# Patient Record
Sex: Male | Born: 1970
Health system: Southern US, Community
[De-identification: ages and names within clinical notes are randomized; demographics above are authoritative.]

## PROBLEM LIST (undated history)

## (undated) DIAGNOSIS — I639 Cerebral infarction, unspecified: Secondary | ICD-10-CM

## (undated) DIAGNOSIS — K219 Gastro-esophageal reflux disease without esophagitis: Secondary | ICD-10-CM

## (undated) DIAGNOSIS — G510 Bell's palsy: Secondary | ICD-10-CM

## (undated) DIAGNOSIS — I10 Essential (primary) hypertension: Secondary | ICD-10-CM

## (undated) DIAGNOSIS — F32A Depression, unspecified: Secondary | ICD-10-CM

## (undated) DIAGNOSIS — R29898 Other symptoms and signs involving the musculoskeletal system: Secondary | ICD-10-CM

## (undated) DIAGNOSIS — D649 Anemia, unspecified: Secondary | ICD-10-CM

## (undated) DIAGNOSIS — E785 Hyperlipidemia, unspecified: Secondary | ICD-10-CM

## (undated) DIAGNOSIS — R000 Tachycardia, unspecified: Secondary | ICD-10-CM

## (undated) DIAGNOSIS — G4700 Insomnia, unspecified: Secondary | ICD-10-CM

## (undated) DIAGNOSIS — F419 Anxiety disorder, unspecified: Secondary | ICD-10-CM

## (undated) DIAGNOSIS — M1990 Unspecified osteoarthritis, unspecified site: Secondary | ICD-10-CM

## (undated) DIAGNOSIS — I69354 Hemiplegia and hemiparesis following cerebral infarction affecting left non-dominant side: Secondary | ICD-10-CM

## (undated) DIAGNOSIS — N3281 Overactive bladder: Secondary | ICD-10-CM

## (undated) DIAGNOSIS — G250 Essential tremor: Secondary | ICD-10-CM

## (undated) DIAGNOSIS — R1310 Dysphagia, unspecified: Secondary | ICD-10-CM

## (undated) DIAGNOSIS — G8929 Other chronic pain: Secondary | ICD-10-CM

## (undated) DIAGNOSIS — G40909 Epilepsy, unspecified, not intractable, without status epilepticus: Secondary | ICD-10-CM

## (undated) DIAGNOSIS — G629 Polyneuropathy, unspecified: Secondary | ICD-10-CM

## (undated) DIAGNOSIS — Z87898 Personal history of other specified conditions: Secondary | ICD-10-CM

## (undated) DIAGNOSIS — N400 Enlarged prostate without lower urinary tract symptoms: Secondary | ICD-10-CM

## (undated) HISTORY — PX: OTHER SURGICAL HISTORY: SHX169

---

## 2021-01-14 ENCOUNTER — Ambulatory Visit (INDEPENDENT_AMBULATORY_CARE_PROVIDER_SITE_OTHER): Payer: Medicare Other | Admitting: Nurse Practitioner

## 2021-01-14 ENCOUNTER — Emergency Department (HOSPITAL_COMMUNITY)
Admission: EM | Admit: 2021-01-14 | Discharge: 2021-01-14 | Disposition: A | Discharge status: Home / Self Care | Payer: Medicare Other | Attending: Emergency Medicine | Admitting: Emergency Medicine

## 2021-01-14 ENCOUNTER — Emergency Department (HOSPITAL_COMMUNITY): Payer: Medicare Other

## 2021-01-14 ENCOUNTER — Encounter: Payer: Self-pay | Admitting: Nurse Practitioner

## 2021-01-14 ENCOUNTER — Encounter (HOSPITAL_COMMUNITY): Payer: Self-pay | Admitting: Emergency Medicine

## 2021-01-14 ENCOUNTER — Other Ambulatory Visit: Payer: Self-pay

## 2021-01-14 DIAGNOSIS — F17200 Nicotine dependence, unspecified, uncomplicated: Secondary | ICD-10-CM | POA: Insufficient documentation

## 2021-01-14 DIAGNOSIS — J3489 Other specified disorders of nose and nasal sinuses: Secondary | ICD-10-CM

## 2021-01-14 DIAGNOSIS — Z7689 Persons encountering health services in other specified circumstances: Secondary | ICD-10-CM | POA: Diagnosis not present

## 2021-01-14 DIAGNOSIS — S161XXA Strain of muscle, fascia and tendon at neck level, initial encounter: Secondary | ICD-10-CM | POA: Diagnosis not present

## 2021-01-14 DIAGNOSIS — I639 Cerebral infarction, unspecified: Secondary | ICD-10-CM

## 2021-01-14 DIAGNOSIS — Z139 Encounter for screening, unspecified: Secondary | ICD-10-CM | POA: Insufficient documentation

## 2021-01-14 DIAGNOSIS — W228XXA Striking against or struck by other objects, initial encounter: Secondary | ICD-10-CM | POA: Insufficient documentation

## 2021-01-14 DIAGNOSIS — G8929 Other chronic pain: Secondary | ICD-10-CM

## 2021-01-14 DIAGNOSIS — G40909 Epilepsy, unspecified, not intractable, without status epilepticus: Secondary | ICD-10-CM | POA: Diagnosis not present

## 2021-01-14 DIAGNOSIS — R269 Unspecified abnormalities of gait and mobility: Secondary | ICD-10-CM | POA: Insufficient documentation

## 2021-01-14 DIAGNOSIS — Z0001 Encounter for general adult medical examination with abnormal findings: Secondary | ICD-10-CM | POA: Insufficient documentation

## 2021-01-14 DIAGNOSIS — M25562 Pain in left knee: Secondary | ICD-10-CM

## 2021-01-14 DIAGNOSIS — S0093XA Contusion of unspecified part of head, initial encounter: Secondary | ICD-10-CM | POA: Insufficient documentation

## 2021-01-14 DIAGNOSIS — Y92009 Unspecified place in unspecified non-institutional (private) residence as the place of occurrence of the external cause: Secondary | ICD-10-CM | POA: Insufficient documentation

## 2021-01-14 DIAGNOSIS — S0990XA Unspecified injury of head, initial encounter: Secondary | ICD-10-CM | POA: Diagnosis not present

## 2021-01-14 DIAGNOSIS — S199XXA Unspecified injury of neck, initial encounter: Secondary | ICD-10-CM | POA: Diagnosis present

## 2021-01-14 DIAGNOSIS — R079 Chest pain, unspecified: Secondary | ICD-10-CM

## 2021-01-14 HISTORY — DX: Cerebral infarction, unspecified: I63.9

## 2021-01-14 MED ORDER — OXYCODONE-ACETAMINOPHEN 5-325 MG PO TABS
1.0000 | ORAL_TABLET | Freq: Once | ORAL | Status: AC
Start: 2021-01-14 — End: 2021-01-14
  Administered 2021-01-14: 1 via ORAL
  Filled 2021-01-14: qty 1

## 2021-01-14 NOTE — Assessment & Plan Note (Signed)
-  chronic rhinorrhea sniffing following brain surgery -he is requesting ENT referral to r/o mechanical etiology

## 2021-01-14 NOTE — Patient Instructions (Addendum)
Please have fasting labs drawn 2-3 days prior to your appointment so we can discuss the results during your office visit.  Please bring all medications to your next appointment for a reconciliation.

## 2021-01-14 NOTE — Assessment & Plan Note (Signed)
-  screen HCV and HIV with next set of labs

## 2021-01-14 NOTE — Assessment & Plan Note (Signed)
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
## 2021-01-14 NOTE — ED Notes (Signed)
Patient transported to CT 

## 2021-01-14 NOTE — Assessment & Plan Note (Signed)
-  no acute issues today -states he has angina every other day -referral to cardiology

## 2021-01-14 NOTE — Addendum Note (Signed)
Addended by: Demetrius Revel on: 01/14/2021 02:50 PM   Modules accepted: Orders

## 2021-01-14 NOTE — Assessment & Plan Note (Signed)
-  obtain records 

## 2021-01-14 NOTE — Assessment & Plan Note (Signed)
-  has contractures related to CVA -has pain to left knee and is requesting ortho referral
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
## 2021-01-14 NOTE — Assessment & Plan Note (Addendum)
-  no meds reported today; will need to bring meds for reconciliation -referral to neuro
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
## 2021-01-15 ENCOUNTER — Other Ambulatory Visit: Payer: Self-pay

## 2021-01-15 ENCOUNTER — Telehealth: Payer: Self-pay

## 2021-01-15 DIAGNOSIS — T402X5A Adverse effect of other opioids, initial encounter: Secondary | ICD-10-CM

## 2021-01-15 DIAGNOSIS — I152 Hypertension secondary to endocrine disorders: Secondary | ICD-10-CM

## 2021-01-15 DIAGNOSIS — F419 Anxiety disorder, unspecified: Secondary | ICD-10-CM

## 2021-01-15 DIAGNOSIS — G8929 Other chronic pain: Secondary | ICD-10-CM

## 2021-01-15 DIAGNOSIS — G40909 Epilepsy, unspecified, not intractable, without status epilepticus: Secondary | ICD-10-CM

## 2021-01-15 DIAGNOSIS — G4700 Insomnia, unspecified: Secondary | ICD-10-CM

## 2021-01-15 DIAGNOSIS — K5903 Drug induced constipation: Secondary | ICD-10-CM

## 2021-01-15 DIAGNOSIS — J3489 Other specified disorders of nose and nasal sinuses: Secondary | ICD-10-CM

## 2021-01-15 DIAGNOSIS — R110 Nausea: Secondary | ICD-10-CM

## 2021-01-15 DIAGNOSIS — K219 Gastro-esophageal reflux disease without esophagitis: Secondary | ICD-10-CM

## 2021-01-15 DIAGNOSIS — I639 Cerebral infarction, unspecified: Secondary | ICD-10-CM

## 2021-01-15 DIAGNOSIS — E1159 Type 2 diabetes mellitus with other circulatory complications: Secondary | ICD-10-CM

## 2021-01-15 MED ORDER — PROMETHAZINE HCL 12.5 MG PO TABS: 12.5000 mg | ORAL_TABLET | Freq: Three times a day (TID) | ORAL | 0 refills | Status: DC | PRN

## 2021-01-15 MED ORDER — LEVETIRACETAM 750 MG PO TABS: 750.0000 mg | ORAL_TABLET | Freq: Two times a day (BID) | ORAL | 3 refills | Status: DC

## 2021-01-15 MED ORDER — GABAPENTIN 100 MG PO CAPS: 100.0000 mg | ORAL_CAPSULE | Freq: Every day | ORAL | 3 refills | Status: DC

## 2021-01-15 MED ORDER — HYDROCHLOROTHIAZIDE 25 MG PO TABS: 25.0000 mg | ORAL_TABLET | Freq: Every day | ORAL | 3 refills | Status: DC

## 2021-01-15 MED ORDER — BUSPIRONE HCL 5 MG PO TABS: 5.0000 mg | ORAL_TABLET | Freq: Two times a day (BID) | ORAL | 3 refills | Status: DC

## 2021-01-15 MED ORDER — OMEPRAZOLE 20 MG PO CPDR: 20.0000 mg | DELAYED_RELEASE_CAPSULE | Freq: Every day | ORAL | 3 refills | Status: DC

## 2021-01-15 MED ORDER — HYDROXYZINE HCL 25 MG PO TABS: 25.0000 mg | ORAL_TABLET | Freq: Three times a day (TID) | ORAL | 3 refills | Status: DC

## 2021-01-15 MED ORDER — CELECOXIB 200 MG PO CAPS: 200.0000 mg | ORAL_CAPSULE | Freq: Every day | ORAL | 3 refills | Status: DC

## 2021-01-15 MED ORDER — BACLOFEN 10 MG PO TABS: 10.0000 mg | ORAL_TABLET | Freq: Three times a day (TID) | ORAL | 3 refills | Status: DC

## 2021-01-15 MED ORDER — DOCUSATE SODIUM 100 MG PO CAPS: 100.0000 mg | ORAL_CAPSULE | Freq: Every day | ORAL | 3 refills | Status: DC

## 2021-01-15 MED ORDER — TRAZODONE HCL 150 MG PO TABS: 150.0000 mg | ORAL_TABLET | Freq: Every day | ORAL | 3 refills | Status: DC

## 2021-01-15 MED ORDER — CETIRIZINE HCL 10 MG PO TABS: 10.0000 mg | ORAL_TABLET | Freq: Every day | ORAL | 3 refills | Status: DC

## 2021-01-15 NOTE — Telephone Encounter (Signed)
All rx's re-sent to [facility] including baclofen and phenergan. Informed pt brother of the pain management referral.

## 2021-01-15 NOTE — Telephone Encounter (Signed)
We use pain management for narcotic prescriptions, so I'll send in the referral for that.  What is the frequency of the baclofen and phenergan as well as the phenergan dosage?

## 2021-01-15 NOTE — Addendum Note (Signed)
Addended by: Demetrius Revel on: 01/15/2021 04:28 PM   Modules accepted: Orders

## 2021-01-15 NOTE — Telephone Encounter (Signed)
Patient brother called patient need med refills, none was given yesterday.  HYDROcodone-acetaminophen (NORCO/VICODIN) 5-325 MG tablet  baclofen (LIORESAL) 10 MG tablet  Phenergan  For nausea  Pharmacy: [facility]

## 2021-01-15 NOTE — Telephone Encounter (Signed)
All rx's re-sent to [facility]. Pt brother informed.

## 2021-01-15 NOTE — Telephone Encounter (Signed)
Patient brother called need more med refill  traZODone (DESYREL) 150 MG tablet   busPIRone (BUSPAR) 5 MG tablet   celecoxib (CELEBREX) 200 MG capsule   gabapentin (NEURONTIN) 100 MG capsule  levETIRAcetam (KEPPRA) 750 MG tablet   omeprazole (PRILOSEC) 20 MG capsule   hydrOXYzine (ATARAX/VISTARIL) 25 MG tablet   atorvastatin (LIPITOR) 40 MG tablet  docusate sodium (COLACE) 100 MG capsule  cetirizine (ZYRTEC) 10 MG tablet   Pharmarcy:  [place]

## 2021-01-18 ENCOUNTER — Ambulatory Visit
Admission: EM | Admit: 2021-01-18 | Discharge: 2021-01-18 | Discharge status: Against Medical Advice | Payer: Medicare Other

## 2021-01-18 ENCOUNTER — Other Ambulatory Visit: Payer: Self-pay

## 2021-01-28 ENCOUNTER — Encounter: Payer: Self-pay | Admitting: Physical Medicine & Rehabilitation

## 2021-01-29 DIAGNOSIS — Z139 Encounter for screening, unspecified: Secondary | ICD-10-CM | POA: Diagnosis not present

## 2021-01-29 DIAGNOSIS — Z7689 Persons encountering health services in other specified circumstances: Secondary | ICD-10-CM | POA: Diagnosis not present

## 2021-01-29 DIAGNOSIS — I639 Cerebral infarction, unspecified: Secondary | ICD-10-CM | POA: Diagnosis not present

## 2021-01-29 DIAGNOSIS — R079 Chest pain, unspecified: Secondary | ICD-10-CM | POA: Diagnosis not present

## 2021-01-30 LAB — CBC WITH DIFFERENTIAL/PLATELET
Basophils Absolute: 0 10*3/uL (ref 0.0–0.2)
Basos: 1 %
EOS (ABSOLUTE): 0.1 10*3/uL (ref 0.0–0.4)
Eos: 1 %
Hematocrit: 44.1 % (ref 37.5–51.0)
Hemoglobin: 14.7 g/dL (ref 13.0–17.7)
Immature Grans (Abs): 0 10*3/uL (ref 0.0–0.1)
Immature Granulocytes: 0 %
Lymphocytes Absolute: 2.2 10*3/uL (ref 0.7–3.1)
Lymphs: 29 %
MCH: 28.3 pg (ref 26.6–33.0)
MCHC: 33.3 g/dL (ref 31.5–35.7)
MCV: 85 fL (ref 79–97)
Monocytes Absolute: 0.6 10*3/uL (ref 0.1–0.9)
Monocytes: 8 %
Neutrophils Absolute: 4.7 10*3/uL (ref 1.4–7.0)
Neutrophils: 61 %
Platelets: 320 10*3/uL (ref 150–450)
RBC: 5.19 x10E6/uL (ref 4.14–5.80)
RDW: 13.9 % (ref 11.6–15.4)
WBC: 7.7 10*3/uL (ref 3.4–10.8)

## 2021-01-30 LAB — CMP14+EGFR
ALT: 14 IU/L (ref 0–44)
AST: 19 IU/L (ref 0–40)
Albumin/Globulin Ratio: 1.7 (ref 1.2–2.2)
Albumin: 4.5 g/dL (ref 3.8–4.9)
Alkaline Phosphatase: 138 IU/L — ABNORMAL HIGH (ref 44–121)
BUN/Creatinine Ratio: 16 (ref 9–20)
BUN: 12 mg/dL (ref 6–24)
Bilirubin Total: 0.3 mg/dL (ref 0.0–1.2)
CO2: 26 mmol/L (ref 20–29)
Calcium: 9.3 mg/dL (ref 8.7–10.2)
Chloride: 99 mmol/L (ref 96–106)
Creatinine, Ser: 0.75 mg/dL — ABNORMAL LOW (ref 0.76–1.27)
Globulin, Total: 2.7 g/dL (ref 1.5–4.5)
Glucose: 90 mg/dL (ref 65–99)
Potassium: 3.4 mmol/L — ABNORMAL LOW (ref 3.5–5.2)
Sodium: 141 mmol/L (ref 134–144)
Total Protein: 7.2 g/dL (ref 6.0–8.5)
eGFR: 109 mL/min/{1.73_m2} (ref >59)

## 2021-01-30 LAB — LIPID PANEL WITH LDL/HDL RATIO
Cholesterol, Total: 169 mg/dL (ref 100–199)
HDL: 40 mg/dL (ref >39)
LDL Chol Calc (NIH): 94 mg/dL (ref 0–99)
LDL/HDL Ratio: 2.4 ratio (ref 0.0–3.6)
Triglycerides: 202 mg/dL — ABNORMAL HIGH (ref 0–149)
VLDL Cholesterol Cal: 35 mg/dL (ref 5–40)

## 2021-01-30 LAB — HEPATITIS C ANTIBODY: Hep C Virus Ab: <0.1 s/co ratio (ref 0.0–0.9)

## 2021-01-30 NOTE — Progress Notes (Signed)
Potassium is a little low, and triglycerides are a little high, but we will discuss those at his appointment on 6/13.

## 2021-02-02 ENCOUNTER — Encounter: Payer: Self-pay | Admitting: Nurse Practitioner

## 2021-02-02 ENCOUNTER — Ambulatory Visit (INDEPENDENT_AMBULATORY_CARE_PROVIDER_SITE_OTHER): Payer: Medicare Other | Admitting: Nurse Practitioner

## 2021-02-02 ENCOUNTER — Other Ambulatory Visit: Payer: Self-pay

## 2021-02-02 VITALS — BP 156/93 | HR 102 | Temp 98.5°F | Resp 22 | Ht 74.0 in | Wt 229.0 lb

## 2021-02-02 DIAGNOSIS — K219 Gastro-esophageal reflux disease without esophagitis: Secondary | ICD-10-CM | POA: Diagnosis not present

## 2021-02-02 DIAGNOSIS — I639 Cerebral infarction, unspecified: Secondary | ICD-10-CM | POA: Diagnosis not present

## 2021-02-02 DIAGNOSIS — R21 Rash and other nonspecific skin eruption: Secondary | ICD-10-CM

## 2021-02-02 DIAGNOSIS — R269 Unspecified abnormalities of gait and mobility: Secondary | ICD-10-CM | POA: Diagnosis not present

## 2021-02-02 DIAGNOSIS — Z0001 Encounter for general adult medical examination with abnormal findings: Secondary | ICD-10-CM | POA: Diagnosis not present

## 2021-02-02 DIAGNOSIS — G8929 Other chronic pain: Secondary | ICD-10-CM | POA: Diagnosis not present

## 2021-02-02 DIAGNOSIS — G40909 Epilepsy, unspecified, not intractable, without status epilepticus: Secondary | ICD-10-CM

## 2021-02-02 DIAGNOSIS — G894 Chronic pain syndrome: Secondary | ICD-10-CM | POA: Diagnosis not present

## 2021-02-02 DIAGNOSIS — M25562 Pain in left knee: Secondary | ICD-10-CM

## 2021-02-02 DIAGNOSIS — Z139 Encounter for screening, unspecified: Secondary | ICD-10-CM | POA: Diagnosis not present

## 2021-02-02 MED ORDER — TRIAMCINOLONE ACETONIDE 0.1 % EX CREA: TOPICAL_CREAM | Freq: Two times a day (BID) | CUTANEOUS | 1 refills | Status: DC | PRN

## 2021-02-02 MED ORDER — BACLOFEN 10 MG PO TABS: 10.0000 mg | ORAL_TABLET | Freq: Three times a day (TID) | ORAL | 3 refills | Status: DC

## 2021-02-02 MED ORDER — OMEPRAZOLE 20 MG PO CPDR: 20.0000 mg | DELAYED_RELEASE_CAPSULE | Freq: Every day | ORAL | 3 refills | Status: DC

## 2021-02-02 NOTE — Assessment & Plan Note (Signed)
-  has itchy rash to left neck and back of neck -also had scaling rash to feet that have improved with his brother's OTC anti-fungal cream -Rx. Triamcinolone-ketoconazole cream
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
## 2021-02-02 NOTE — Assessment & Plan Note (Signed)
-  takes keppra -referred to neuro at last OV

## 2021-02-02 NOTE — Assessment & Plan Note (Signed)
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

## 2021-02-02 NOTE — Assessment & Plan Note (Signed)
-  he has paperwork for [facility] aid
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
## 2021-02-02 NOTE — Assessment & Plan Note (Signed)
-  left sided weakness after CVA -has left facial droop, and left arm and leg weakness post CVA

## 2021-02-02 NOTE — Patient Instructions (Signed)
We will meet up in a week to discuss the forms for the home health aid.  We will meet up for labs in 4 months.  Please have fasting labs drawn 2-3 days prior to your appointment so we can discuss the results during your office visit.

## 2021-02-04 ENCOUNTER — Ambulatory Visit: Payer: Medicare Other

## 2021-02-04 ENCOUNTER — Other Ambulatory Visit: Payer: Self-pay | Admitting: Orthopedic Surgery

## 2021-02-04 ENCOUNTER — Other Ambulatory Visit: Payer: Self-pay

## 2021-02-04 ENCOUNTER — Encounter: Payer: Self-pay | Admitting: Orthopedic Surgery

## 2021-02-04 ENCOUNTER — Telehealth: Payer: Self-pay

## 2021-02-04 ENCOUNTER — Ambulatory Visit: Payer: Medicare Other | Admitting: Orthopedic Surgery

## 2021-02-04 ENCOUNTER — Encounter: Payer: Self-pay | Admitting: Internal Medicine

## 2021-02-04 VITALS — BP 136/98 | HR 93 | Ht 74.0 in | Wt 229.0 lb

## 2021-02-04 DIAGNOSIS — G8929 Other chronic pain: Secondary | ICD-10-CM | POA: Diagnosis not present

## 2021-02-04 DIAGNOSIS — K219 Gastro-esophageal reflux disease without esophagitis: Secondary | ICD-10-CM

## 2021-02-04 DIAGNOSIS — M25562 Pain in left knee: Secondary | ICD-10-CM

## 2021-02-04 DIAGNOSIS — R269 Unspecified abnormalities of gait and mobility: Secondary | ICD-10-CM

## 2021-02-04 DIAGNOSIS — R21 Rash and other nonspecific skin eruption: Secondary | ICD-10-CM

## 2021-02-04 DIAGNOSIS — G894 Chronic pain syndrome: Secondary | ICD-10-CM

## 2021-02-04 MED ORDER — BACLOFEN 10 MG PO TABS: 10.0000 mg | ORAL_TABLET | Freq: Three times a day (TID) | ORAL | 3 refills | Status: DC

## 2021-02-04 MED ORDER — OMEPRAZOLE 20 MG PO CPDR: 20.0000 mg | DELAYED_RELEASE_CAPSULE | Freq: Every day | ORAL | 3 refills | Status: DC

## 2021-02-04 MED ORDER — TRIAMCINOLONE ACETONIDE 0.1 % EX CREA: TOPICAL_CREAM | Freq: Two times a day (BID) | CUTANEOUS | 1 refills | Status: DC | PRN

## 2021-02-04 NOTE — Progress Notes (Signed)
New Patient Visit  Assessment: Roger Morton is a 50 y.o. male with the following: 1. Chronic pain of left knee; left knee flexion contracture associated with history of stroke (x3)  Plan: Ms. Jaskiewicz has a complex medical history.  He is got limited mobility in his left side due to the strokes.  He states he has flexion contractures of both his left hip and his left knee.  He does walk with the assistance of a walker.  He states his biggest complaint at this time is pain in the left knee.  As result, I offered him a steroid injection and he is elected to proceed.  We have also placed a referral for physical therapy in an attempt to improve his mobility, and maintain range of motion of his hip and his knee.  Depending on his response to the left knee steroid injection, we could consider a left hip injection, but this would most likely be coordinated through the hospital since an image guided injection.  He stated his understanding.  All questions were answered and he is amenable to plan.  Follow-up as needed.   Procedure note injection Left knee joint   Verbal consent was obtained to inject the left knee joint  Timeout was completed to confirm the site of injection.  The skin was prepped with alcohol and ethyl chloride was sprayed at the injection site.  A 21-gauge needle was used to inject 40 mg of Depo-Medrol and 1% lidocaine (3 cc) into the left knee using an anterolateral approach.  There were no complications. A sterile bandage was applied.     Follow-up: Return if symptoms worsen or fail to improve.  Subjective:  Chief Complaint  Patient presents with   Knee Pain    Lt knee "contracture"    Hip Pain    Left side hip     History of Present Illness: Roger Morton is a 50 y.o. male who has been referred by Demetrius Revel, NP for evaluation of left knee pain.  Briefly, he has multiple medical comorbidities, and has had 3 prior strokes.  This is left him with deficits in the  left side.  Specifically, he has contractures of both the left hip, as well as the left knee.  As result, he has pain in both these areas specifically, he is complaining of pain in his left knee.  He has been on hydrocodone for many years, but due to a recent move to the area, he has not been established with a pain medicine provider.  He is now living with his brother, whereas he was previously staying in a nursing facility in [place].  No specific injury to the left knee.  He has not noticed any recent swelling.   Review of Systems: No fevers or chills No numbness or tingling No chest pain No shortness of breath No bowel or bladder dysfunction No GI distress No headaches   Medical History:  Past Medical History:  Diagnosis Date   Stroke [facility])    had brain tumor, and had CVAs during operation for his brain tumor    Past Surgical History:  Procedure Laterality Date   BRAIN SURGERY  2013   brain tumor excision   shunt intracranial x2      History reviewed. No pertinent family history. Social History   Tobacco Use   Smoking status: Every Day    Packs/day: 1.50    Years: 25.00    Pack years: 37.50    Types: Cigarettes  Smokeless tobacco: Never  Vaping Use   Vaping Use: Some days   Substances: Nicotine  Substance Use Topics   Alcohol use: Not Currently   Drug use: Not Currently    Allergies  Allergen Reactions   Penicillins     Current Meds  Medication Sig   atorvastatin (LIPITOR) 40 MG tablet Take 40 mg by mouth daily.   baclofen (LIORESAL) 10 MG tablet Take 1 tablet (10 mg total) by mouth 3 (three) times daily.   busPIRone (BUSPAR) 5 MG tablet Take 1 tablet (5 mg total) by mouth 2 (two) times daily.   celecoxib (CELEBREX) 200 MG capsule Take 1 capsule (200 mg total) by mouth daily.   cetirizine (ZYRTEC) 10 MG tablet Take 1 tablet (10 mg total) by mouth daily.   docusate sodium (COLACE) 100 MG capsule Take 1 capsule (100 mg total) by mouth daily.   gabapentin  (NEURONTIN) 100 MG capsule Take 1 capsule (100 mg total) by mouth at bedtime.   hydrochlorothiazide (HYDRODIURIL) 25 MG tablet Take 1 tablet (25 mg total) by mouth daily.   HYDROcodone-acetaminophen (NORCO/VICODIN) 5-325 MG tablet Take 1 tablet by mouth every 4 (four) hours as needed for severe pain.   hydrOXYzine (ATARAX/VISTARIL) 25 MG tablet Take 1 tablet (25 mg total) by mouth 3 (three) times daily.   ketoconazole 2%-triamcinolone 0.1% 1:2 cream mixture Apply topically 2 (two) times daily as needed.   levETIRAcetam (KEPPRA) 750 MG tablet Take 1 tablet (750 mg total) by mouth 2 (two) times daily.   omeprazole (PRILOSEC) 20 MG capsule Take 1 capsule (20 mg total) by mouth daily.   promethazine (PHENERGAN) 12.5 MG tablet Take 1 tablet (12.5 mg total) by mouth every 8 (eight) hours as needed for nausea or vomiting.   traZODone (DESYREL) 150 MG tablet Take 1 tablet (150 mg total) by mouth at bedtime.    Objective: BP (!) 136/98   Pulse 93   Ht 6\' 2"  (1.88 m)   Wt 229 lb (103.9 kg)   BMI 29.40 kg/m   Physical Exam:  General: Alert and oriented.  No acute distress.  Seated in wheelchair.  Evaluation of left leg demonstrates no swelling.  He is able to extend his leg, with an approximately 15 degree flexion contracture of the knee.  Limited passive motion of the left hip.  Mild effusion is appreciated in his knee.  Tenderness palpation along both the medial lateral joint line.  No overlying skin changes.  No bruising is appreciated around the knee.  No increased laxity to varus or valgus stress.  Negative Lachman.  Sensation is intact distally.  Active motion intact in the EHL/TA.  Atrophy of the quadriceps.    IMAGING: I personally ordered and reviewed the following images  X-rays left knee were obtained in clinic today and demonstrates no acute injuries.  Well-maintained joint spaces.  Flexion contractures evident on the x-rays.  Calcified matrix in the distal femur.  Impression: Normal  left knee x-ray.  Minimal degenerative changes.   New Medications:  No orders of the defined types were placed in this encounter.     Mordecai Rasmussen, MD  02/04/2021 12:14 PM

## 2021-02-04 NOTE — Telephone Encounter (Signed)
Patient brother called said the prescription was sent in to the wrong pharmacy. Please send to [facility] on [address] [place]  baclofen (LIORESAL) 10 MG tablet  omeprazole (PRILOSEC) 20 MG capsule   ketoconazole 2%-triamcinolone 0.1% 1:2 cream mixture

## 2021-02-04 NOTE — Patient Instructions (Signed)

## 2021-02-04 NOTE — Telephone Encounter (Signed)
Please refill these  

## 2021-02-04 NOTE — Telephone Encounter (Signed)
Rx's refilled. 

## 2021-02-09 ENCOUNTER — Telehealth: Payer: Self-pay

## 2021-02-09 ENCOUNTER — Ambulatory Visit (INDEPENDENT_AMBULATORY_CARE_PROVIDER_SITE_OTHER): Payer: Medicare Other | Admitting: Nurse Practitioner

## 2021-02-09 ENCOUNTER — Encounter: Payer: Self-pay | Admitting: Nurse Practitioner

## 2021-02-09 ENCOUNTER — Ambulatory Visit: Payer: Self-pay | Admitting: Nurse Practitioner

## 2021-02-09 ENCOUNTER — Other Ambulatory Visit: Payer: Self-pay

## 2021-02-09 ENCOUNTER — Other Ambulatory Visit: Payer: Self-pay | Admitting: Nurse Practitioner

## 2021-02-09 VITALS — BP 124/85 | HR 88 | Temp 97.9°F | Resp 20 | Ht 74.0 in | Wt 229.0 lb

## 2021-02-09 DIAGNOSIS — R531 Weakness: Secondary | ICD-10-CM | POA: Diagnosis not present

## 2021-02-09 DIAGNOSIS — Z139 Encounter for screening, unspecified: Secondary | ICD-10-CM | POA: Diagnosis not present

## 2021-02-09 DIAGNOSIS — R21 Rash and other nonspecific skin eruption: Secondary | ICD-10-CM

## 2021-02-09 MED ORDER — KETOCONAZOLE 2 % EX CREA: 1.0000 "application " | TOPICAL_CREAM | Freq: Every day | CUTANEOUS | 0 refills | Status: DC

## 2021-02-09 MED ORDER — TRIAMCINOLONE ACETONIDE 0.1 % EX CREA: 1.0000 "application " | TOPICAL_CREAM | Freq: Two times a day (BID) | CUTANEOUS | 0 refills | Status: DC

## 2021-02-09 NOTE — Progress Notes (Signed)
Acute Office Visit  Subjective:    Patient ID: Roger Morton, male    DOB: [birth year], 50 y.o.   MRN: [id number]  Chief Complaint  Patient presents with   Follow-up    Fill out request forms for personal care services.     HPI Patient is in today for eval for personal care services.  He has hx of multiple CVAs and is new to the area. He lives with his brother and would like assistance from a CNA to help with ADLs.  Past Medical History:  Diagnosis Date   Stroke [facility])    had brain tumor, and had CVAs during operation for his brain tumor    Past Surgical History:  Procedure Laterality Date   BRAIN SURGERY  2013   brain tumor excision   shunt intracranial x2      History reviewed. No pertinent family history.  Social History   Socioeconomic History   Marital status: Single    Spouse name: Not on file   Number of children: 3   Years of education: Not on file   Highest education level: Not on file  Occupational History   Occupation: Disabled  Tobacco Use   Smoking status: Every Day    Packs/day: 1.50    Years: 25.00    Pack years: 37.50    Types: Cigarettes   Smokeless tobacco: Never  Vaping Use   Vaping Use: Some days   Substances: Nicotine  Substance and Sexual Activity   Alcohol use: Not Currently   Drug use: Not Currently   Sexual activity: Not Currently  Other Topics Concern   Not on file  Social History Narrative   2 in [place], the other daughter may be in [place], [place]   Social Determinants of Health   Financial Resource Strain: Low Risk    Difficulty of Paying Living Expenses: Not hard at all  Food Insecurity: No Food Insecurity   Worried About Charity fundraiser in the Last Year: Never true   Arboriculturist in the Last Year: Never true  Transportation Needs: No Transportation Needs   Lack of Transportation (Medical): No   Lack of Transportation (Non-Medical): No  Physical Activity: Sufficiently Active   Days of Exercise per  Week: 7 days   Minutes of Exercise per Session: 30 min  Stress: No Stress Concern Present   Feeling of Stress : Not at all  Social Connections: Socially Isolated   Frequency of Communication with Friends and Family: Three times a week   Frequency of Social Gatherings with Friends and Family: Three times a week   Attends Religious Services: Never   Active Member of Clubs or Organizations: No   Attends Archivist Meetings: Never   Marital Status: Never married  Human resources officer Violence: Not At Risk   Fear of Current or Ex-Partner: No   Emotionally Abused: No   Physically Abused: No   Sexually Abused: No    Outpatient Medications Prior to Visit  Medication Sig Dispense Refill   atorvastatin (LIPITOR) 40 MG tablet Take 40 mg by mouth daily.     baclofen (LIORESAL) 10 MG tablet Take 1 tablet (10 mg total) by mouth 3 (three) times daily. 30 each 3   busPIRone (BUSPAR) 5 MG tablet Take 1 tablet (5 mg total) by mouth 2 (two) times daily. 60 tablet 3   celecoxib (CELEBREX) 200 MG capsule Take 1 capsule (200 mg total) by mouth daily. 30 capsule 3  cetirizine (ZYRTEC) 10 MG tablet Take 1 tablet (10 mg total) by mouth daily. 30 tablet 3   docusate sodium (COLACE) 100 MG capsule Take 1 capsule (100 mg total) by mouth daily. 10 capsule 3   gabapentin (NEURONTIN) 100 MG capsule Take 1 capsule (100 mg total) by mouth at bedtime. 30 capsule 3   hydrochlorothiazide (HYDRODIURIL) 25 MG tablet Take 1 tablet (25 mg total) by mouth daily. 30 tablet 3   HYDROcodone-acetaminophen (NORCO/VICODIN) 5-325 MG tablet Take 1 tablet by mouth every 4 (four) hours as needed for severe pain.     hydrOXYzine (ATARAX/VISTARIL) 25 MG tablet Take 1 tablet (25 mg total) by mouth 3 (three) times daily. 90 tablet 3   levETIRAcetam (KEPPRA) 750 MG tablet Take 1 tablet (750 mg total) by mouth 2 (two) times daily. 60 tablet 3   omeprazole (PRILOSEC) 20 MG capsule Take 1 capsule (20 mg total) by mouth daily. 30 capsule  3   promethazine (PHENERGAN) 12.5 MG tablet Take 1 tablet (12.5 mg total) by mouth every 8 (eight) hours as needed for nausea or vomiting. 20 tablet 0   traZODone (DESYREL) 150 MG tablet Take 1 tablet (150 mg total) by mouth at bedtime. 90 tablet 3   ketoconazole 2%-triamcinolone 0.1% 1:2 cream mixture Apply topically 2 (two) times daily as needed. (Patient not taking: Reported on 02/09/2021) 45 g 1   No facility-administered medications prior to visit.    Allergies  Allergen Reactions   Penicillins     Review of Systems     Objective:    Physical Exam  BP 124/85 (BP Location: Right Arm, Patient Position: Sitting, Cuff Size: Large)   Pulse 88   Temp 97.9 F (36.6 C)   Resp 20   Ht 6' 2" (1.88 m)   Wt 229 lb (103.9 kg)   SpO2 96%   BMI 29.40 kg/m  Wt Readings from Last 3 Encounters:  02/09/21 229 lb (103.9 kg)  02/04/21 229 lb (103.9 kg)  02/02/21 229 lb (103.9 kg)    Health Maintenance Due  Topic Date Due   COVID-19 Vaccine (1) Never done   Pneumococcal Vaccine 24-59 Years old (1 - PCV) Never done   URINE MICROALBUMIN  Never done   HIV Screening  Never done   TETANUS/TDAP  Never done   COLONOSCOPY (Pts 45-88yr Insurance coverage will need to be confirmed)  Never done   Zoster Vaccines- Shingrix (1 of 2) Never done    There are no preventive care reminders to display for this patient.   No results found for: TSH Lab Results  Component Value Date   WBC 7.7 01/29/2021   HGB 14.7 01/29/2021   HCT 44.1 01/29/2021   MCV 85 01/29/2021   PLT 320 01/29/2021   Lab Results  Component Value Date   NA 141 01/29/2021   K 3.4 (L) 01/29/2021   CO2 26 01/29/2021   GLUCOSE 90 01/29/2021   BUN 12 01/29/2021   CREATININE 0.75 (L) 01/29/2021   BILITOT 0.3 01/29/2021   ALKPHOS 138 (H) 01/29/2021   AST 19 01/29/2021   ALT 14 01/29/2021   PROT 7.2 01/29/2021   ALBUMIN 4.5 01/29/2021   CALCIUM 9.3 01/29/2021   EGFR 109 01/29/2021   Lab Results  Component Value Date    CHOL 169 01/29/2021   Lab Results  Component Value Date   HDL 40 01/29/2021   Lab Results  Component Value Date   LDLCALC 94 01/29/2021   Lab Results  Component  Value Date   TRIG 202 (H) 01/29/2021   No results found for: CHOLHDL No results found for: HGBA1C     Assessment & Plan:   Problem List Items Addressed This Visit       Nervous and Auditory   Left-sided weakness    -residual from CVA         Other   Screening due - Primary   Relevant Orders   PSA     No orders of the defined types were placed in this encounter.    Noreene Larsson, NP

## 2021-02-09 NOTE — Telephone Encounter (Signed)
This needs a PA. I will do PA unless there is something else you would like to call in to see if insurance will cover in the meantime.

## 2021-02-09 NOTE — Assessment & Plan Note (Signed)
-  residual from CVA

## 2021-02-09 NOTE — Telephone Encounter (Signed)
Brother is calling in to see if the antifungal cream was called in

## 2021-02-10 LAB — PSA: Prostate Specific Ag, Serum: 0.5 ng/mL (ref 0.0–4.0)

## 2021-02-10 NOTE — Telephone Encounter (Signed)
Left detailed message informing pt brother Gershon Mussel.

## 2021-02-10 NOTE — Progress Notes (Signed)
PSA is excellent, no risk of prostate cancer.

## 2021-02-14 ENCOUNTER — Emergency Department (HOSPITAL_COMMUNITY): Payer: Medicare Other

## 2021-02-14 ENCOUNTER — Inpatient Hospital Stay (HOSPITAL_COMMUNITY)
Admission: EM | Admit: 2021-02-14 | Discharge: 2021-02-20 | DRG: 482 | Disposition: A | Discharge status: Skilled Nursing Facility | Payer: Medicare Other | Attending: Internal Medicine | Admitting: Internal Medicine

## 2021-02-14 ENCOUNTER — Other Ambulatory Visit: Payer: Self-pay

## 2021-02-14 ENCOUNTER — Encounter (HOSPITAL_COMMUNITY): Payer: Self-pay | Admitting: *Deleted

## 2021-02-14 DIAGNOSIS — T402X5A Adverse effect of other opioids, initial encounter: Secondary | ICD-10-CM

## 2021-02-14 DIAGNOSIS — S42202A Unspecified fracture of upper end of left humerus, initial encounter for closed fracture: Secondary | ICD-10-CM | POA: Diagnosis not present

## 2021-02-14 DIAGNOSIS — Z791 Long term (current) use of non-steroidal anti-inflammatories (NSAID): Secondary | ICD-10-CM

## 2021-02-14 DIAGNOSIS — Z20822 Contact with and (suspected) exposure to covid-19: Secondary | ICD-10-CM | POA: Diagnosis not present

## 2021-02-14 DIAGNOSIS — S72022A Displaced fracture of epiphysis (separation) (upper) of left femur, initial encounter for closed fracture: Secondary | ICD-10-CM

## 2021-02-14 DIAGNOSIS — M25552 Pain in left hip: Secondary | ICD-10-CM | POA: Diagnosis present

## 2021-02-14 DIAGNOSIS — S72302D Unspecified fracture of shaft of left femur, subsequent encounter for closed fracture with routine healing: Secondary | ICD-10-CM | POA: Diagnosis not present

## 2021-02-14 DIAGNOSIS — N400 Enlarged prostate without lower urinary tract symptoms: Secondary | ICD-10-CM | POA: Diagnosis present

## 2021-02-14 DIAGNOSIS — I898 Other specified noninfective disorders of lymphatic vessels and lymph nodes: Secondary | ICD-10-CM | POA: Diagnosis present

## 2021-02-14 DIAGNOSIS — R0789 Other chest pain: Secondary | ICD-10-CM | POA: Diagnosis not present

## 2021-02-14 DIAGNOSIS — Z6829 Body mass index (BMI) 29.0-29.9, adult: Secondary | ICD-10-CM | POA: Diagnosis not present

## 2021-02-14 DIAGNOSIS — Z23 Encounter for immunization: Secondary | ICD-10-CM | POA: Diagnosis not present

## 2021-02-14 DIAGNOSIS — R531 Weakness: Secondary | ICD-10-CM | POA: Diagnosis not present

## 2021-02-14 DIAGNOSIS — Z8673 Personal history of transient ischemic attack (TIA), and cerebral infarction without residual deficits: Secondary | ICD-10-CM | POA: Diagnosis not present

## 2021-02-14 DIAGNOSIS — D72829 Elevated white blood cell count, unspecified: Secondary | ICD-10-CM | POA: Diagnosis not present

## 2021-02-14 DIAGNOSIS — J432 Centrilobular emphysema: Secondary | ICD-10-CM | POA: Diagnosis not present

## 2021-02-14 DIAGNOSIS — R269 Unspecified abnormalities of gait and mobility: Secondary | ICD-10-CM | POA: Diagnosis present

## 2021-02-14 DIAGNOSIS — S7290XA Unspecified fracture of unspecified femur, initial encounter for closed fracture: Secondary | ICD-10-CM | POA: Diagnosis present

## 2021-02-14 DIAGNOSIS — G479 Sleep disorder, unspecified: Secondary | ICD-10-CM | POA: Diagnosis not present

## 2021-02-14 DIAGNOSIS — R079 Chest pain, unspecified: Secondary | ICD-10-CM | POA: Diagnosis not present

## 2021-02-14 DIAGNOSIS — F17210 Nicotine dependence, cigarettes, uncomplicated: Secondary | ICD-10-CM | POA: Diagnosis not present

## 2021-02-14 DIAGNOSIS — S72142A Displaced intertrochanteric fracture of left femur, initial encounter for closed fracture: Secondary | ICD-10-CM | POA: Diagnosis not present

## 2021-02-14 DIAGNOSIS — Z4789 Encounter for other orthopedic aftercare: Secondary | ICD-10-CM | POA: Diagnosis not present

## 2021-02-14 DIAGNOSIS — M6281 Muscle weakness (generalized): Secondary | ICD-10-CM | POA: Diagnosis not present

## 2021-02-14 DIAGNOSIS — D649 Anemia, unspecified: Secondary | ICD-10-CM | POA: Diagnosis not present

## 2021-02-14 DIAGNOSIS — E876 Hypokalemia: Secondary | ICD-10-CM | POA: Diagnosis not present

## 2021-02-14 DIAGNOSIS — Y9301 Activity, walking, marching and hiking: Secondary | ICD-10-CM | POA: Diagnosis present

## 2021-02-14 DIAGNOSIS — M62838 Other muscle spasm: Secondary | ICD-10-CM | POA: Diagnosis not present

## 2021-02-14 DIAGNOSIS — E663 Overweight: Secondary | ICD-10-CM | POA: Diagnosis present

## 2021-02-14 DIAGNOSIS — Z720 Tobacco use: Secondary | ICD-10-CM | POA: Diagnosis not present

## 2021-02-14 DIAGNOSIS — J438 Other emphysema: Secondary | ICD-10-CM | POA: Diagnosis present

## 2021-02-14 DIAGNOSIS — M62462 Contracture of muscle, left lower leg: Secondary | ICD-10-CM | POA: Diagnosis not present

## 2021-02-14 DIAGNOSIS — M25562 Pain in left knee: Secondary | ICD-10-CM | POA: Diagnosis not present

## 2021-02-14 DIAGNOSIS — W19XXXA Unspecified fall, initial encounter: Secondary | ICD-10-CM | POA: Diagnosis not present

## 2021-02-14 DIAGNOSIS — K5900 Constipation, unspecified: Secondary | ICD-10-CM | POA: Diagnosis not present

## 2021-02-14 DIAGNOSIS — G629 Polyneuropathy, unspecified: Secondary | ICD-10-CM | POA: Diagnosis present

## 2021-02-14 DIAGNOSIS — R262 Difficulty in walking, not elsewhere classified: Secondary | ICD-10-CM | POA: Diagnosis not present

## 2021-02-14 DIAGNOSIS — Z743 Need for continuous supervision: Secondary | ICD-10-CM | POA: Diagnosis not present

## 2021-02-14 DIAGNOSIS — K219 Gastro-esophageal reflux disease without esophagitis: Secondary | ICD-10-CM | POA: Diagnosis present

## 2021-02-14 DIAGNOSIS — R911 Solitary pulmonary nodule: Secondary | ICD-10-CM | POA: Diagnosis not present

## 2021-02-14 DIAGNOSIS — I69392 Facial weakness following cerebral infarction: Secondary | ICD-10-CM | POA: Diagnosis not present

## 2021-02-14 DIAGNOSIS — G40909 Epilepsy, unspecified, not intractable, without status epilepticus: Secondary | ICD-10-CM | POA: Diagnosis present

## 2021-02-14 DIAGNOSIS — Y92008 Other place in unspecified non-institutional (private) residence as the place of occurrence of the external cause: Secondary | ICD-10-CM

## 2021-02-14 DIAGNOSIS — E785 Hyperlipidemia, unspecified: Secondary | ICD-10-CM | POA: Diagnosis not present

## 2021-02-14 DIAGNOSIS — Z79899 Other long term (current) drug therapy: Secondary | ICD-10-CM

## 2021-02-14 DIAGNOSIS — R0609 Other forms of dyspnea: Secondary | ICD-10-CM | POA: Diagnosis not present

## 2021-02-14 DIAGNOSIS — S72142D Displaced intertrochanteric fracture of left femur, subsequent encounter for closed fracture with routine healing: Secondary | ICD-10-CM | POA: Diagnosis not present

## 2021-02-14 DIAGNOSIS — E46 Unspecified protein-calorie malnutrition: Secondary | ICD-10-CM | POA: Diagnosis not present

## 2021-02-14 DIAGNOSIS — Z880 Allergy status to penicillin: Secondary | ICD-10-CM

## 2021-02-14 DIAGNOSIS — M62461 Contracture of muscle, right lower leg: Secondary | ICD-10-CM | POA: Diagnosis not present

## 2021-02-14 DIAGNOSIS — I10 Essential (primary) hypertension: Secondary | ICD-10-CM | POA: Diagnosis not present

## 2021-02-14 DIAGNOSIS — Z7401 Bed confinement status: Secondary | ICD-10-CM | POA: Diagnosis not present

## 2021-02-14 DIAGNOSIS — I700 Atherosclerosis of aorta: Secondary | ICD-10-CM | POA: Diagnosis present

## 2021-02-14 DIAGNOSIS — I69354 Hemiplegia and hemiparesis following cerebral infarction affecting left non-dominant side: Secondary | ICD-10-CM | POA: Diagnosis not present

## 2021-02-14 DIAGNOSIS — S79929A Unspecified injury of unspecified thigh, initial encounter: Secondary | ICD-10-CM | POA: Diagnosis not present

## 2021-02-14 DIAGNOSIS — R000 Tachycardia, unspecified: Secondary | ICD-10-CM | POA: Diagnosis not present

## 2021-02-14 DIAGNOSIS — R0602 Shortness of breath: Secondary | ICD-10-CM | POA: Diagnosis not present

## 2021-02-14 DIAGNOSIS — M6240 Contracture of muscle, unspecified site: Secondary | ICD-10-CM | POA: Diagnosis not present

## 2021-02-14 DIAGNOSIS — M24562 Contracture, left knee: Secondary | ICD-10-CM | POA: Diagnosis not present

## 2021-02-14 DIAGNOSIS — J3489 Other specified disorders of nose and nasal sinuses: Secondary | ICD-10-CM | POA: Diagnosis not present

## 2021-02-14 DIAGNOSIS — G8929 Other chronic pain: Secondary | ICD-10-CM | POA: Diagnosis not present

## 2021-02-14 DIAGNOSIS — S7292XA Unspecified fracture of left femur, initial encounter for closed fracture: Secondary | ICD-10-CM | POA: Diagnosis not present

## 2021-02-14 DIAGNOSIS — W010XXA Fall on same level from slipping, tripping and stumbling without subsequent striking against object, initial encounter: Secondary | ICD-10-CM | POA: Diagnosis present

## 2021-02-14 DIAGNOSIS — R52 Pain, unspecified: Secondary | ICD-10-CM

## 2021-02-14 DIAGNOSIS — M1612 Unilateral primary osteoarthritis, left hip: Secondary | ICD-10-CM | POA: Diagnosis not present

## 2021-02-14 HISTORY — DX: Gastro-esophageal reflux disease without esophagitis: K21.9

## 2021-02-14 HISTORY — DX: Benign prostatic hyperplasia without lower urinary tract symptoms: N40.0

## 2021-02-14 HISTORY — DX: Essential (primary) hypertension: I10

## 2021-02-14 HISTORY — DX: Epilepsy, unspecified, not intractable, without status epilepticus: G40.909

## 2021-02-14 HISTORY — DX: Polyneuropathy, unspecified: G62.9

## 2021-02-14 LAB — CBC WITH DIFFERENTIAL/PLATELET
Abs Immature Granulocytes: 0.08 10*3/uL — ABNORMAL HIGH (ref 0.00–0.07)
Basophils Absolute: 0 10*3/uL (ref 0.0–0.1)
Basophils Relative: 0 %
Eosinophils Absolute: 0 10*3/uL (ref 0.0–0.5)
Eosinophils Relative: 0 %
HCT: 41.5 % (ref 39.0–52.0)
Hemoglobin: 14 g/dL (ref 13.0–17.0)
Immature Granulocytes: 1 %
Lymphocytes Relative: 11 %
Lymphs Abs: 1.5 10*3/uL (ref 0.7–4.0)
MCH: 28.9 pg (ref 26.0–34.0)
MCHC: 33.7 g/dL (ref 30.0–36.0)
MCV: 85.7 fL (ref 80.0–100.0)
Monocytes Absolute: 0.9 10*3/uL (ref 0.1–1.0)
Monocytes Relative: 7 %
Neutro Abs: 11.2 10*3/uL — ABNORMAL HIGH (ref 1.7–7.7)
Neutrophils Relative %: 81 %
Platelets: 253 10*3/uL (ref 150–400)
RBC: 4.84 MIL/uL (ref 4.22–5.81)
RDW: 14.3 % (ref 11.5–15.5)
WBC: 13.7 10*3/uL — ABNORMAL HIGH (ref 4.0–10.5)
nRBC: 0 % (ref 0.0–0.2)

## 2021-02-14 LAB — COMPREHENSIVE METABOLIC PANEL
ALT: 14 U/L (ref 0–44)
AST: 18 U/L (ref 15–41)
Albumin: 3.6 g/dL (ref 3.5–5.0)
Alkaline Phosphatase: 70 U/L (ref 38–126)
Anion gap: 9 (ref 5–15)
BUN: 14 mg/dL (ref 6–20)
CO2: 28 mmol/L (ref 22–32)
Calcium: 9 mg/dL (ref 8.9–10.3)
Chloride: 104 mmol/L (ref 98–111)
Creatinine, Ser: 0.75 mg/dL (ref 0.61–1.24)
GFR, Estimated: >60 mL/min (ref >60)
Glucose, Bld: 106 mg/dL — ABNORMAL HIGH (ref 70–99)
Potassium: 3.3 mmol/L — ABNORMAL LOW (ref 3.5–5.1)
Sodium: 141 mmol/L (ref 135–145)
Total Bilirubin: 0.5 mg/dL (ref 0.3–1.2)
Total Protein: 6.4 g/dL — ABNORMAL LOW (ref 6.5–8.1)

## 2021-02-14 MED ORDER — HYDROMORPHONE HCL 1 MG/ML IJ SOLN
1.0000 mg | Freq: Once | INTRAMUSCULAR | Status: AC
  Administered 2021-02-14: 1 mg via INTRAMUSCULAR
  Filled 2021-02-14: qty 1

## 2021-02-14 MED ORDER — DEXAMETHASONE SODIUM PHOSPHATE 10 MG/ML IJ SOLN
8.0000 mg | Freq: Once | INTRAMUSCULAR | Status: AC
  Administered 2021-02-14: 8 mg via INTRAMUSCULAR
  Filled 2021-02-14: qty 1

## 2021-02-14 MED ORDER — MORPHINE SULFATE (PF) 4 MG/ML IV SOLN
4.0000 mg | Freq: Once | INTRAVENOUS | Status: AC
  Administered 2021-02-14: 4 mg via INTRAVENOUS
  Filled 2021-02-14: qty 1

## 2021-02-14 MED ORDER — ONDANSETRON HCL 4 MG/2ML IJ SOLN
4.0000 mg | Freq: Once | INTRAMUSCULAR | Status: AC
  Administered 2021-02-14: 4 mg via INTRAVENOUS
  Filled 2021-02-14: qty 2
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
## 2021-02-14 NOTE — ED Notes (Signed)
Patient transported to X-ray 

## 2021-02-14 NOTE — ED Provider Notes (Signed)
[facility] EMERGENCY DEPARTMENT Provider Note   CSN: [id number] Arrival date & time: 02/14/21  2053     History Chief Complaint  Patient presents with   Zollie Scale EMMETTE KATT is a 50 y.o. male past medical history of stroke, left-sided weakness who presents for evaluation of left hip, thigh pain.  Patient reports he was walking with his walker and states that he fell, landing on his left leg.  He did not hit his head or lose consciousness.  He states that since this happened, he has not been able to straighten out his leg.  He feels like something popped and since then has not been able to move it.  He does not have any numbness.  He denies any blood thinner use.  The history is provided by the patient.      Past Medical History:  Diagnosis Date   Stroke [facility])    had brain tumor, and had CVAs during operation for his brain tumor    Patient Active Problem List   Diagnosis Date Noted   Femur fracture ([place]) 02/15/2021   Leukocytosis 02/15/2021   Hyperlipidemia 02/15/2021   GERD (gastroesophageal reflux disease) 02/15/2021   Left-sided weakness 02/09/2021   Chronic pain 02/02/2021   Rash 02/02/2021   Encounter for general adult medical examination with abnormal findings 01/14/2021   Screening due 01/14/2021   Stroke ([place]) 01/14/2021   Seizure disorder ([place]) 01/14/2021   Chest pain 01/14/2021   Gait disorder 01/14/2021   Left knee pain 01/14/2021   Rhinorrhea 01/14/2021    Past Surgical History:  Procedure Laterality Date   BRAIN SURGERY  2013   brain tumor excision   shunt intracranial x2         History reviewed. No pertinent family history.  Social History   Tobacco Use   Smoking status: Every Day    Packs/day: 1.50    Years: 25.00    Pack years: 37.50    Types: Cigarettes   Smokeless tobacco: Never  Vaping Use   Vaping Use: Some days   Substances: Nicotine  Substance Use Topics   Alcohol use: Not Currently   Drug use: Not  Currently    Home Medications Prior to Admission medications   Medication Sig Start Date End Date Taking? Authorizing Provider  acetaminophen (TYLENOL) 325 MG tablet Take 650 mg by mouth daily.   Yes [provider]  atorvastatin (LIPITOR) 40 MG tablet Take 40 mg by mouth at bedtime.   Yes [provider]  baclofen (LIORESAL) 10 MG tablet Take 1 tablet (10 mg total) by mouth 3 (three) times daily. 02/04/21  Yes Noreene Larsson, NP  busPIRone (BUSPAR) 5 MG tablet Take 1 tablet (5 mg total) by mouth 2 (two) times daily. 01/15/21  Yes Noreene Larsson, NP  Carboxymethylcellul-Glycerin (CLEAR EYES FOR DRY EYES OP) Place 1 drop into the left eye in the morning and at bedtime.   Yes [provider]  celecoxib (CELEBREX) 200 MG capsule Take 1 capsule (200 mg total) by mouth daily. 01/15/21  Yes Noreene Larsson, NP  cetirizine (ZYRTEC) 10 MG tablet Take 1 tablet (10 mg total) by mouth daily. 01/15/21  Yes Noreene Larsson, NP  docusate sodium (COLACE) 100 MG capsule Take 1 capsule (100 mg total) by mouth daily. Patient taking differently: Take 100 mg by mouth 2 (two) times daily. 01/15/21  Yes Noreene Larsson, NP  gabapentin (NEURONTIN) 100 MG capsule Take 1 capsule (  100 mg total) by mouth at bedtime. 01/15/21  Yes Noreene Larsson, NP  hydrochlorothiazide (HYDRODIURIL) 25 MG tablet Take 1 tablet (25 mg total) by mouth daily. 01/15/21  Yes Noreene Larsson, NP  hydrOXYzine (ATARAX/VISTARIL) 25 MG tablet Take 1 tablet (25 mg total) by mouth 3 (three) times daily. 01/15/21  Yes Noreene Larsson, NP  levETIRAcetam (KEPPRA) 750 MG tablet Take 1 tablet (750 mg total) by mouth 2 (two) times daily. 01/15/21  Yes Noreene Larsson, NP  omeprazole (PRILOSEC) 20 MG capsule Take 1 capsule (20 mg total) by mouth daily. 02/04/21  Yes Noreene Larsson, NP  promethazine (PHENERGAN) 12.5 MG tablet Take 1 tablet (12.5 mg total) by mouth every 8 (eight) hours as needed for nausea or vomiting. 01/15/21  Yes Noreene Larsson,  NP  tamsulosin (FLOMAX) 0.4 MG CAPS capsule Take 0.4 mg by mouth in the morning and at bedtime. 08/20/20  Yes [provider]  traZODone (DESYREL) 150 MG tablet Take 1 tablet (150 mg total) by mouth at bedtime. 01/15/21  Yes Noreene Larsson, NP  triamcinolone cream (KENALOG) 0.1 % Apply 1 application topically 2 (two) times daily. 02/09/21  Yes Noreene Larsson, NP  ketoconazole (NIZORAL) 2 % cream Apply 1 application topically daily. 02/09/21   Noreene Larsson, NP    Allergies    Penicillins  Review of Systems   Review of Systems  Constitutional:  Negative for fever.  Gastrointestinal:  Negative for nausea and vomiting.  Musculoskeletal:  Negative for neck pain.       LLE pain  Neurological:  Negative for weakness, numbness and headaches.  All other systems reviewed and are negative.  Physical Exam Updated Vital Signs BP 131/83 (BP Location: Left Arm)   Pulse 97   Temp 99.2 F (37.3 C) (Oral)   Resp 17   Ht 6\' 2"  (1.88 m)   Wt 103.9 kg   SpO2 97%   BMI 29.40 kg/m   Physical Exam Vitals and nursing note reviewed.  Constitutional:      Appearance: He is well-developed.  HENT:     Head: Normocephalic and atraumatic.     Comments: No tenderness to palpation of skull. No deformities or crepitus noted. No open wounds, abrasions or lacerations.  Eyes:     General: No scleral icterus.       Right eye: No discharge.        Left eye: No discharge.     Conjunctiva/sclera: Conjunctivae normal.  Neck:     Comments: Full flexion/extension and lateral movement of neck fully intact. No bony midline tenderness. No deformities or crepitus.  Cardiovascular:     Pulses:          Dorsalis pedis pulses are 2+ on the right side and 2+ on the left side.  Pulmonary:     Effort: Pulmonary effort is normal.  Musculoskeletal:     Comments: Tenderness palpation noted diffusely to the left thigh.  No deformity or crepitus noted.  He is holding it in a flexed position.  When I try to extend  his left lower extremity, he grabs his thigh and states it is too painful.  There is no bony tenderness noted to the right knee.  No deformity or crepitus noted.  No bony tenderness noted to left tib-fib, left ankle, left foot.  He can wiggle all 5 toes of his left foot.  Skin:    General: Skin is warm and dry.  Capillary Refill: Capillary refill takes less than 2 seconds.     Comments: Good distal cap refill. LLE is not dusky in appearance or cool to touch.  Neurological:     Mental Status: He is alert.     Comments: Sensation intact along major nerve distributions of BLE.   Psychiatric:        Speech: Speech normal.        Behavior: Behavior normal.    ED Results / Procedures / Treatments   Labs (all labs ordered are listed, but only abnormal results are displayed) Labs Reviewed  SURGICAL PCR SCREEN - Abnormal; Notable for the following components:      Result Value   MRSA, PCR POSITIVE (*)    Staphylococcus aureus POSITIVE (*)    All other components within normal limits  COMPREHENSIVE METABOLIC PANEL - Abnormal; Notable for the following components:   Potassium 3.3 (*)    Glucose, Bld 106 (*)    Total Protein 6.4 (*)    All other components within normal limits  CBC WITH DIFFERENTIAL/PLATELET - Abnormal; Notable for the following components:   WBC 13.7 (*)    Neutro Abs 11.2 (*)    Abs Immature Granulocytes 0.08 (*)    All other components within normal limits  CBC - Abnormal; Notable for the following components:   WBC 11.3 (*)    All other components within normal limits  BASIC METABOLIC PANEL - Abnormal; Notable for the following components:   Glucose, Bld 140 (*)    All other components within normal limits  RESP PANEL BY RT-PCR (FLU A&B, COVID) ARPGX2  URINE CULTURE  MAGNESIUM  PHOSPHORUS  URINALYSIS, ROUTINE W REFLEX MICROSCOPIC  CBC  BASIC METABOLIC PANEL    EKG None  Radiology DG C-Arm 1-60 Min  Result Date: 02/15/2021 CLINICAL DATA:  Left hip  fracture repair. EXAM: DG C-ARM 1-60 MIN FLUOROSCOPY TIME:  Fluoroscopy Time:  1 minutes 41 seconds Radiation Exposure Index (if provided by the fluoroscopic device): 31.89 mGy Number of Acquired Spot Images: 2 COMPARISON:  February 14, 2021 FINDINGS: A gamma nail and intramedullary rod have been placed across the intertrochanteric fracture with improved alignment. A distal interlocking screw is noted. IMPRESSION: Left hip fracture repair as above. Electronically Signed   By: Dorise Bullion III M.D   On: 02/15/2021 13:06   DG HIP OPERATIVE UNILAT WITH PELVIS LEFT  Result Date: 02/15/2021 CLINICAL DATA:  Left hip fracture repair. EXAM: DG C-ARM 1-60 MIN FLUOROSCOPY TIME:  Fluoroscopy Time:  1 minutes 41 seconds Radiation Exposure Index (if provided by the fluoroscopic device): 31.89 mGy Number of Acquired Spot Images: 2 COMPARISON:  February 14, 2021 FINDINGS: A gamma nail and intramedullary rod have been placed across the intertrochanteric fracture with improved alignment. A distal interlocking screw is noted. IMPRESSION: Left hip fracture repair as above. Electronically Signed   By: Dorise Bullion III M.D   On: 02/15/2021 13:06   DG Hip Unilat W or Wo Pelvis 2-3 Views Left  Result Date: 02/15/2021 CLINICAL DATA:  Status post fall. EXAM: DG HIP (WITH OR WITHOUT PELVIS) 2-3V LEFT COMPARISON:  None. FINDINGS: An acute fracture deformity is seen extending through the inter trochanteric region of the proximal left femur. There is no evidence of dislocation. Moderate severity degenerative changes are seen involving both hips in the form of joint space narrowing and acetabular sclerosis. IMPRESSION: Acute fracture of the proximal left femur. Electronically Signed   By: Virgina Norfolk M.D.   On:  02/15/2021 00:32    Procedures Procedures   Medications Ordered in ED Medications  atorvastatin (LIPITOR) tablet 40 mg (40 mg Oral Given 02/15/21 2147)  busPIRone (BUSPAR) tablet 5 mg (5 mg Oral Given 02/15/21 2146)   gabapentin (NEURONTIN) capsule 100 mg (100 mg Oral Given 02/15/21 2146)  levETIRAcetam (KEPPRA) tablet 750 mg (750 mg Oral Given 02/15/21 2147)  pantoprazole (PROTONIX) EC tablet 40 mg (40 mg Oral Given 02/15/21 1229)  tamsulosin (FLOMAX) capsule 0.4 mg (0.4 mg Oral Given 02/15/21 1229)  LORazepam (ATIVAN) injection 2 mg ( Intravenous MAR Unhold 02/15/21 1155)  senna-docusate (Senokot-S) tablet 2 tablet (2 tablets Oral Given 02/15/21 2147)  baclofen (LIORESAL) tablet 10 mg (10 mg Oral Given 02/15/21 2147)  nicotine (NICODERM CQ - dosed in mg/24 hours) patch 14 mg (14 mg Transdermal Patch Applied 02/15/21 1229)  naloxone (NARCAN) injection 0.4 mg ( Intravenous MAR Unhold 02/15/21 1155)  mupirocin ointment (BACTROBAN) 2 % 1 application (1 application Nasal Given 02/15/21 2147)  aspirin EC tablet 325 mg (has no administration in time range)  ceFAZolin (ANCEF) IVPB 2g/100 mL premix (2 g Intravenous New Bag/Given 02/15/21 1742)  methocarbamol (ROBAXIN) tablet 500 mg (500 mg Oral Given 02/15/21 1842)    Or  methocarbamol (ROBAXIN) 500 mg in dextrose 5 % 50 mL IVPB ( Intravenous See Alternative 02/15/21 1842)  docusate sodium (COLACE) capsule 100 mg (100 mg Oral Given 02/15/21 2147)  polyethylene glycol (MIRALAX / GLYCOLAX) packet 17 g (has no administration in time range)  bisacodyl (DULCOLAX) suppository 10 mg (has no administration in time range)  ondansetron (ZOFRAN) tablet 4 mg (has no administration in time range)    Or  ondansetron (ZOFRAN) injection 4 mg (has no administration in time range)  metoCLOPramide (REGLAN) tablet 5-10 mg (has no administration in time range)    Or  metoCLOPramide (REGLAN) injection 5-10 mg (has no administration in time range)  menthol-cetylpyridinium (CEPACOL) lozenge 3 mg (has no administration in time range)    Or  phenol (CHLORASEPTIC) mouth spray 1 spray (has no administration in time range)  acetaminophen (TYLENOL) tablet 325-650 mg (has no administration in time  range)  oxyCODONE (Oxy IR/ROXICODONE) immediate release tablet 5-10 mg (10 mg Oral Given 02/15/21 2146)  HYDROmorphone (DILAUDID) injection 0.5-1 mg (1 mg Intravenous Given 02/15/21 1736)  fentaNYL (SUBLIMAZE) 100 MCG/2ML injection (has no administration in time range)  melatonin tablet 3 mg (3 mg Oral Given 02/15/21 2147)  HYDROmorphone (DILAUDID) injection 1 mg (1 mg Intramuscular Given 02/14/21 2215)  dexamethasone (DECADRON) injection 8 mg (8 mg Intramuscular Given 02/14/21 2215)  morphine 4 MG/ML injection 4 mg (4 mg Intravenous Given 02/14/21 2330)  ondansetron (ZOFRAN) injection 4 mg (4 mg Intravenous Given 02/14/21 2330)  morphine 4 MG/ML injection 4 mg (4 mg Intravenous Given 02/15/21 0133)  potassium chloride SA (KLOR-CON) CR tablet 40 mEq (40 mEq Oral Given 02/15/21 0200)  chlorhexidine (HIBICLENS) 4 % liquid 4 application (4 application Topical Given 02/15/21 0431)  povidone-iodine 10 % swab 2 application (2 application Topical Given 02/15/21 0854)  ceFAZolin (ANCEF) IVPB 2g/100 mL premix (2 g Intravenous Given 02/15/21 0945)  tranexamic acid (CYKLOKAPRON) IVPB 1,000 mg (1,000 mg Intravenous Given 02/15/21 1000)  chlorhexidine (PERIDEX) 0.12 % solution 15 mL (15 mLs Mouth/Throat Given 02/15/21 0854)    Or  MEDLINE mouth rinse ( Mouth Rinse See Alternative 02/15/21 0854)  tranexamic acid (CYKLOKAPRON) IVPB 1,000 mg (1,000 mg Intravenous New Bag/Given 02/15/21 1241)    ED Course  I have reviewed the  triage vital signs and the nursing notes.  Pertinent labs & imaging results that were available during my care of the patient were reviewed by me and considered in my medical decision making (see chart for details).    MDM Rules/Calculators/A&P                          50 year old male who presents for evaluation of left lower extremity pain.  Reports he was walking and states that he fell and landed on his left hip.  Reports that he heard a pop and now cannot straighten out his leg.  Did not hit  his head.  No LOC.  No numbness/weakness.  He is not on blood thinners.  On initial arrival, he is afebrile nontoxic-appearing.  Vital signs are stable.  On exam, he is holding in the left lower extremity in a flexed position.  He has diffuse tenderness mostly to the left thigh.  No deformity or crepitus noted.  When I try to extend the leg passively, he grabs his thigh and states that it hurts and will not let me fully extended out.  Question of this is MSK etiology versus tendon pathology versus fracture versus dislocation.  We will plan for x-rays.  Review of his records show that he follows with orthopedics (Dr. Amedeo Kinsman).  In his note, it does look like he gets contractures of both his left hip and his left knee.  I did discuss with the patient and he states he feels like this is different.  We will give him analgesics and reassess.  Patient signed out to Quincy Carnes, PA-C.   Portions of this note were generated with [employer]. Dictation errors may occur despite best attempts at proofreading.   Final Clinical Impression(s) / ED Diagnoses Final diagnoses:  Closed displaced fracture of proximal epiphysis of left femur, initial encounter [facility])    Rx / DC Orders ED Discharge Orders     None        Volanda Napoleon, PA-C 02/15/21 2308    Lucrezia Starch, MD 02/16/21 1100

## 2021-02-15 ENCOUNTER — Inpatient Hospital Stay (HOSPITAL_COMMUNITY): Payer: Medicare Other

## 2021-02-15 ENCOUNTER — Encounter (HOSPITAL_COMMUNITY)
Admission: EM | Disposition: A | Discharge status: Skilled Nursing Facility | Payer: Self-pay | Source: Home / Self Care | Attending: Family Medicine

## 2021-02-15 ENCOUNTER — Inpatient Hospital Stay (HOSPITAL_COMMUNITY): Payer: Medicare Other | Admitting: Certified Registered"

## 2021-02-15 ENCOUNTER — Encounter (HOSPITAL_COMMUNITY): Payer: Self-pay | Admitting: Internal Medicine

## 2021-02-15 DIAGNOSIS — R0789 Other chest pain: Secondary | ICD-10-CM | POA: Diagnosis not present

## 2021-02-15 DIAGNOSIS — R0602 Shortness of breath: Secondary | ICD-10-CM | POA: Diagnosis not present

## 2021-02-15 DIAGNOSIS — E876 Hypokalemia: Secondary | ICD-10-CM | POA: Diagnosis present

## 2021-02-15 DIAGNOSIS — W010XXA Fall on same level from slipping, tripping and stumbling without subsequent striking against object, initial encounter: Secondary | ICD-10-CM | POA: Diagnosis present

## 2021-02-15 DIAGNOSIS — Z8673 Personal history of transient ischemic attack (TIA), and cerebral infarction without residual deficits: Secondary | ICD-10-CM | POA: Diagnosis not present

## 2021-02-15 DIAGNOSIS — R079 Chest pain, unspecified: Secondary | ICD-10-CM | POA: Diagnosis not present

## 2021-02-15 DIAGNOSIS — G629 Polyneuropathy, unspecified: Secondary | ICD-10-CM | POA: Diagnosis present

## 2021-02-15 DIAGNOSIS — S7292XA Unspecified fracture of left femur, initial encounter for closed fracture: Secondary | ICD-10-CM

## 2021-02-15 DIAGNOSIS — I700 Atherosclerosis of aorta: Secondary | ICD-10-CM | POA: Diagnosis not present

## 2021-02-15 DIAGNOSIS — I10 Essential (primary) hypertension: Secondary | ICD-10-CM | POA: Diagnosis not present

## 2021-02-15 DIAGNOSIS — R0609 Other forms of dyspnea: Secondary | ICD-10-CM | POA: Diagnosis not present

## 2021-02-15 DIAGNOSIS — D649 Anemia, unspecified: Secondary | ICD-10-CM | POA: Diagnosis not present

## 2021-02-15 DIAGNOSIS — E785 Hyperlipidemia, unspecified: Secondary | ICD-10-CM

## 2021-02-15 DIAGNOSIS — D72829 Elevated white blood cell count, unspecified: Secondary | ICD-10-CM | POA: Diagnosis not present

## 2021-02-15 DIAGNOSIS — S72142A Displaced intertrochanteric fracture of left femur, initial encounter for closed fracture: Secondary | ICD-10-CM | POA: Diagnosis not present

## 2021-02-15 DIAGNOSIS — M25552 Pain in left hip: Secondary | ICD-10-CM | POA: Diagnosis present

## 2021-02-15 DIAGNOSIS — Y9301 Activity, walking, marching and hiking: Secondary | ICD-10-CM | POA: Diagnosis present

## 2021-02-15 DIAGNOSIS — J432 Centrilobular emphysema: Secondary | ICD-10-CM | POA: Diagnosis not present

## 2021-02-15 DIAGNOSIS — Y92008 Other place in unspecified non-institutional (private) residence as the place of occurrence of the external cause: Secondary | ICD-10-CM | POA: Diagnosis not present

## 2021-02-15 DIAGNOSIS — E663 Overweight: Secondary | ICD-10-CM | POA: Diagnosis present

## 2021-02-15 DIAGNOSIS — S72142D Displaced intertrochanteric fracture of left femur, subsequent encounter for closed fracture with routine healing: Secondary | ICD-10-CM | POA: Diagnosis not present

## 2021-02-15 DIAGNOSIS — S7290XA Unspecified fracture of unspecified femur, initial encounter for closed fracture: Secondary | ICD-10-CM | POA: Diagnosis present

## 2021-02-15 DIAGNOSIS — R911 Solitary pulmonary nodule: Secondary | ICD-10-CM | POA: Diagnosis not present

## 2021-02-15 DIAGNOSIS — Z20822 Contact with and (suspected) exposure to covid-19: Secondary | ICD-10-CM | POA: Diagnosis not present

## 2021-02-15 DIAGNOSIS — Z6829 Body mass index (BMI) 29.0-29.9, adult: Secondary | ICD-10-CM | POA: Diagnosis not present

## 2021-02-15 DIAGNOSIS — K219 Gastro-esophageal reflux disease without esophagitis: Secondary | ICD-10-CM | POA: Diagnosis present

## 2021-02-15 DIAGNOSIS — G40909 Epilepsy, unspecified, not intractable, without status epilepticus: Secondary | ICD-10-CM | POA: Diagnosis not present

## 2021-02-15 DIAGNOSIS — M24562 Contracture, left knee: Secondary | ICD-10-CM | POA: Diagnosis present

## 2021-02-15 DIAGNOSIS — K5900 Constipation, unspecified: Secondary | ICD-10-CM | POA: Diagnosis not present

## 2021-02-15 DIAGNOSIS — I898 Other specified noninfective disorders of lymphatic vessels and lymph nodes: Secondary | ICD-10-CM | POA: Diagnosis not present

## 2021-02-15 DIAGNOSIS — Z23 Encounter for immunization: Secondary | ICD-10-CM | POA: Diagnosis not present

## 2021-02-15 DIAGNOSIS — J438 Other emphysema: Secondary | ICD-10-CM | POA: Diagnosis not present

## 2021-02-15 DIAGNOSIS — R000 Tachycardia, unspecified: Secondary | ICD-10-CM | POA: Diagnosis not present

## 2021-02-15 DIAGNOSIS — F17210 Nicotine dependence, cigarettes, uncomplicated: Secondary | ICD-10-CM | POA: Diagnosis present

## 2021-02-15 DIAGNOSIS — N400 Enlarged prostate without lower urinary tract symptoms: Secondary | ICD-10-CM | POA: Diagnosis not present

## 2021-02-15 HISTORY — PX: INTRAMEDULLARY (IM) NAIL INTERTROCHANTERIC: SHX5875

## 2021-02-15 LAB — CBC
HCT: 41.7 % (ref 39.0–52.0)
Hemoglobin: 13.8 g/dL (ref 13.0–17.0)
MCH: 28.4 pg (ref 26.0–34.0)
MCHC: 33.1 g/dL (ref 30.0–36.0)
MCV: 85.8 fL (ref 80.0–100.0)
Platelets: 263 10*3/uL (ref 150–400)
RBC: 4.86 MIL/uL (ref 4.22–5.81)
RDW: 14.2 % (ref 11.5–15.5)
WBC: 11.3 10*3/uL — ABNORMAL HIGH (ref 4.0–10.5)
nRBC: 0 % (ref 0.0–0.2)

## 2021-02-15 LAB — RESP PANEL BY RT-PCR (FLU A&B, COVID) ARPGX2
Influenza A by PCR: NEGATIVE
Influenza B by PCR: NEGATIVE
SARS Coronavirus 2 by RT PCR: NEGATIVE

## 2021-02-15 LAB — BASIC METABOLIC PANEL
Anion gap: 10 (ref 5–15)
BUN: 15 mg/dL (ref 6–20)
CO2: 24 mmol/L (ref 22–32)
Calcium: 9 mg/dL (ref 8.9–10.3)
Chloride: 104 mmol/L (ref 98–111)
Creatinine, Ser: 0.64 mg/dL (ref 0.61–1.24)
GFR, Estimated: >60 mL/min (ref >60)
Glucose, Bld: 140 mg/dL — ABNORMAL HIGH (ref 70–99)
Potassium: 3.5 mmol/L (ref 3.5–5.1)
Sodium: 138 mmol/L (ref 135–145)

## 2021-02-15 LAB — PHOSPHORUS: Phosphorus: 2.9 mg/dL (ref 2.5–4.6)

## 2021-02-15 LAB — SURGICAL PCR SCREEN
MRSA, PCR: POSITIVE — AB
Staphylococcus aureus: POSITIVE — AB

## 2021-02-15 LAB — MAGNESIUM: Magnesium: 1.7 mg/dL (ref 1.7–2.4)

## 2021-02-15 SURGERY — FIXATION, FRACTURE, INTERTROCHANTERIC, WITH INTRAMEDULLARY ROD
Anesthesia: General | Site: Hip | Laterality: Left

## 2021-02-15 MED ORDER — BACLOFEN 10 MG PO TABS
10.0000 mg | ORAL_TABLET | Freq: Three times a day (TID) | ORAL | Status: DC
  Administered 2021-02-15 – 2021-02-20 (×15): 10 mg via ORAL
  Filled 2021-02-15 (×15): qty 1

## 2021-02-15 MED ORDER — POLYETHYLENE GLYCOL 3350 17 G PO PACK: 17.0000 g | PACK | Freq: Every day | ORAL | Status: DC | PRN

## 2021-02-15 MED ORDER — POTASSIUM CHLORIDE CRYS ER 20 MEQ PO TBCR
40.0000 meq | EXTENDED_RELEASE_TABLET | Freq: Once | ORAL | Status: AC
  Administered 2021-02-15: 40 meq via ORAL
  Filled 2021-02-15: qty 2

## 2021-02-15 MED ORDER — HYDROMORPHONE HCL 1 MG/ML IJ SOLN
0.5000 mg | INTRAMUSCULAR | Status: DC | PRN
  Administered 2021-02-15 – 2021-02-20 (×18): 1 mg via INTRAVENOUS
  Filled 2021-02-15 (×18): qty 1

## 2021-02-15 MED ORDER — ROCURONIUM BROMIDE 10 MG/ML (PF) SYRINGE
PREFILLED_SYRINGE | INTRAVENOUS | Status: DC | PRN
  Administered 2021-02-15: 60 mg via INTRAVENOUS

## 2021-02-15 MED ORDER — LEVETIRACETAM 750 MG PO TABS
750.0000 mg | ORAL_TABLET | Freq: Two times a day (BID) | ORAL | Status: DC
  Administered 2021-02-15 – 2021-02-20 (×11): 750 mg via ORAL
  Filled 2021-02-15 (×13): qty 1

## 2021-02-15 MED ORDER — ATORVASTATIN CALCIUM 40 MG PO TABS
40.0000 mg | ORAL_TABLET | Freq: Every day | ORAL | Status: DC
  Administered 2021-02-15 – 2021-02-19 (×5): 40 mg via ORAL
  Filled 2021-02-15 (×5): qty 1

## 2021-02-15 MED ORDER — TAMSULOSIN HCL 0.4 MG PO CAPS
0.4000 mg | ORAL_CAPSULE | Freq: Every day | ORAL | Status: DC
  Administered 2021-02-15 – 2021-02-20 (×6): 0.4 mg via ORAL
  Filled 2021-02-15 (×6): qty 1

## 2021-02-15 MED ORDER — MUPIROCIN 2 % EX OINT
1.0000 "application " | TOPICAL_OINTMENT | Freq: Two times a day (BID) | CUTANEOUS | Status: AC
  Administered 2021-02-15 – 2021-02-20 (×10): 1 via NASAL
  Filled 2021-02-15 (×5): qty 22

## 2021-02-15 MED ORDER — MORPHINE SULFATE (PF) 4 MG/ML IV SOLN
4.0000 mg | Freq: Once | INTRAVENOUS | Status: AC
  Administered 2021-02-15: 4 mg via INTRAVENOUS
  Filled 2021-02-15: qty 1

## 2021-02-15 MED ORDER — ONDANSETRON HCL 4 MG/2ML IJ SOLN
INTRAMUSCULAR | Status: DC | PRN
  Administered 2021-02-15: 4 mg via INTRAVENOUS

## 2021-02-15 MED ORDER — METOCLOPRAMIDE HCL 5 MG/ML IJ SOLN
5.0000 mg | Freq: Three times a day (TID) | INTRAMUSCULAR | Status: DC | PRN
  Administered 2021-02-19: 5 mg via INTRAVENOUS
  Filled 2021-02-15: qty 2

## 2021-02-15 MED ORDER — ORAL CARE MOUTH RINSE: 15.0000 mL | Freq: Once | OROMUCOSAL | Status: AC

## 2021-02-15 MED ORDER — PHENOL 1.4 % MT LIQD: 1.0000 | OROMUCOSAL | Status: DC | PRN

## 2021-02-15 MED ORDER — ONDANSETRON HCL 4 MG PO TABS
4.0000 mg | ORAL_TABLET | Freq: Four times a day (QID) | ORAL | Status: DC | PRN
  Administered 2021-02-20: 4 mg via ORAL
  Filled 2021-02-15: qty 1

## 2021-02-15 MED ORDER — LIDOCAINE 2% (20 MG/ML) 5 ML SYRINGE
INTRAMUSCULAR | Status: AC
  Filled 2021-02-15: qty 5

## 2021-02-15 MED ORDER — PHENYLEPHRINE 40 MCG/ML (10ML) SYRINGE FOR IV PUSH (FOR BLOOD PRESSURE SUPPORT)
PREFILLED_SYRINGE | INTRAVENOUS | Status: DC | PRN
  Administered 2021-02-15: 120 ug via INTRAVENOUS

## 2021-02-15 MED ORDER — BISACODYL 10 MG RE SUPP
10.0000 mg | Freq: Every day | RECTAL | Status: DC | PRN
  Administered 2021-02-18: 10 mg via RECTAL
  Filled 2021-02-15: qty 1

## 2021-02-15 MED ORDER — ACETAMINOPHEN 325 MG PO TABS: 325.0000 mg | ORAL_TABLET | Freq: Four times a day (QID) | ORAL | Status: DC | PRN

## 2021-02-15 MED ORDER — DEXAMETHASONE SODIUM PHOSPHATE 10 MG/ML IJ SOLN
INTRAMUSCULAR | Status: DC | PRN
  Administered 2021-02-15: 10 mg via INTRAVENOUS

## 2021-02-15 MED ORDER — SUGAMMADEX SODIUM 200 MG/2ML IV SOLN
INTRAVENOUS | Status: DC | PRN
  Administered 2021-02-15: 200 mg via INTRAVENOUS

## 2021-02-15 MED ORDER — PROPOFOL 10 MG/ML IV BOLUS
INTRAVENOUS | Status: AC
  Filled 2021-02-15: qty 20

## 2021-02-15 MED ORDER — PANTOPRAZOLE SODIUM 40 MG PO TBEC
40.0000 mg | DELAYED_RELEASE_TABLET | Freq: Every day | ORAL | Status: DC
  Administered 2021-02-15 – 2021-02-20 (×6): 40 mg via ORAL
  Filled 2021-02-15 (×6): qty 1

## 2021-02-15 MED ORDER — 0.9 % SODIUM CHLORIDE (POUR BTL) OPTIME
TOPICAL | Status: DC | PRN
  Administered 2021-02-15: 1000 mL

## 2021-02-15 MED ORDER — GABAPENTIN 100 MG PO CAPS
100.0000 mg | ORAL_CAPSULE | Freq: Every day | ORAL | Status: DC
  Administered 2021-02-15 – 2021-02-19 (×5): 100 mg via ORAL
  Filled 2021-02-15 (×5): qty 1

## 2021-02-15 MED ORDER — SENNOSIDES-DOCUSATE SODIUM 8.6-50 MG PO TABS
2.0000 | ORAL_TABLET | Freq: Every day | ORAL | Status: DC
  Administered 2021-02-15 – 2021-02-19 (×5): 2 via ORAL
  Filled 2021-02-15 (×5): qty 2

## 2021-02-15 MED ORDER — OXYCODONE HCL 5 MG PO TABS
5.0000 mg | ORAL_TABLET | Freq: Four times a day (QID) | ORAL | Status: DC | PRN
  Administered 2021-02-15: 5 mg via ORAL
  Filled 2021-02-15: qty 1

## 2021-02-15 MED ORDER — CHLORHEXIDINE GLUCONATE 0.12 % MT SOLN: 15.0000 mL | Freq: Once | OROMUCOSAL | Status: AC

## 2021-02-15 MED ORDER — NICOTINE 14 MG/24HR TD PT24
14.0000 mg | MEDICATED_PATCH | Freq: Every day | TRANSDERMAL | Status: DC
  Administered 2021-02-15 – 2021-02-20 (×6): 14 mg via TRANSDERMAL
  Filled 2021-02-15 (×6): qty 1

## 2021-02-15 MED ORDER — METHOCARBAMOL 500 MG PO TABS
500.0000 mg | ORAL_TABLET | Freq: Four times a day (QID) | ORAL | Status: DC | PRN
  Administered 2021-02-15 – 2021-02-20 (×11): 500 mg via ORAL
  Filled 2021-02-15 (×11): qty 1

## 2021-02-15 MED ORDER — TRANEXAMIC ACID-NACL 1000-0.7 MG/100ML-% IV SOLN
1000.0000 mg | Freq: Once | INTRAVENOUS | Status: AC
  Administered 2021-02-15: 1000 mg via INTRAVENOUS
  Filled 2021-02-15: qty 100

## 2021-02-15 MED ORDER — DOCUSATE SODIUM 100 MG PO CAPS
100.0000 mg | ORAL_CAPSULE | Freq: Two times a day (BID) | ORAL | Status: DC
  Administered 2021-02-15 – 2021-02-20 (×9): 100 mg via ORAL
  Filled 2021-02-15 (×10): qty 1

## 2021-02-15 MED ORDER — METOCLOPRAMIDE HCL 5 MG PO TABS: 5.0000 mg | ORAL_TABLET | Freq: Three times a day (TID) | ORAL | Status: DC | PRN

## 2021-02-15 MED ORDER — ROCURONIUM BROMIDE 10 MG/ML (PF) SYRINGE
PREFILLED_SYRINGE | INTRAVENOUS | Status: AC
  Filled 2021-02-15: qty 10

## 2021-02-15 MED ORDER — POVIDONE-IODINE 10 % EX SWAB
2.0000 "application " | Freq: Once | CUTANEOUS | Status: AC
  Administered 2021-02-15: 2 via TOPICAL

## 2021-02-15 MED ORDER — FENTANYL CITRATE (PF) 100 MCG/2ML IJ SOLN
INTRAMUSCULAR | Status: AC
  Filled 2021-02-15: qty 2

## 2021-02-15 MED ORDER — ACETAMINOPHEN 325 MG PO TABS: 650.0000 mg | ORAL_TABLET | Freq: Four times a day (QID) | ORAL | Status: DC | PRN

## 2021-02-15 MED ORDER — MELATONIN 3 MG PO TABS
3.0000 mg | ORAL_TABLET | Freq: Every day | ORAL | Status: DC
  Administered 2021-02-15 – 2021-02-19 (×5): 3 mg via ORAL
  Filled 2021-02-15 (×5): qty 1

## 2021-02-15 MED ORDER — LIDOCAINE 2% (20 MG/ML) 5 ML SYRINGE
INTRAMUSCULAR | Status: DC | PRN
  Administered 2021-02-15: 80 mg via INTRAVENOUS

## 2021-02-15 MED ORDER — ASPIRIN EC 325 MG PO TBEC: 325.0000 mg | DELAYED_RELEASE_TABLET | Freq: Every day | ORAL | Status: DC

## 2021-02-15 MED ORDER — HYDROMORPHONE HCL 1 MG/ML IJ SOLN
1.0000 mg | INTRAMUSCULAR | Status: DC | PRN
  Administered 2021-02-15: 1 mg via INTRAMUSCULAR
  Filled 2021-02-15: qty 1

## 2021-02-15 MED ORDER — CEFAZOLIN SODIUM-DEXTROSE 2-4 GM/100ML-% IV SOLN
2.0000 g | Freq: Four times a day (QID) | INTRAVENOUS | Status: AC
  Administered 2021-02-15 – 2021-02-16 (×2): 2 g via INTRAVENOUS
  Filled 2021-02-15 (×2): qty 100

## 2021-02-15 MED ORDER — LORAZEPAM 2 MG/ML IJ SOLN: 2.0000 mg | Freq: Four times a day (QID) | INTRAMUSCULAR | Status: DC | PRN

## 2021-02-15 MED ORDER — NALOXONE HCL 0.4 MG/ML IJ SOLN: 0.4000 mg | INTRAMUSCULAR | Status: DC | PRN

## 2021-02-15 MED ORDER — OXYCODONE HCL 5 MG PO TABS
5.0000 mg | ORAL_TABLET | ORAL | Status: DC | PRN
  Administered 2021-02-15 – 2021-02-20 (×15): 10 mg via ORAL
  Filled 2021-02-15 (×15): qty 2

## 2021-02-15 MED ORDER — ONDANSETRON HCL 4 MG/2ML IJ SOLN
4.0000 mg | Freq: Four times a day (QID) | INTRAMUSCULAR | Status: DC | PRN
  Administered 2021-02-19: 4 mg via INTRAVENOUS
  Filled 2021-02-15: qty 2

## 2021-02-15 MED ORDER — METHOCARBAMOL 1000 MG/10ML IJ SOLN
500.0000 mg | Freq: Four times a day (QID) | INTRAVENOUS | Status: DC | PRN
  Filled 2021-02-15: qty 5

## 2021-02-15 MED ORDER — LACTATED RINGERS IV SOLN: INTRAVENOUS | Status: DC

## 2021-02-15 MED ORDER — FENTANYL CITRATE (PF) 250 MCG/5ML IJ SOLN
INTRAMUSCULAR | Status: DC | PRN
  Administered 2021-02-15 (×5): 50 ug via INTRAVENOUS
  Administered 2021-02-15: 100 ug via INTRAVENOUS

## 2021-02-15 MED ORDER — FENTANYL CITRATE (PF) 250 MCG/5ML IJ SOLN
INTRAMUSCULAR | Status: AC
  Filled 2021-02-15: qty 5

## 2021-02-15 MED ORDER — PROMETHAZINE HCL 25 MG/ML IJ SOLN: 6.2500 mg | INTRAMUSCULAR | Status: DC | PRN

## 2021-02-15 MED ORDER — CEFAZOLIN SODIUM-DEXTROSE 2-4 GM/100ML-% IV SOLN
2.0000 g | INTRAVENOUS | Status: AC
  Administered 2021-02-15: 2 g via INTRAVENOUS
  Filled 2021-02-15 (×2): qty 100

## 2021-02-15 MED ORDER — PROPOFOL 10 MG/ML IV BOLUS
INTRAVENOUS | Status: DC | PRN
  Administered 2021-02-15: 130 mg via INTRAVENOUS

## 2021-02-15 MED ORDER — FENTANYL CITRATE (PF) 100 MCG/2ML IJ SOLN
25.0000 ug | INTRAMUSCULAR | Status: DC | PRN
  Administered 2021-02-15 (×2): 50 ug via INTRAVENOUS

## 2021-02-15 MED ORDER — MIDAZOLAM HCL 2 MG/2ML IJ SOLN
INTRAMUSCULAR | Status: DC | PRN
  Administered 2021-02-15: 2 mg via INTRAVENOUS

## 2021-02-15 MED ORDER — CHLORHEXIDINE GLUCONATE 0.12 % MT SOLN
OROMUCOSAL | Status: AC
  Administered 2021-02-15: 15 mL via OROMUCOSAL
  Filled 2021-02-15: qty 15

## 2021-02-15 MED ORDER — CHLORHEXIDINE GLUCONATE 4 % EX LIQD
60.0000 mL | Freq: Once | CUTANEOUS | Status: AC
  Administered 2021-02-15: 4 via TOPICAL

## 2021-02-15 MED ORDER — DEXAMETHASONE SODIUM PHOSPHATE 10 MG/ML IJ SOLN
INTRAMUSCULAR | Status: AC
  Filled 2021-02-15: qty 1

## 2021-02-15 MED ORDER — MENTHOL 3 MG MT LOZG: 1.0000 | LOZENGE | OROMUCOSAL | Status: DC | PRN

## 2021-02-15 MED ORDER — TRANEXAMIC ACID-NACL 1000-0.7 MG/100ML-% IV SOLN
1000.0000 mg | INTRAVENOUS | Status: AC
  Administered 2021-02-15: 1000 mg via INTRAVENOUS
  Filled 2021-02-15 (×2): qty 100

## 2021-02-15 MED ORDER — MIDAZOLAM HCL 2 MG/2ML IJ SOLN
INTRAMUSCULAR | Status: AC
  Filled 2021-02-15: qty 2

## 2021-02-15 MED ORDER — BUSPIRONE HCL 5 MG PO TABS
5.0000 mg | ORAL_TABLET | Freq: Two times a day (BID) | ORAL | Status: DC
  Administered 2021-02-15 – 2021-02-20 (×10): 5 mg via ORAL
  Filled 2021-02-15 (×10): qty 1

## 2021-02-15 MED ORDER — ONDANSETRON HCL 4 MG/2ML IJ SOLN
INTRAMUSCULAR | Status: AC
  Filled 2021-02-15: qty 2

## 2021-02-15 SURGICAL SUPPLY — 42 items
BIT DRILL CANN LG 4.3MM (BIT) IMPLANT
COVER PERINEAL POST (MISCELLANEOUS) ×3 IMPLANT
COVER SURGICAL LIGHT HANDLE (MISCELLANEOUS) ×3 IMPLANT
COVER WAND RF STERILE (DRAPES) ×3 IMPLANT
DERMABOND ADVANCED (GAUZE/BANDAGES/DRESSINGS) ×2
DERMABOND ADVANCED .7 DNX12 (GAUZE/BANDAGES/DRESSINGS) ×1 IMPLANT
DRAPE STERI IOBAN 125X83 (DRAPES) ×3 IMPLANT
DRESSING AQUACEL AG SP 3.5X6 (GAUZE/BANDAGES/DRESSINGS) IMPLANT
DRILL BIT CANN LG 4.3MM (BIT) ×3 IMPLANT
DRSG AQUACEL AG SP 3.5X6 (GAUZE/BANDAGES/DRESSINGS) ×6 IMPLANT
DRSG MEPILEX BORDER 4X12 (GAUZE/BANDAGES/DRESSINGS) ×2 IMPLANT
DURAPREP 26ML APPLICATOR (WOUND CARE) ×3 IMPLANT
ELECT REM PT RETURN 9FT ADLT (ELECTROSURGICAL) ×3 IMPLANT
ELECTRODE REM PT RTRN 9FT ADLT (ELECTROSURGICAL) ×1 IMPLANT
FACESHIELD WRAPAROUND (MASK) ×6 IMPLANT
FACESHIELD WRAPAROUND OR TEAM (MASK) ×2 IMPLANT
GLOVE SURG ENC MOIS LTX SZ6.5 (GLOVE) ×4 IMPLANT
GLOVE SURG ENC MOIS LTX SZ7.5 (GLOVE) ×3 IMPLANT
GLOVE SURG GAMMEX LF SZ6.5 (GLOVE) ×2 IMPLANT
GLOVE SURG GAMMEX LF SZ7 (GLOVE) ×2 IMPLANT
GLOVE SURG UNDER LTX SZ7.5 (GLOVE) ×3 IMPLANT
GOWN STRL REUS W/ TWL LRG LVL3 (GOWN DISPOSABLE) ×2 IMPLANT
GOWN STRL REUS W/TWL 2XL LVL3 (GOWN DISPOSABLE) ×3 IMPLANT
GOWN STRL REUS W/TWL LRG LVL3 (GOWN DISPOSABLE) ×4
GUIDEPIN VERSANAIL DSP 3.2X444 (ORTHOPEDIC DISPOSABLE SUPPLIES) ×2 IMPLANT
HIP FRAC NAIL LAG SCR 10.5X100 (Orthopedic Implant) ×2 IMPLANT
KIT TURNOVER KIT B (KITS) ×3 IMPLANT
MANIFOLD NEPTUNE II (INSTRUMENTS) ×1 IMPLANT
NAIL HIP FRACT 130D 11X180 (Screw) ×2 IMPLANT
NS IRRIG 1000ML POUR BTL (IV SOLUTION) ×3 IMPLANT
PACK GENERAL/GYN (CUSTOM PROCEDURE TRAY) ×3 IMPLANT
PAD ARMBOARD 7.5X6 YLW CONV (MISCELLANEOUS) ×12 IMPLANT
SCREW BONE CORTICAL 5.0X38 (Screw) ×2 IMPLANT
SCREW CANN THRD AFF 10.5X100 (Orthopedic Implant) IMPLANT
SUT MNCRL AB 4-0 PS2 18 (SUTURE) ×3 IMPLANT
SUT VIC AB 1 CT1 27 (SUTURE) ×2
SUT VIC AB 1 CT1 27XBRD ANBCTR (SUTURE) ×1 IMPLANT
SUT VIC AB 2-0 CT1 27 (SUTURE) ×2
SUT VIC AB 2-0 CT1 TAPERPNT 27 (SUTURE) ×1 IMPLANT
TOWEL GREEN STERILE (TOWEL DISPOSABLE) ×3 IMPLANT
TOWEL GREEN STERILE FF (TOWEL DISPOSABLE) ×3 IMPLANT
WATER STERILE IRR 1000ML POUR (IV SOLUTION) ×3 IMPLANT

## 2021-02-15 NOTE — Op Note (Signed)
NAME: Roger Morton, Roger Morton MEDICAL RECORD NO: [id number] ACCOUNT NO: [id number] DATE OF BIRTH: 21-Jul-1971 FACILITY: [facility] LOCATION: [id number] PHYSICIAN: Pietro Cassis. Alvan Dame, MD  Operative Report   DATE OF PROCEDURE: 02/15/2021  PREOPERATIVE DIAGNOSIS:  Left intertrochanteric hip fracture.  POSTOPERATIVE DIAGNOSIS:  Left intertrochanteric hip fracture.  PROCEDURE:  Open reduction and internal fixation of the left intertrochanteric femur fracture utilizing a trochanteric nail from [employer], [employer] nail, 11 x 180 mm with 130-degree lag screw and a distal interlock.  SURGEON:  Pietro Cassis. Alvan Dame, MD  ASSISTANT:  Costella Hatcher.  Note that Ms. Lu Duffel was present for the entirety of the case from preoperative positioning, perioperative management of the operative extremity, general facilitation of the case, and primary wound closure.  ANESTHESIA:  General.  ESTIMATED BLOOD LOSS:  50 mL or less.  COMPLICATIONS:  None.  INDICATIONS FOR PROCEDURE:  The patient is a 50 year old male with medically related comorbidities associated with a history of a brain tumor that required excision.  He has been left with some deficits with his ambulation due to contractures and  weakness.  He had unfortunately a ground level fall at home landing on his left hip.  He had immediate onset of pain and was brought to the emergency room where radiographs revealed a comminuted intertrochanteric femur fracture.  He was admitted to the  medical services and orthopedics was consulted for management.  We reviewed the indications of the procedure.  Risks of malunion, nonunion, infection, DVT discussed and reviewed.  Consent was obtained for benefit of pain relief and fracture management.  DESCRIPTION OF PROCEDURE:  The patient was brought to the operative theater.  Once adequate anesthesia, preoperative antibiotics, Ancef administered, he was positioned supine on the Hana table.  His right unaffected extremity was flexed and abducted  out  of the way with bony prominences padded, particularly over the peroneal nerve laterally.  His left foot was placed in the traction boot.  Fluoroscopy was brought into the field, and with traction and internal rotation, I was able to get the fracture into  a nearly anatomic reduction.  This was confirmed in the AP and lateral planes.  Once this was done, we performed a timeout identifying the patient, planned procedure, and extremity.  The left hip was then prepped and draped in sterile fashion with a  shower curtain technique.  Fluoroscopy was brought back to the field.  The tip of the trochanter was identified.  Incision was then made laterally.  Incision was taken down through the gluteal fascia.  The tip of the trochanter was then identified with a  guidewire and guidewire passed across the fracture site, confirmed radiographically.  I then opened up the proximal femur using the starting drill.  We then passed the nail by hand.  Once I had it at its appropriate depth, we used the insertion jig to  place a guidewire into the center of the head in the AP and lateral planes.  I measured and selected a 100 mm lag screw.  We drilled for this.  We then placed lag screw to the subchondral bone with good purchase.  I then took traction off the lower  extremity and used the compression wheel and medialized the shaft to the fracture site.  Once this was done, I locked proximally and backed it off quarter of a turn to allow for further compression for healing.  I then placed a distal interlock through  the insertion jig.  Once this was done, the insertion jig  was removed from the nail.  Final radiographs were obtained in AP and lateral planes.  The wounds were irrigated with normal saline solution.  The wounds were closed in layers proximally with #1  Vicryl in the gluteal fascia.  The remainder of the wounds were closed with 2-0 Vicryl and running Monocryl stitches.  The wounds were cleaned, dried, and dressed  sterilely using surgical glue and Aquacel dressing.  The patient was then brought to the  recovery room in stable condition tolerating the procedure well.  POSTOPERATIVE PLAN:  The patient will be partial weightbearing on the left lower extremity until followup and radiographic healing.  We will put him on DVT prophylaxis, and he will be discharged based on therapy session.   [facility] D: 02/15/2021 11:04:27 am T: 02/15/2021 1:24:00 pm  JOB: [id number]/ [id number]

## 2021-02-15 NOTE — Progress Notes (Signed)
OT Cancellation Note  Patient Details Name: Roger Morton MRN: [id number] DOB: Sep 23, 1970   Cancelled Treatment:    Reason Eval/Treat Not Completed: Patient at procedure or test/ unavailable (Pt in OR for [place] (Left) with Paralee Cancel, MD. Defer OT eval to later time/date as appropaite.)  Aldona Bar A Hazen Brumett 02/15/2021, 10:08 AM

## 2021-02-15 NOTE — H&P (View-Only) (Signed)
Patient ID: Roger Morton, male   DOB: July 28, 1971, 50 y.o.   MRN: [id number]  Left intertrochanteric hip fracture  NPO To OR today for ORIF Consent obtained  Post op orders and direction to follow

## 2021-02-15 NOTE — Progress Notes (Signed)
Notified  X  Blount that pt requesting hydroxyzine 25mg  three times a day as he takes it at home. RN will continue to monitor.

## 2021-02-15 NOTE — Transfer of Care (Signed)
Immediate Anesthesia Transfer of Care Note  Patient: Roger Morton  Procedure(s) Performed: INTRAMEDULLARY (IM) NAIL INTERTROCHANTRIC (Left: Hip)  Patient Location: PACU  Anesthesia Type:General  Level of Consciousness: drowsy and patient cooperative  Airway & Oxygen Therapy: Patient Spontanous Breathing  Post-op Assessment: Report given to RN and Post -op Vital signs reviewed and stable  Post vital signs: Reviewed and stable  Last Vitals:  Vitals Value Taken Time  BP 145/94 02/15/21 1105  Temp    Pulse 54 02/15/21 1106  Resp 19 02/15/21 1107  SpO2 49 % 02/15/21 1106  Vitals shown include unvalidated device data.  Last Pain:  Vitals:   02/15/21 0851  TempSrc: Oral  PainSc: 7          Complications: No notable events documented.

## 2021-02-15 NOTE — H&P (Addendum)
History and Physical  Roger Morton Roger Morton:[id number] DOB: 29-Oct-1970 DOA: 02/14/2021  Referring physician: Quincy Carnes, PA-EDP. PCP: Noreene Larsson, NP  Outpatient Specialists: Neurology Patient coming from: Home.  Chief Complaint: Fall  HPI: Roger Morton is a 50 y.o. male with medical history significant for previous CVA, ambulatory dysfunction with use of a walker due to bilateral lower extremity contractures, essential hypertension, seizure disorder, BPH, polyneuropathy, tobacco use disorder, GERD who presented to [facility] ED after a mechanical fall at home.  He was in his usual state of health prior to this.  He tripped with his walker on an elevated walking surface causing him to fall on his left side.  Was brought to the ED for further evaluation.  In the ED imaging revealed left proximal femoral fracture.  Orthopedic surgery Dr. Lyla Glassing was consulted.  Plan for orthopedic surgical intervention.  TRH, hospitalist team, was asked to admit.  ED Course:  Temperature 98.9.  BP 101/67, pulse 101, O2 saturation 89% on room air.  Lab studies significant for serum sodium 141, potassium 3.3, BUN 14, creatinine 0.75, WBC 13.7.  Review of Systems: Review of systems as noted in the HPI. All other systems reviewed and are negative.   Past Medical History:  Diagnosis Date   Stroke [facility])    had brain tumor, and had CVAs during operation for his brain tumor   Past Surgical History:  Procedure Laterality Date   BRAIN SURGERY  2013   brain tumor excision   shunt intracranial x2      Social History:  reports that he has been smoking cigarettes. He has a 37.50 pack-year smoking history. He has never used smokeless tobacco. He reports previous alcohol use. He reports previous drug use.   Allergies  Allergen Reactions   Penicillins    Family history:  Brother with history of back injury from [place] war.  Prior to Admission medications   Medication Sig Start Date End Date Taking? Authorizing  Provider  acetaminophen (TYLENOL) 325 MG tablet Take 650 mg by mouth daily.   Yes [provider]  atorvastatin (LIPITOR) 40 MG tablet Take 40 mg by mouth at bedtime.   Yes [provider]  baclofen (LIORESAL) 10 MG tablet Take 1 tablet (10 mg total) by mouth 3 (three) times daily. 02/04/21  Yes Noreene Larsson, NP  busPIRone (BUSPAR) 5 MG tablet Take 1 tablet (5 mg total) by mouth 2 (two) times daily. 01/15/21  Yes Noreene Larsson, NP  Carboxymethylcellul-Glycerin (CLEAR EYES FOR DRY EYES OP) Place 1 drop into the left eye in the morning and at bedtime.   Yes [provider]  celecoxib (CELEBREX) 200 MG capsule Take 1 capsule (200 mg total) by mouth daily. 01/15/21  Yes Noreene Larsson, NP  cetirizine (ZYRTEC) 10 MG tablet Take 1 tablet (10 mg total) by mouth daily. 01/15/21  Yes Noreene Larsson, NP  docusate sodium (COLACE) 100 MG capsule Take 1 capsule (100 mg total) by mouth daily. Patient taking differently: Take 100 mg by mouth 2 (two) times daily. 01/15/21  Yes Noreene Larsson, NP  gabapentin (NEURONTIN) 100 MG capsule Take 1 capsule (100 mg total) by mouth at bedtime. 01/15/21  Yes Noreene Larsson, NP  hydrochlorothiazide (HYDRODIURIL) 25 MG tablet Take 1 tablet (25 mg total) by mouth daily. 01/15/21  Yes Noreene Larsson, NP  hydrOXYzine (ATARAX/VISTARIL) 25 MG tablet Take 1 tablet (25 mg total) by mouth 3 (three) times daily. 01/15/21  Yes Pearline Cables,  Trinna Balloon, NP  levETIRAcetam (KEPPRA) 750 MG tablet Take 1 tablet (750 mg total) by mouth 2 (two) times daily. 01/15/21  Yes Noreene Larsson, NP  omeprazole (PRILOSEC) 20 MG capsule Take 1 capsule (20 mg total) by mouth daily. 02/04/21  Yes Noreene Larsson, NP  promethazine (PHENERGAN) 12.5 MG tablet Take 1 tablet (12.5 mg total) by mouth every 8 (eight) hours as needed for nausea or vomiting. 01/15/21  Yes Noreene Larsson, NP  tamsulosin (FLOMAX) 0.4 MG CAPS capsule Take 0.4 mg by mouth in the morning and at bedtime. 08/20/20  Yes [provider]  traZODone (DESYREL) 150 MG tablet Take 1 tablet (150 mg total) by mouth at bedtime. 01/15/21  Yes Noreene Larsson, NP  triamcinolone cream (KENALOG) 0.1 % Apply 1 application topically 2 (two) times daily. 02/09/21  Yes Noreene Larsson, NP  ketoconazole (NIZORAL) 2 % cream Apply 1 application topically daily. 02/09/21   Noreene Larsson, NP    Physical Exam: BP (!) 100/59   Pulse 100   Temp 98.9 F (37.2 C) (Oral)   Resp 18   Ht 6\' 2"  (1.88 m)   Wt 103.9 kg   SpO2 90%   BMI 29.40 kg/m   General: 50 y.o. year-old male well developed well nourished in no acute distress.  Alert and oriented x3. Cardiovascular: Regular rate and rhythm with no rubs or gallops.  No thyromegaly or JVD noted.  No lower extremity edema. 2/4 pulses in all 4 extremities. Respiratory: Clear to auscultation with no wheezes or rales. Good inspiratory effort. Abdomen: Soft nontender nondistended with normal bowel sounds x4 quadrants. Muskuloskeletal: No cyanosis, clubbing or edema noted bilaterally Neuro: CN II-XII intact, strength, sensation, reflexes Skin: No ulcerative lesions noted or rashes Psychiatry: Judgement and insight appear normal. Mood is appropriate for condition and setting          Labs on Admission:  Basic Metabolic Panel: Recent Labs  Lab 02/14/21 2314  NA 141  K 3.3*  CL 104  CO2 28  GLUCOSE 106*  BUN 14  CREATININE 0.75  CALCIUM 9.0   Liver Function Tests: Recent Labs  Lab 02/14/21 2314  AST 18  ALT 14  ALKPHOS 70  BILITOT 0.5  PROT 6.4*  ALBUMIN 3.6   No results for input(s): LIPASE, AMYLASE in the last 168 hours. No results for input(s): AMMONIA in the last 168 hours. CBC: Recent Labs  Lab 02/14/21 2314  WBC 13.7*  NEUTROABS 11.2*  HGB 14.0  HCT 41.5  MCV 85.7  PLT 253   Cardiac Enzymes: No results for input(s): CKTOTAL, CKMB, CKMBINDEX, TROPONINI in the last 168 hours.  BNP (last 3 results) No results for input(s): BNP in the last 8760  hours.  ProBNP (last 3 results) No results for input(s): PROBNP in the last 8760 hours.  CBG: No results for input(s): GLUCAP in the last 168 hours.  Radiological Exams on Admission: DG Hip Unilat W or Wo Pelvis 2-3 Views Left  Result Date: 02/15/2021 CLINICAL DATA:  Status post fall. EXAM: DG HIP (WITH OR WITHOUT PELVIS) 2-3V LEFT COMPARISON:  None. FINDINGS: An acute fracture deformity is seen extending through the inter trochanteric region of the proximal left femur. There is no evidence of dislocation. Moderate severity degenerative changes are seen involving both hips in the form of joint space narrowing and acetabular sclerosis. IMPRESSION: Acute fracture of the proximal left femur. Electronically Signed   By: Virgina Norfolk M.D.   On:  02/15/2021 00:32    EKG: I independently viewed the EKG done and my findings are as followed: None available at the time of this visit.  Assessment/Plan Present on Admission:  Femur fracture ([place])  Active Problems:   Femur fracture ([facility])  Acute left proximal femur fracture, POA Orthopedic surgery consulted by EDP, plan for surgical intervention overnight. Monitor H&H. Pain control N.p.o. except for sips with meds Bowel regimen PT OT consult to assess post surgery with orthopedic surgery's guidance TOC consulted to assist with DC planning  Leukocytosis, likely reactive in the setting of acute fracture Will still need to rule out active infective process Obtain UA and urine culture, follow results Afebrile. Monitor fever curve and WBC  Seizure disorder Resume home AED Seizure precautions IV Ativan PRN for breakthrough seizures  Hypokalemia Serum potassium 3.3 Repleted orally with KCl 40 meq Obtain magnesium level Repeat level in the AM  Neuropathy, contractures Resume home regimen  Hyperlipidemia Resume home regimen  BPH Resume home regimen  GERD Resume home PPI daily.  Tobacco use disorder Start nicotine  patch.   DVT prophylaxis: SCDs.  Defer pharmacological DVT ppx to ortho, post surgery.  Code Status: Full code  Family Communication: None at bedside  Disposition Plan: Admit to telemetry surgical  Consults called: Orthopedic surgery  Admission status: Inpatient status.  Patient will require at least 2 midnights for further evaluation and treatment of present condition.   Status is: Inpatient    Dispo:  Patient From: Home  Planned Disposition: [place] possibly on 02/17/21 or when ortho signs off.  Medically stable for discharge: No         Kayleen Memos MD [facility] Pager [phone]  If 7PM-7AM, please contact night-coverage [email or web address] Password [email or web address]  02/15/2021, 3:06 AM

## 2021-02-15 NOTE — Anesthesia Procedure Notes (Signed)
Procedure Name: Intubation Date/Time: 02/15/2021 9:40 AM Performed by: Lance Coon, CRNA Pre-anesthesia Checklist: Patient identified, Emergency Drugs available, Suction available, Patient being monitored and Timeout performed Patient Re-evaluated:Patient Re-evaluated prior to induction Oxygen Delivery Method: Circle system utilized Preoxygenation: Pre-oxygenation with 100% oxygen Induction Type: IV induction Ventilation: Mask ventilation without difficulty Laryngoscope Size: Miller and 3 Grade View: Grade I Tube type: Oral Tube size: 7.5 mm Number of attempts: 1 Airway Equipment and Method: Stylet Placement Confirmation: positive ETCO2, ETT inserted through vocal cords under direct vision and breath sounds checked- equal and bilateral Secured at: 22 cm Tube secured with: Tape Dental Injury: Teeth and Oropharynx as per pre-operative assessment

## 2021-02-15 NOTE — Anesthesia Preprocedure Evaluation (Addendum)
Anesthesia Evaluation  Patient identified by MRN, date of birth, ID band Patient awake    Reviewed: Allergy & Precautions, NPO status , Patient's Chart, lab work & pertinent test results  Airway Mallampati: II  TM Distance: >3 FB Neck ROM: Full    Dental  (+) Dental Advisory Given, Missing, Poor Dentition   Pulmonary Current Smoker,    Pulmonary exam normal breath sounds clear to auscultation       Cardiovascular hypertension, Pt. on medications Normal cardiovascular exam Rhythm:Regular Rate:Normal     Neuro/Psych Seizures -,  brain surgery for tumor excision, stroke, gait disturbance requiring use of walker CVA, Residual Symptoms    GI/Hepatic Neg liver ROS, GERD  Medicated,  Endo/Other  negative endocrine ROS  Renal/GU negative Renal ROS     Musculoskeletal Left femur fx   Abdominal   Peds  Hematology negative hematology ROS (+) Plt 263k   Anesthesia Other Findings Day of surgery medications reviewed with the patient.  Reproductive/Obstetrics                            Anesthesia Physical Anesthesia Plan  ASA: 3  Anesthesia Plan: General   Post-op Pain Management:    Induction: Intravenous  PONV Risk Score and Plan: 2 and Dexamethasone, Ondansetron and Midazolam  Airway Management Planned: Oral ETT  Additional Equipment:   Intra-op Plan:   Post-operative Plan: Extubation in OR  Informed Consent: I have reviewed the patients History and Physical, chart, labs and discussed the procedure including the risks, benefits and alternatives for the proposed anesthesia with the patient or authorized representative who has indicated his/her understanding and acceptance.     Dental advisory given  Plan Discussed with: CRNA  Anesthesia Plan Comments:        Anesthesia Quick Evaluation

## 2021-02-15 NOTE — ED Provider Notes (Signed)
Assumed care from [place].  See prior notes for full H&P.  Briefly, 50 y.o. M here after fall, now with left thigh pain.  Plan:   x-ray pending.   Results for orders placed or performed during the hospital encounter of 02/14/21  Resp Panel by RT-PCR (Flu A&B, Covid) Nasopharyngeal Swab   Specimen: Nasopharyngeal Swab; Nasopharyngeal(NP) swabs in vial transport medium  Result Value Ref Range   SARS Coronavirus 2 by RT PCR NEGATIVE NEGATIVE   Influenza A by PCR NEGATIVE NEGATIVE   Influenza B by PCR NEGATIVE NEGATIVE  Comprehensive metabolic panel  Result Value Ref Range   Sodium 141 135 - 145 mmol/L   Potassium 3.3 (L) 3.5 - 5.1 mmol/L   Chloride 104 98 - 111 mmol/L   CO2 28 22 - 32 mmol/L   Glucose, Bld 106 (H) 70 - 99 mg/dL   BUN 14 6 - 20 mg/dL   Creatinine, Ser 0.75 0.61 - 1.24 mg/dL   Calcium 9.0 8.9 - 10.3 mg/dL   Total Protein 6.4 (L) 6.5 - 8.1 g/dL   Albumin 3.6 3.5 - 5.0 g/dL   AST 18 15 - 41 U/L   ALT 14 0 - 44 U/L   Alkaline Phosphatase 70 38 - 126 U/L   Total Bilirubin 0.5 0.3 - 1.2 mg/dL   GFR, Estimated >60 >60 mL/min   Anion gap 9 5 - 15  CBC with Differential  Result Value Ref Range   WBC 13.7 (H) 4.0 - 10.5 K/uL   RBC 4.84 4.22 - 5.81 MIL/uL   Hemoglobin 14.0 13.0 - 17.0 g/dL   HCT 41.5 39.0 - 52.0 %   MCV 85.7 80.0 - 100.0 fL   MCH 28.9 26.0 - 34.0 pg   MCHC 33.7 30.0 - 36.0 g/dL   RDW 14.3 11.5 - 15.5 %   Platelets 253 150 - 400 K/uL   nRBC 0.0 0.0 - 0.2 %   Neutrophils Relative % 81 %   Neutro Abs 11.2 (H) 1.7 - 7.7 K/uL   Lymphocytes Relative 11 %   Lymphs Abs 1.5 0.7 - 4.0 K/uL   Monocytes Relative 7 %   Monocytes Absolute 0.9 0.1 - 1.0 K/uL   Eosinophils Relative 0 %   Eosinophils Absolute 0.0 0.0 - 0.5 K/uL   Basophils Relative 0 %   Basophils Absolute 0.0 0.0 - 0.1 K/uL   Immature Granulocytes 1 %   Abs Immature Granulocytes 0.08 (H) 0.00 - 0.07 K/uL   DG Hip Unilat W or Wo Pelvis 2-3 Views Left  Result Date: 02/15/2021 CLINICAL DATA:   Status post fall. EXAM: DG HIP (WITH OR WITHOUT PELVIS) 2-3V LEFT COMPARISON:  None. FINDINGS: An acute fracture deformity is seen extending through the inter trochanteric region of the proximal left femur. There is no evidence of dislocation. Moderate severity degenerative changes are seen involving both hips in the form of joint space narrowing and acetabular sclerosis. IMPRESSION: Acute fracture of the proximal left femur. Electronically Signed   By: Virgina Norfolk M.D.   On: 02/15/2021 00:32   DG Knee AP/LAT W/[place] Left  Result Date: 02/10/2021 Formatting of this result is different from the original. X-rays left knee were obtained in clinic today and demonstrates no acute injuries.  Well-maintained joint spaces.  Flexion contractures evident on the x-rays.  Calcified matrix in the distal femur.   Impression: Normal left knee x-ray.  Minimal degenerative changes.   X-ray today with proximal left femur fracture.  Patient does have  some contractures of the leg from prior stroke but still ambulatory with walker.  Patient has been seen by Dr. Jamse Mead in [place] for single visit earlier this month for knee injection.  Unfortunately, he does not practice at this facility.  Will discuss with orthopedics on call.  Last PO intake 7pm, not on anticoagulation.  Discussed with OR nurse in with Dr. Lyla Glassing-- likely repair later today.  Hospitalist admit.  Will keep NPO.  Discussed with hospitalist, Dr. Nevada Crane-- will admit for ongoing care.   Larene Pickett, PA-C 02/15/21 0203    Merrily Pew, MD 02/15/21 [phone]

## 2021-02-15 NOTE — Brief Op Note (Signed)
02/14/2021 - 02/15/2021  10:41 AM  PATIENT:  Roger Morton  50 y.o. male  PRE-OPERATIVE DIAGNOSIS:  Left intertrochanteric hip fracture  POST-OPERATIVE DIAGNOSIS:  Left intertrochanteric hip fracture  PROCEDURE:  Procedure(s): INTRAMEDULLARY (IM) NAIL INTERTROCHANTRIC (Left)  SURGEON:  Surgeon(s) and Role:    Paralee Cancel, MD - Primary  PHYSICIAN ASSISTANT: Costella Hatcher, PA-C  ANESTHESIA:   general  EBL:  50 mL   BLOOD ADMINISTERED:none  DRAINS: none   LOCAL MEDICATIONS USED:  NONE  SPECIMEN:  No Specimen  DISPOSITION OF SPECIMEN:  N/A  COUNTS:  YES  TOURNIQUET:  * No tourniquets in log *  DICTATION: .Other Dictation: Dictation Number [id number]  PLAN OF CARE: Admit to inpatient   PATIENT DISPOSITION:  PACU - hemodynamically stable.   Delay start of Pharmacological VTE agent (>24hrs) due to surgical blood loss or risk of bleeding: no

## 2021-02-15 NOTE — Discharge Instructions (Addendum)
INSTRUCTIONS AFTER SURGERY  Remove items at home which could result in a fall. This includes throw rugs or furniture in walking pathways ICE to the affected joint every three hours while awake for 30 minutes at a time, for at least the first 3-5 days, and then as needed for pain and swelling.  Continue to use ice for pain and swelling. You may notice swelling that will progress down to the foot and ankle.  This is normal after surgery.  Elevate your leg when you are not up walking on it.   Continue to use the breathing machine you got in the hospital (incentive spirometer) which will help keep your temperature down.  It is common for your temperature to cycle up and down following surgery, especially at night when you are not up moving around and exerting yourself.  The breathing machine keeps your lungs expanded and your temperature down.   DIET:  As you were doing prior to hospitalization, we recommend a well-balanced diet.  DRESSING / WOUND CARE / SHOWERING  Keep the surgical dressing until follow up.  The dressing is water proof, so you can shower without any extra covering.  IF THE DRESSING FALLS OFF or the wound gets wet inside, change the dressing with sterile gauze.  Please use good hand washing techniques before changing the dressing.  Do not use any lotions or creams on the incision until instructed by your surgeon.    ACTIVITY  Increase activity slowly as tolerated, but follow the weight bearing instructions below.   No driving for 6 weeks or until further direction given by your physician.  You cannot drive while taking narcotics.  No lifting or carrying greater than 10 lbs. until further directed by your surgeon. Avoid periods of inactivity such as sitting longer than an hour when not asleep. This helps prevent blood clots.  You may return to work once you are authorized by your doctor.     WEIGHT BEARING   Partial weight bearing with assist device as directed.   50%  CONSTIPATION  Constipation is defined medically as fewer than three stools per week and severe constipation as less than one stool per week.  Even if you have a regular bowel pattern at home, your normal regimen is likely to be disrupted due to multiple reasons following surgery.  Combination of anesthesia, postoperative narcotics, change in appetite and fluid intake all can affect your bowels.   YOU MUST use at least one of the following options; they are listed in order of increasing strength to get the job done.  They are all available over the counter, and you may need to use some, POSSIBLY even all of these options:    Drink plenty of fluids (prune juice may be helpful) and high fiber foods Colace 100 mg by mouth twice a day  Senokot for constipation as directed and as needed Dulcolax (bisacodyl), take with full glass of water  Miralax (polyethylene glycol) once or twice a day as needed.  If you have tried all these things and are unable to have a bowel movement in the first 3-4 days after surgery call either your surgeon or your primary doctor.    If you experience loose stools or diarrhea, hold the medications until you stool forms back up.  If your symptoms do not get better within 1 week or if they get worse, check with your doctor.  If you experience "the worst abdominal pain ever" or develop nausea or vomiting, please contact  the office immediately for further recommendations for treatment.   ITCHING:  If you experience itching with your medications, try taking only a single pain pill, or even half a pain pill at a time.  You can also use Benadryl over the counter for itching or also to help with sleep.   TED HOSE STOCKINGS:  Use stockings on both legs until for at least 2 weeks or as directed by physician office. They may be removed at night for sleeping.  MEDICATIONS:  See your medication summary on the "After Visit Summary" that nursing will review with you.  You may have  some home medications which will be placed on hold until you complete the course of blood thinner medication.  It is important for you to complete the blood thinner medication as prescribed.  PRECAUTIONS:  If you experience chest pain or shortness of breath - call 911 immediately for transfer to the hospital emergency department.   If you develop a fever greater that 101 F, purulent drainage from wound, increased redness or drainage from wound, foul odor from the wound/dressing, or calf pain - CONTACT YOUR SURGEON.                                                   FOLLOW-UP APPOINTMENTS:  If you do not already have a post-op appointment, please call the office for an appointment to be seen by your surgeon.  Guidelines for how soon to be seen are listed in your "After Visit Summary", but are typically between 1-4 weeks after surgery.  POST-OPERATIVE OPIOID TAPER INSTRUCTIONS: It is important to wean off of your opioid medication as soon as possible. If you do not need pain medication after your surgery it is ok to stop day one. Opioids include: Codeine, Hydrocodone(Norco, Vicodin), Oxycodone(Percocet, oxycontin) and hydromorphone amongst others.  Long term and even short term use of opiods can cause: Increased pain response Dependence Constipation Depression Respiratory depression And more.  Withdrawal symptoms can include Flu like symptoms Nausea, vomiting And more Techniques to manage these symptoms Hydrate well Eat regular healthy meals Stay active Use relaxation techniques(deep breathing, meditating, yoga) Do Not substitute Alcohol to help with tapering If you have been on opioids for less than two weeks and do not have pain than it is ok to stop all together.  Plan to wean off of opioids This plan should start within one week post op of your joint replacement. Maintain the same interval or time between taking each dose and first decrease the dose.  Cut the total daily intake of  opioids by one tablet each day Next start to increase the time between doses. The last dose that should be eliminated is the evening dose.   MAKE SURE YOU:  Understand these instructions.  Get help right away if you are not doing well or get worse.    Thank you for letting us be a part of your medical care team.  It is a privilege we respect greatly.  We hope these instructions will help you stay on track for a fast and full recovery!

## 2021-02-15 NOTE — Progress Notes (Signed)
PROGRESS NOTE    Roger Morton  TML:[id number] DOB: 09/30/1970 DOA: 02/14/2021 PCP: Noreene Larsson, NP   Brief Narrative: Roger Morton is a 50 y.o. male with a history CVA, seizure disorder, neuropathy, hypertension, BPH, GERD. Patient presented after a fall and resultant left hip fracture. Orthopedic surgery consulted.   Assessment & Plan:   Active Problems:   Seizure disorder (HCC)   Femur fracture (HCC)   Leukocytosis   Hyperlipidemia   GERD (gastroesophageal reflux disease)   Left proximal femur fracture Secondary to fall. Orthopedic surgery consulted. IM nail placement performed on 6/26. Partial weight bearing recommended. PT/OT consulted and recommendations are pending.  Leukocytosis Likely reactive. WBC of 13.7k. Rending down.  Seizure disorder -Continue home Keppra  Hypokalemia Mild. Resolved.  Neuropathy Contractures -Continue gabapentin  Hyperlipidemia -Continue Lipitor  BPH -Continue tamsulosin  GERD -Continue Protonix  Tobacco use -Continue nicotine patch   DVT prophylaxis: SCDs, otherwise per surgery Code Status:   Code Status: Full Code Family Communication: None at bedside Disposition Plan: Discharge likely to SNF pending PT/OT recommendations and stable hemoglobin   Consultants:  Orthopedic surgery  Procedures:  LEFT INTRAMEDULLARY NAIL (02/15/2021)  Antimicrobials: None    Subjective: Patient with some leg pain. Really wants coffee  Objective: Vitals:   02/15/21 1105 02/15/21 1120 02/15/21 1135 02/15/21 1207  BP: (!) 145/94 (!) 129/100 (!) 130/92 (!) 126/105  Pulse: 98 (!) 102 95 (!) 101  Resp: 18 15 (!) 22 20  Temp: 97.8 F (36.6 C)  (!) 97.5 F (36.4 C) 98.4 F (36.9 C)  TempSrc:    Oral  SpO2: 100% 97% 100% 93%  Weight:      Height:        Intake/Output Summary (Last 24 hours) at 02/15/2021 1412 Last data filed at 02/15/2021 1102 Gross per 24 hour  Intake 600 ml  Output 50 ml  Net 550 ml   Filed  Weights   02/14/21 2112  Weight: 103.9 kg    Examination:  General exam: Appears calm and comfortable Respiratory system: Clear to auscultation. Respiratory effort normal. Cardiovascular system: S1 & S2 heard, RRR. No murmurs, rubs, gallops or clicks. Gastrointestinal system: Abdomen is nondistended, soft and nontender. No organomegaly or masses felt. Normal bowel sounds heard. Central nervous system: Alert and oriented. No focal neurological deficits. Musculoskeletal: No edema. No calf tenderness. Left thigh wound dressing intact with no ecchymosis noted Skin: No cyanosis. No rashes Psychiatry: Judgement and insight appear normal. Mood & affect appropriate.     Data Reviewed: I have personally reviewed following labs and imaging studies  CBC Lab Results  Component Value Date   WBC 11.3 (H) 02/15/2021   RBC 4.86 02/15/2021   HGB 13.8 02/15/2021   HCT 41.7 02/15/2021   MCV 85.8 02/15/2021   MCH 28.4 02/15/2021   PLT 263 02/15/2021   MCHC 33.1 02/15/2021   RDW 14.2 02/15/2021   LYMPHSABS 1.5 02/14/2021   MONOABS 0.9 02/14/2021   EOSABS 0.0 02/14/2021   BASOSABS 0.0 68/07/7516     Last metabolic panel Lab Results  Component Value Date   NA 138 02/15/2021   K 3.5 02/15/2021   CL 104 02/15/2021   CO2 24 02/15/2021   BUN 15 02/15/2021   CREATININE 0.64 02/15/2021   GLUCOSE 140 (H) 02/15/2021   GFRNONAA >60 02/15/2021   CALCIUM 9.0 02/15/2021   PHOS 2.9 02/15/2021   PROT 6.4 (L) 02/14/2021   ALBUMIN 3.6 02/14/2021   LABGLOB 2.7 01/29/2021  AGRATIO 1.7 01/29/2021   BILITOT 0.5 02/14/2021   ALKPHOS 70 02/14/2021   AST 18 02/14/2021   ALT 14 02/14/2021   ANIONGAP 10 02/15/2021    CBG (last 3)  No results for input(s): GLUCAP in the last 72 hours.   GFR: Estimated Creatinine Clearance: 140.5 mL/min (by C-G formula based on SCr of 0.64 mg/dL).  Coagulation Profile: No results for input(s): INR, PROTIME in the last 168 hours.  Recent Results (from the past  240 hour(s))  Resp Panel by RT-PCR (Flu A&B, Covid) Nasopharyngeal Swab     Status: None   Collection Time: 02/14/21 11:15 PM   Specimen: Nasopharyngeal Swab; Nasopharyngeal(NP) swabs in vial transport medium  Result Value Ref Range Status   SARS Coronavirus 2 by RT PCR NEGATIVE NEGATIVE Final    Comment: (NOTE) SARS-CoV-2 target nucleic acids are NOT DETECTED.  The SARS-CoV-2 RNA is generally detectable in upper respiratory specimens during the acute phase of infection. The lowest concentration of SARS-CoV-2 viral copies this assay can detect is 138 copies/mL. A negative result does not preclude SARS-Cov-2 infection and should not be used as the sole basis for treatment or other patient management decisions. A negative result may occur with  improper specimen collection/handling, submission of specimen other than nasopharyngeal swab, presence of viral mutation(s) within the areas targeted by this assay, and inadequate number of viral copies(<138 copies/mL). A negative result must be combined with clinical observations, patient history, and epidemiological information. The expected result is Negative.  Fact Sheet for Patients:  EntrepreneurPulse.com.au  Fact Sheet for Healthcare Providers:  IncredibleEmployment.be  This test is no t yet approved or cleared by the [place] FDA and  has been authorized for detection and/or diagnosis of SARS-CoV-2 by FDA under an Emergency Use Authorization (EUA). This EUA will remain  in effect (meaning this test can be used) for the duration of the COVID-19 declaration under Section 564(b)(1) of the Act, 21 U.S.C.section 360bbb-3(b)(1), unless the authorization is terminated  or revoked sooner.       Influenza A by PCR NEGATIVE NEGATIVE Final   Influenza B by PCR NEGATIVE NEGATIVE Final    Comment: (NOTE) The Xpert Xpress SARS-CoV-2/FLU/RSV plus assay is intended as an aid in the diagnosis of influenza  from Nasopharyngeal swab specimens and should not be used as a sole basis for treatment. Nasal washings and aspirates are unacceptable for Xpert Xpress SARS-CoV-2/FLU/RSV testing.  Fact Sheet for Patients: EntrepreneurPulse.com.au  Fact Sheet for Healthcare Providers: IncredibleEmployment.be  This test is not yet approved or cleared by the [place] FDA and has been authorized for detection and/or diagnosis of SARS-CoV-2 by FDA under an Emergency Use Authorization (EUA). This EUA will remain in effect (meaning this test can be used) for the duration of the COVID-19 declaration under Section 564(b)(1) of the Act, 21 U.S.C. section 360bbb-3(b)(1), unless the authorization is terminated or revoked.  Performed at [facility], [place] [address], [place], [place] [address]   Surgical PCR screen     Status: Abnormal   Collection Time: 02/15/21  4:34 AM   Specimen: Nasal Mucosa; Nasal Swab  Result Value Ref Range Status   MRSA, PCR POSITIVE (A) NEGATIVE Final    Comment: RESULT CALLED TO, READ BACK BY AND VERIFIED WITH: A,DAVY RN @1155  02/15/21 EB    Staphylococcus aureus POSITIVE (A) NEGATIVE Final    Comment: (NOTE) The Xpert SA Assay (FDA approved for NASAL specimens in patients 74 years of age and older), is one  component of a comprehensive surveillance program. It is not intended to diagnose infection nor to guide or monitor treatment. Performed at [facility], [place] [address] [place], [place] [address]         Radiology Studies: DG C-Arm 1-60 Min  Result Date: 02/15/2021 CLINICAL DATA:  Left hip fracture repair. EXAM: DG C-ARM 1-60 MIN FLUOROSCOPY TIME:  Fluoroscopy Time:  1 minutes 41 seconds Radiation Exposure Index (if provided by the fluoroscopic device): 31.89 mGy Number of Acquired Spot Images: 2 COMPARISON:  February 14, 2021 FINDINGS: A gamma nail and intramedullary rod have been placed across the intertrochanteric  fracture with improved alignment. A distal interlocking screw is noted. IMPRESSION: Left hip fracture repair as above. Electronically Signed   By: Dorise Bullion III M.D   On: 02/15/2021 13:06   DG HIP OPERATIVE UNILAT WITH PELVIS LEFT  Result Date: 02/15/2021 CLINICAL DATA:  Left hip fracture repair. EXAM: DG C-ARM 1-60 MIN FLUOROSCOPY TIME:  Fluoroscopy Time:  1 minutes 41 seconds Radiation Exposure Index (if provided by the fluoroscopic device): 31.89 mGy Number of Acquired Spot Images: 2 COMPARISON:  February 14, 2021 FINDINGS: A gamma nail and intramedullary rod have been placed across the intertrochanteric fracture with improved alignment. A distal interlocking screw is noted. IMPRESSION: Left hip fracture repair as above. Electronically Signed   By: Dorise Bullion III M.D   On: 02/15/2021 13:06   DG Hip Unilat W or Wo Pelvis 2-3 Views Left  Result Date: 02/15/2021 CLINICAL DATA:  Status post fall. EXAM: DG HIP (WITH OR WITHOUT PELVIS) 2-3V LEFT COMPARISON:  None. FINDINGS: An acute fracture deformity is seen extending through the inter trochanteric region of the proximal left femur. There is no evidence of dislocation. Moderate severity degenerative changes are seen involving both hips in the form of joint space narrowing and acetabular sclerosis. IMPRESSION: Acute fracture of the proximal left femur. Electronically Signed   By: Virgina Norfolk M.D.   On: 02/15/2021 00:32        Scheduled Meds:  [START ON 02/16/2021] aspirin EC  325 mg Oral Daily   atorvastatin  40 mg Oral QHS   baclofen  10 mg Oral TID   busPIRone  5 mg Oral BID   docusate sodium  100 mg Oral BID   fentaNYL       gabapentin  100 mg Oral QHS   levETIRAcetam  750 mg Oral BID   mupirocin ointment  1 application Nasal BID   nicotine  14 mg Transdermal Daily   pantoprazole  40 mg Oral Daily   senna-docusate  2 tablet Oral QHS   tamsulosin  0.4 mg Oral QPC breakfast   Continuous Infusions:   ceFAZolin (ANCEF) IV      methocarbamol (ROBAXIN) IV       LOS: 0 days     Cordelia Poche, MD [facility] 02/15/2021, 2:12 PM  If 7PM-7AM, please contact night-coverage [email or web address]

## 2021-02-15 NOTE — Consult Note (Signed)
ORTHOPAEDIC CONSULTATION  REQUESTING PHYSICIAN: Kayleen Memos, DO  PCP:  Noreene Larsson, NP  Chief Complaint: Lytle Michaels  HPI: Roger Morton is a 50 y.o. male who presented to [facility] ED after a ground level fall. He reports he was walking when his walker hit uneven ground causing him to fall. He was unable to bear weight. Imaging in the ED revealed left intertrochanteric femur fracture. Orthopaedics was consulted for evaluation and management. Patient is not on anticoagulants. He is followed by Dr. Jamse Mead in [place] for Orthopaedics.   Past Medical History:  Diagnosis Date   Stroke [facility])    had brain tumor, and had CVAs during operation for his brain tumor   Past Surgical History:  Procedure Laterality Date   BRAIN SURGERY  2013   brain tumor excision   shunt intracranial x2     Social History   Socioeconomic History   Marital status: Single    Spouse name: Not on file   Number of children: 3   Years of education: Not on file   Highest education level: Not on file  Occupational History   Occupation: Disabled  Tobacco Use   Smoking status: Every Day    Packs/day: 1.50    Years: 25.00    Pack years: 37.50    Types: Cigarettes   Smokeless tobacco: Never  Vaping Use   Vaping Use: Some days   Substances: Nicotine  Substance and Sexual Activity   Alcohol use: Not Currently   Drug use: Not Currently   Sexual activity: Not Currently  Other Topics Concern   Not on file  Social History Narrative   2 in [place], the other daughter may be in [place], [place]   Social Determinants of Health   Financial Resource Strain: Low Risk    Difficulty of Paying Living Expenses: Not hard at all  Food Insecurity: No Food Insecurity   Worried About Charity fundraiser in the Last Year: Never true   [place] in the Last Year: Never true  Transportation Needs: No Transportation Needs   Lack of Transportation (Medical): No   Lack of Transportation (Non-Medical): No   Physical Activity: Sufficiently Active   Days of Exercise per Week: 7 days   Minutes of Exercise per Session: 30 min  Stress: No Stress Concern Present   Feeling of Stress : Not at all  Social Connections: Socially Isolated   Frequency of Communication with Friends and Family: Three times a week   Frequency of Social Gatherings with Friends and Family: Three times a week   Attends Religious Services: Never   Active Member of Clubs or Organizations: No   Attends Archivist Meetings: Never   Marital Status: Never married   No family history on file. Allergies  Allergen Reactions   Penicillins    Prior to Admission medications   Medication Sig Start Date End Date Taking? Authorizing Provider  acetaminophen (TYLENOL) 325 MG tablet Take 650 mg by mouth daily.   Yes [provider]  atorvastatin (LIPITOR) 40 MG tablet Take 40 mg by mouth at bedtime.   Yes [provider]  baclofen (LIORESAL) 10 MG tablet Take 1 tablet (10 mg total) by mouth 3 (three) times daily. 02/04/21  Yes Noreene Larsson, NP  busPIRone (BUSPAR) 5 MG tablet Take 1 tablet (5 mg total) by mouth 2 (two) times daily. 01/15/21  Yes Noreene Larsson, NP  Carboxymethylcellul-Glycerin (CLEAR EYES FOR DRY EYES  OP) Place 1 drop into the left eye in the morning and at bedtime.   Yes [provider]  celecoxib (CELEBREX) 200 MG capsule Take 1 capsule (200 mg total) by mouth daily. 01/15/21  Yes Noreene Larsson, NP  cetirizine (ZYRTEC) 10 MG tablet Take 1 tablet (10 mg total) by mouth daily. 01/15/21  Yes Noreene Larsson, NP  docusate sodium (COLACE) 100 MG capsule Take 1 capsule (100 mg total) by mouth daily. Patient taking differently: Take 100 mg by mouth 2 (two) times daily. 01/15/21  Yes Noreene Larsson, NP  gabapentin (NEURONTIN) 100 MG capsule Take 1 capsule (100 mg total) by mouth at bedtime. 01/15/21  Yes Noreene Larsson, NP  hydrochlorothiazide (HYDRODIURIL) 25 MG tablet Take 1 tablet (25 mg total)  by mouth daily. 01/15/21  Yes Noreene Larsson, NP  hydrOXYzine (ATARAX/VISTARIL) 25 MG tablet Take 1 tablet (25 mg total) by mouth 3 (three) times daily. 01/15/21  Yes Noreene Larsson, NP  levETIRAcetam (KEPPRA) 750 MG tablet Take 1 tablet (750 mg total) by mouth 2 (two) times daily. 01/15/21  Yes Noreene Larsson, NP  omeprazole (PRILOSEC) 20 MG capsule Take 1 capsule (20 mg total) by mouth daily. 02/04/21  Yes Noreene Larsson, NP  promethazine (PHENERGAN) 12.5 MG tablet Take 1 tablet (12.5 mg total) by mouth every 8 (eight) hours as needed for nausea or vomiting. 01/15/21  Yes Noreene Larsson, NP  tamsulosin (FLOMAX) 0.4 MG CAPS capsule Take 0.4 mg by mouth in the morning and at bedtime. 08/20/20  Yes [provider]  traZODone (DESYREL) 150 MG tablet Take 1 tablet (150 mg total) by mouth at bedtime. 01/15/21  Yes Noreene Larsson, NP  triamcinolone cream (KENALOG) 0.1 % Apply 1 application topically 2 (two) times daily. 02/09/21  Yes Noreene Larsson, NP  ketoconazole (NIZORAL) 2 % cream Apply 1 application topically daily. 02/09/21   Noreene Larsson, NP   DG Hip Kaylyn Layer W or Wo Pelvis 2-3 Views Left  Result Date: 02/15/2021 CLINICAL DATA:  Status post fall. EXAM: DG HIP (WITH OR WITHOUT PELVIS) 2-3V LEFT COMPARISON:  None. FINDINGS: An acute fracture deformity is seen extending through the inter trochanteric region of the proximal left femur. There is no evidence of dislocation. Moderate severity degenerative changes are seen involving both hips in the form of joint space narrowing and acetabular sclerosis. IMPRESSION: Acute fracture of the proximal left femur. Electronically Signed   By: Virgina Norfolk M.D.   On: 02/15/2021 00:32    Positive ROS: All other systems have been reviewed and were otherwise negative with the exception of those mentioned in the HPI and as above.  Physical Exam: General: Alert, no acute distress Cardiovascular: No pedal edema Respiratory: No cyanosis, no use of accessory  musculature GI: No organomegaly, abdomen is soft and non-tender Skin: No lesions in the area of chief complaint Neurologic: Sensation intact distally Psychiatric: Patient is competent for consent with normal mood and affect Lymphatic: No axillary or cervical lymphadenopathy  MUSCULOSKELETAL: Left  Hip Exam:    Assessment: Left intertrochanteric femur fracture  Plan: Dr. Lyla Glassing saw the patient and reviewed the diagnosis of left hip fracture with him. We will plan for surgical intervention with IM nail tomorrow by Dr. Alvan Dame. NPO after midnight.    Irving Copas, PA-C Cell [phone]   02/15/2021 2:37 AM

## 2021-02-15 NOTE — Consult Note (Signed)
ORTHOPAEDIC CONSULTATION  REQUESTING PHYSICIAN: Kayleen Memos, DO  PCP:  Noreene Larsson, NP  Chief Complaint: Left hip injury  HPI: Roger Morton is a 50 y.o. male with a past medical history of brain surgery for tumor excision, stroke, gait disturbance requiring use of walker, hypertension, seizure disorder, BPH, neuropathy, tobacco abuse disorder, and GERD who tripped and fell at home onto his left hip.  He had left hip pain and inability to weight-bear.  He was brought to the emergency department at [facility], where x-rays revealed a left intertrochanteric femur fracture.  He was admitted by the hospitalist service.  Orthopedic consultation was placed for management of his left hip fracture.  He denies other injuries.  Past Medical History:  Diagnosis Date   Stroke [facility])    had brain tumor, and had CVAs during operation for his brain tumor   Past Surgical History:  Procedure Laterality Date   BRAIN SURGERY  2013   brain tumor excision   shunt intracranial x2     Social History   Socioeconomic History   Marital status: Single    Spouse name: Not on file   Number of children: 3   Years of education: Not on file   Highest education level: Not on file  Occupational History   Occupation: Disabled  Tobacco Use   Smoking status: Every Day    Packs/day: 1.50    Years: 25.00    Pack years: 37.50    Types: Cigarettes   Smokeless tobacco: Never  Vaping Use   Vaping Use: Some days   Substances: Nicotine  Substance and Sexual Activity   Alcohol use: Not Currently   Drug use: Not Currently   Sexual activity: Not Currently  Other Topics Concern   Not on file  Social History Narrative   2 in [place], the other daughter may be in [place], [place]   Social Determinants of Health   Financial Resource Strain: Low Risk    Difficulty of Paying Living Expenses: Not hard at all  Food Insecurity: No Food Insecurity   Worried About Charity fundraiser in the Last  Year: Never true   [place] in the Last Year: Never true  Transportation Needs: No Transportation Needs   Lack of Transportation (Medical): No   Lack of Transportation (Non-Medical): No  Physical Activity: Sufficiently Active   Days of Exercise per Week: 7 days   Minutes of Exercise per Session: 30 min  Stress: No Stress Concern Present   Feeling of Stress : Not at all  Social Connections: Socially Isolated   Frequency of Communication with Friends and Family: Three times a week   Frequency of Social Gatherings with Friends and Family: Three times a week   Attends Religious Services: Never   Active Member of Clubs or Organizations: No   Attends Archivist Meetings: Never   Marital Status: Never married   No family history on file. Allergies  Allergen Reactions   Penicillins    Prior to Admission medications   Medication Sig Start Date End Date Taking? Authorizing Provider  acetaminophen (TYLENOL) 325 MG tablet Take 650 mg by mouth daily.   Yes [provider]  atorvastatin (LIPITOR) 40 MG tablet Take 40 mg by mouth at bedtime.   Yes [provider]  baclofen (LIORESAL) 10 MG tablet Take 1 tablet (10 mg total) by mouth 3 (three) times daily. 02/04/21  Yes Noreene Larsson, NP  busPIRone (BUSPAR) 5 MG tablet Take 1 tablet (5 mg total) by mouth 2 (two) times daily. 01/15/21  Yes Noreene Larsson, NP  Carboxymethylcellul-Glycerin (CLEAR EYES FOR DRY EYES OP) Place 1 drop into the left eye in the morning and at bedtime.   Yes [provider]  celecoxib (CELEBREX) 200 MG capsule Take 1 capsule (200 mg total) by mouth daily. 01/15/21  Yes Noreene Larsson, NP  cetirizine (ZYRTEC) 10 MG tablet Take 1 tablet (10 mg total) by mouth daily. 01/15/21  Yes Noreene Larsson, NP  docusate sodium (COLACE) 100 MG capsule Take 1 capsule (100 mg total) by mouth daily. Patient taking differently: Take 100 mg by mouth 2 (two) times daily. 01/15/21  Yes Noreene Larsson, NP   gabapentin (NEURONTIN) 100 MG capsule Take 1 capsule (100 mg total) by mouth at bedtime. 01/15/21  Yes Noreene Larsson, NP  hydrochlorothiazide (HYDRODIURIL) 25 MG tablet Take 1 tablet (25 mg total) by mouth daily. 01/15/21  Yes Noreene Larsson, NP  hydrOXYzine (ATARAX/VISTARIL) 25 MG tablet Take 1 tablet (25 mg total) by mouth 3 (three) times daily. 01/15/21  Yes Noreene Larsson, NP  levETIRAcetam (KEPPRA) 750 MG tablet Take 1 tablet (750 mg total) by mouth 2 (two) times daily. 01/15/21  Yes Noreene Larsson, NP  omeprazole (PRILOSEC) 20 MG capsule Take 1 capsule (20 mg total) by mouth daily. 02/04/21  Yes Noreene Larsson, NP  promethazine (PHENERGAN) 12.5 MG tablet Take 1 tablet (12.5 mg total) by mouth every 8 (eight) hours as needed for nausea or vomiting. 01/15/21  Yes Noreene Larsson, NP  tamsulosin (FLOMAX) 0.4 MG CAPS capsule Take 0.4 mg by mouth in the morning and at bedtime. 08/20/20  Yes [provider]  traZODone (DESYREL) 150 MG tablet Take 1 tablet (150 mg total) by mouth at bedtime. 01/15/21  Yes Noreene Larsson, NP  triamcinolone cream (KENALOG) 0.1 % Apply 1 application topically 2 (two) times daily. 02/09/21  Yes Noreene Larsson, NP  ketoconazole (NIZORAL) 2 % cream Apply 1 application topically daily. 02/09/21   Noreene Larsson, NP   DG Hip Kaylyn Layer W or Wo Pelvis 2-3 Views Left  Result Date: 02/15/2021 CLINICAL DATA:  Status post fall. EXAM: DG HIP (WITH OR WITHOUT PELVIS) 2-3V LEFT COMPARISON:  None. FINDINGS: An acute fracture deformity is seen extending through the inter trochanteric region of the proximal left femur. There is no evidence of dislocation. Moderate severity degenerative changes are seen involving both hips in the form of joint space narrowing and acetabular sclerosis. IMPRESSION: Acute fracture of the proximal left femur. Electronically Signed   By: Virgina Norfolk M.D.   On: 02/15/2021 00:32    Positive ROS: All other systems have been reviewed and were otherwise  negative with the exception of those mentioned in the HPI and as above.  Physical Exam: General: Alert, no acute distress Cardiovascular: No pedal edema Respiratory: No cyanosis, no use of accessory musculature GI: No organomegaly, abdomen is soft and non-tender Skin: No lesions in the area of chief complaint Neurologic: Sensation intact distally Psychiatric: Patient is competent for consent with normal mood and affect Lymphatic: No axillary or cervical lymphadenopathy  MUSCULOSKELETAL: Examination of the left hip reveals no skin wounds or lesions.  He holds both hips and knees in a flexed position.  He has pain with attempted logrolling of the hip.  He reports sensory change to the foot due to neuropathy.  His foot is perfused.  He has positive motor function dorsiflexion, plantar flexion, and great toe extension.  Assessment: Left intertrochanteric femur fracture  Plan: I discussed the findings with the patient.  He has an unstable, displaced left intertrochanteric femur fracture that requires surgical stabilization.  We discussed the risk, benefits, and alternatives to intramedullary fixation of the left hip.  Plan for surgery later today with Dr. Alvan Dame.  Hold chemical DVT prophylaxis.  Continue NPO.  Preoperative orders placed.  All questions solicited and answered.  The risks, benefits, and alternatives were discussed with the patient. There are risks associated with the surgery including, but not limited to, problems with anesthesia (death), infection, differences in leg length/angulation/rotation, fracture of bones, loosening or failure of implants, malunion, nonunion, hematoma (blood accumulation) which may require surgical drainage, blood clots, pulmonary embolism, nerve injury (foot drop), and blood vessel injury. The patient understands these risks and elects to proceed.   Bertram Savin, MD [phone]    02/15/2021 4:12 AM

## 2021-02-15 NOTE — Anesthesia Postprocedure Evaluation (Signed)
Anesthesia Post Note  Patient: Roger Morton  Procedure(s) Performed: INTRAMEDULLARY (IM) NAIL INTERTROCHANTRIC (Left: Hip)     Patient location during evaluation: PACU Anesthesia Type: General Level of consciousness: awake and alert Pain management: pain level controlled Vital Signs Assessment: post-procedure vital signs reviewed and stable Respiratory status: spontaneous breathing, nonlabored ventilation, respiratory function stable and patient connected to nasal cannula oxygen Cardiovascular status: blood pressure returned to baseline and stable Postop Assessment: no apparent nausea or vomiting Anesthetic complications: no   No notable events documented.  Last Vitals:  Vitals:   02/15/21 1135 02/15/21 1207  BP: (!) 130/92 (!) 126/105  Pulse: 95 (!) 101  Resp: (!) 22 20  Temp: (!) 36.4 C 36.9 C  SpO2: 100% 93%    Last Pain:  Vitals:   02/15/21 1441  TempSrc:   PainSc: [place]

## 2021-02-15 NOTE — Interval H&P Note (Signed)
History and Physical Interval Note:  02/15/2021 9:18 AM  Roger Morton  has presented today for surgery, with the diagnosis of Left Femur fracture.  The various methods of treatment have been discussed with the patient and family. After consideration of risks, benefits and other options for treatment, the patient has consented to  Procedure(s): INTRAMEDULLARY (IM) NAIL INTERTROCHANTRIC (Left) as a surgical intervention.  The patient's history has been reviewed, patient examined, no change in status, stable for surgery.  I have reviewed the patient's chart and labs.  Questions were answered to the patient's satisfaction.     Mauri Pole

## 2021-02-15 NOTE — Progress Notes (Addendum)
Patient ID: Roger Morton, male   DOB: 11/01/1970, 50 y.o.   MRN: [id number]  Left intertrochanteric hip fracture  LLE flexed at hip and knee which he states is due to the pain Reports no significant contractures  NPO To OR today for ORIF Consent obtained  Post op orders and direction to follow

## 2021-02-16 DIAGNOSIS — E785 Hyperlipidemia, unspecified: Secondary | ICD-10-CM

## 2021-02-16 DIAGNOSIS — D72829 Elevated white blood cell count, unspecified: Secondary | ICD-10-CM

## 2021-02-16 LAB — BASIC METABOLIC PANEL
Anion gap: 10 (ref 5–15)
BUN: 11 mg/dL (ref 6–20)
CO2: 27 mmol/L (ref 22–32)
Calcium: 8.9 mg/dL (ref 8.9–10.3)
Chloride: 101 mmol/L (ref 98–111)
Creatinine, Ser: 0.69 mg/dL (ref 0.61–1.24)
GFR, Estimated: >60 mL/min (ref >60)
Glucose, Bld: 122 mg/dL — ABNORMAL HIGH (ref 70–99)
Potassium: 3.7 mmol/L (ref 3.5–5.1)
Sodium: 138 mmol/L (ref 135–145)

## 2021-02-16 LAB — CBC
HCT: 37.6 % — ABNORMAL LOW (ref 39.0–52.0)
Hemoglobin: 12.7 g/dL — ABNORMAL LOW (ref 13.0–17.0)
MCH: 28.9 pg (ref 26.0–34.0)
MCHC: 33.8 g/dL (ref 30.0–36.0)
MCV: 85.5 fL (ref 80.0–100.0)
Platelets: 252 10*3/uL (ref 150–400)
RBC: 4.4 MIL/uL (ref 4.22–5.81)
RDW: 13.9 % (ref 11.5–15.5)
WBC: 13.3 10*3/uL — ABNORMAL HIGH (ref 4.0–10.5)
nRBC: 0 % (ref 0.0–0.2)

## 2021-02-16 MED ORDER — ASPIRIN 81 MG PO CHEW
81.0000 mg | CHEWABLE_TABLET | Freq: Two times a day (BID) | ORAL | Status: DC
  Administered 2021-02-16 – 2021-02-20 (×9): 81 mg via ORAL
  Filled 2021-02-16 (×9): qty 1

## 2021-02-16 MED ORDER — POLYETHYLENE GLYCOL 3350 17 G PO PACK: 17.0000 g | PACK | Freq: Every day | ORAL | 0 refills | Status: DC | PRN

## 2021-02-16 MED ORDER — ASPIRIN 81 MG PO CHEW: 81.0000 mg | CHEWABLE_TABLET | Freq: Two times a day (BID) | ORAL | 0 refills | Status: AC

## 2021-02-16 MED ORDER — METHOCARBAMOL 500 MG PO TABS: 500.0000 mg | ORAL_TABLET | Freq: Four times a day (QID) | ORAL | 0 refills | Status: DC | PRN

## 2021-02-16 MED ORDER — OXYCODONE HCL 5 MG PO TABS: 5.0000 mg | ORAL_TABLET | Freq: Four times a day (QID) | ORAL | 0 refills | Status: DC | PRN

## 2021-02-16 NOTE — Progress Notes (Signed)
   Subjective: 1 Day Post-Op Procedure(s) (LRB): INTRAMEDULLARY (IM) NAIL INTERTROCHANTRIC (Left) Patient reports pain as moderate.   Patient seen in rounds for Dr. Alvan Dame. Patient is resting in bed this morning on exam. He reports he has had significant pain in the hip and thigh, but is otherwise doing well. He denies CP, SHOB, N/V. He does normally walk with a walker, but has not been up with PT since surgery. We will start therapy today.   Objective: Vital signs in last 24 hours: Temp:  [98.2 F (36.8 C)-99.2 F (37.3 C)] 98.2 F (36.8 C) (06/27 0742) Pulse Rate:  [89-102] 89 (06/27 0742) Resp:  [17-18] 17 (06/27 0742) BP: (119-139)/(63-104) 119/63 (06/27 0742) SpO2:  [94 %-97 %] 96 % (06/27 0742)  Intake/Output from previous day:  Intake/Output Summary (Last 24 hours) at 02/16/2021 1304 Last data filed at 02/16/2021 1000 Gross per 24 hour  Intake 100 ml  Output 1000 ml  Net -900 ml     Intake/Output this shift: Total I/O In: -  Out: 600 [Urine:600]  Labs: Recent Labs    02/14/21 2314 02/15/21 0501 02/16/21 0145  HGB 14.0 13.8 12.7*   Recent Labs    02/15/21 0501 02/16/21 0145  WBC 11.3* 13.3*  RBC 4.86 4.40  HCT 41.7 37.6*  PLT 263 252   Recent Labs    02/15/21 0501 02/16/21 0145  NA 138 138  K 3.5 3.7  CL 104 101  CO2 24 27  BUN 15 11  CREATININE 0.64 0.69  GLUCOSE 140* 122*  CALCIUM 9.0 8.9   No results for input(s): LABPT, INR in the last 72 hours.  Exam: General - Patient is Alert and Oriented Extremity - Neurologically intact Sensation intact distally Intact pulses distally Dorsiflexion/Plantar flexion intact Dressing - dressing C/D/I Motor Function - intact, moving foot and toes well on exam.   Past Medical History:  Diagnosis Date   Stroke ([place])    had brain tumor, and had CVAs during operation for his brain tumor    Assessment/Plan: 1 Day Post-Op Procedure(s) (LRB): INTRAMEDULLARY (IM) NAIL INTERTROCHANTRIC (Left) Active  Problems:   Seizure disorder (HCC)   Femur fracture (HCC)   Leukocytosis   Hyperlipidemia   GERD (gastroesophageal reflux disease)  Estimated body mass index is 29.4 kg/m as calculated from the following:   Height as of this encounter: 6\' 2"  (1.88 m).   Weight as of this encounter: 103.9 kg. Advance diet Up with therapy  DVT Prophylaxis - Aspirin 81 BID x 4 weeks PWB LLE 50%  Plan to get up with PT today to work on ambulation and practicing PWB status. Per chart review, it sounds that the plan is likely d/c to SNF. He is ready for d/c from an orthopaedic standpoint once evaluated by PT. Printed prescriptions placed in his chart. Dressings are waterproof and may remain in place until follow up.  Follow up with Dr. Alvan Dame in 2 weeks.   Griffith Citron, PA-C Orthopedic Surgery [phone] 02/16/2021, 1:04 PM

## 2021-02-16 NOTE — Progress Notes (Signed)
PROGRESS NOTE    [name]  BHA:[id number] DOB: 08/23/1971 DOA: 02/14/2021 PCP: Roger Larsson, NP   Brief Narrative: Roger Morton is a 50 y.o. male with a history CVA, seizure disorder, neuropathy, hypertension, BPH, GERD. Patient presented after a fall and resultant left hip fracture. Orthopedic surgery consulted.   Assessment & Plan:   Active Problems:   Seizure disorder (HCC)   Femur fracture (HCC)   Leukocytosis   Hyperlipidemia   GERD (gastroesophageal reflux disease)   Left proximal femur fracture Secondary to fall. Orthopedic surgery consulted. IM nail placement performed on 6/26. Partial weight bearing recommended. PT/OT consulted and recommendations are pending.  Leukocytosis Likely reactive. WBC of 13.7k. Mild. Stable. No evidence of infection at this time.  Seizure disorder -Continue home Keppra  Hypokalemia Mild. Resolved.  Neuropathy Contractures -Continue gabapentin  Hyperlipidemia -Continue Lipitor  BPH -Continue tamsulosin  GERD -Continue Protonix  Tobacco use -Continue nicotine patch   DVT prophylaxis: SCDs, otherwise per surgery Code Status:   Code Status: Full Code Family Communication: None at bedside Disposition Plan: Discharge likely to SNF pending PT/OT recommendations and stable hemoglobin   Consultants:  Orthopedic surgery  Procedures:  LEFT INTRAMEDULLARY NAIL (02/15/2021)  Antimicrobials: None    Subjective: Leg pain today. No other concerns.  Objective: Vitals:   02/15/21 1207 02/15/21 1521 02/15/21 2145 02/16/21 0742  BP: (!) 126/105 (!) 139/104 131/83 119/63  Pulse: (!) 101 (!) 102 97 89  Resp: 20 18 17 17   Temp: 98.4 F (36.9 C) 98.3 F (36.8 C) 99.2 F (37.3 C) 98.2 F (36.8 C)  TempSrc: Oral Oral Oral Oral  SpO2: 93% 94% 97% 96%  Weight:      Height:        Intake/Output Summary (Last 24 hours) at 02/16/2021 1035 Last data filed at 02/15/2021 1822 Gross per 24 hour  Intake 700 ml   Output 400 ml  Net 300 ml    Filed Weights   02/14/21 2112  Weight: 103.9 kg    Examination:  General exam: Appears calm and comfortable Respiratory system: Clear to auscultation. Respiratory effort normal. Cardiovascular system: S1 & S2 heard, RRR. No murmurs, rubs, gallops or clicks. Gastrointestinal system: Abdomen is nondistended, soft and nontender. No organomegaly or masses felt. Normal bowel sounds heard. Central nervous system: Alert and oriented. Left sided facial droop. No acute focal changes. Musculoskeletal: No edema. No calf tenderness Skin: No cyanosis. No rashes Psychiatry: Judgement and insight appear normal. Mood & affect appropriate.     Data Reviewed: I have personally reviewed following labs and imaging studies  CBC Lab Results  Component Value Date   WBC 13.3 (H) 02/16/2021   RBC 4.40 02/16/2021   HGB 12.7 (L) 02/16/2021   HCT 37.6 (L) 02/16/2021   MCV 85.5 02/16/2021   MCH 28.9 02/16/2021   PLT 252 02/16/2021   MCHC 33.8 02/16/2021   RDW 13.9 02/16/2021   LYMPHSABS 1.5 02/14/2021   MONOABS 0.9 02/14/2021   EOSABS 0.0 02/14/2021   BASOSABS 0.0 97/35/3299     Last metabolic panel Lab Results  Component Value Date   NA 138 02/16/2021   K 3.7 02/16/2021   CL 101 02/16/2021   CO2 27 02/16/2021   BUN 11 02/16/2021   CREATININE 0.69 02/16/2021   GLUCOSE 122 (H) 02/16/2021   GFRNONAA >60 02/16/2021   CALCIUM 8.9 02/16/2021   PHOS 2.9 02/15/2021   PROT 6.4 (L) 02/14/2021   ALBUMIN 3.6 02/14/2021   LABGLOB 2.7  01/29/2021   AGRATIO 1.7 01/29/2021   BILITOT 0.5 02/14/2021   ALKPHOS 70 02/14/2021   AST 18 02/14/2021   ALT 14 02/14/2021   ANIONGAP 10 02/16/2021    CBG (last 3)  No results for input(s): GLUCAP in the last 72 hours.   GFR: Estimated Creatinine Clearance: 140.5 mL/min (by C-G formula based on SCr of 0.69 mg/dL).  Coagulation Profile: No results for input(s): INR, PROTIME in the last 168 hours.  Recent Results (from  the past 240 hour(s))  Resp Panel by RT-PCR (Flu A&B, Covid) Nasopharyngeal Swab     Status: None   Collection Time: 02/14/21 11:15 PM   Specimen: Nasopharyngeal Swab; Nasopharyngeal(NP) swabs in vial transport medium  Result Value Ref Range Status   SARS Coronavirus 2 by RT PCR NEGATIVE NEGATIVE Final    Comment: (NOTE) SARS-CoV-2 target nucleic acids are NOT DETECTED.  The SARS-CoV-2 RNA is generally detectable in upper respiratory specimens during the acute phase of infection. The lowest concentration of SARS-CoV-2 viral copies this assay can detect is 138 copies/mL. A negative result does not preclude SARS-Cov-2 infection and should not be used as the sole basis for treatment or other patient management decisions. A negative result may occur with  improper specimen collection/handling, submission of specimen other than nasopharyngeal swab, presence of viral mutation(s) within the areas targeted by this assay, and inadequate number of viral copies(<138 copies/mL). A negative result must be combined with clinical observations, patient history, and epidemiological information. The expected result is Negative.  Fact Sheet for Patients:  EntrepreneurPulse.com.au  Fact Sheet for Healthcare Providers:  IncredibleEmployment.be  This test is no t yet approved or cleared by the [place] FDA and  has been authorized for detection and/or diagnosis of SARS-CoV-2 by FDA under an Emergency Use Authorization (EUA). This EUA will remain  in effect (meaning this test can be used) for the duration of the COVID-19 declaration under Section 564(b)(1) of the Act, 21 U.S.C.section 360bbb-3(b)(1), unless the authorization is terminated  or revoked sooner.       Influenza A by PCR NEGATIVE NEGATIVE Final   Influenza B by PCR NEGATIVE NEGATIVE Final    Comment: (NOTE) The Xpert Xpress SARS-CoV-2/FLU/RSV plus assay is intended as an aid in the diagnosis of  influenza from Nasopharyngeal swab specimens and should not be used as a sole basis for treatment. Nasal washings and aspirates are unacceptable for Xpert Xpress SARS-CoV-2/FLU/RSV testing.  Fact Sheet for Patients: EntrepreneurPulse.com.au  Fact Sheet for Healthcare Providers: IncredibleEmployment.be  This test is not yet approved or cleared by the [place] FDA and has been authorized for detection and/or diagnosis of SARS-CoV-2 by FDA under an Emergency Use Authorization (EUA). This EUA will remain in effect (meaning this test can be used) for the duration of the COVID-19 declaration under Section 564(b)(1) of the Act, 21 U.S.C. section 360bbb-3(b)(1), unless the authorization is terminated or revoked.  Performed at [facility], [place] [address] [place], [place] [address]   Surgical PCR screen     Status: Abnormal   Collection Time: 02/15/21  4:34 AM   Specimen: Nasal Mucosa; Nasal Swab  Result Value Ref Range Status   MRSA, PCR POSITIVE (A) NEGATIVE Final    Comment: RESULT CALLED TO, READ BACK BY AND VERIFIED WITH: A,DAVY RN @1155  02/15/21 EB    Staphylococcus aureus POSITIVE (A) NEGATIVE Final    Comment: (NOTE) The Xpert SA Assay (FDA approved for NASAL specimens in patients 24 years of age and  older), is one component of a comprehensive surveillance program. It is not intended to diagnose infection nor to guide or monitor treatment. Performed at [facility], [place] [address] [place], [place] [address]         Radiology Studies: DG C-Arm 1-60 Min  Result Date: 02/15/2021 CLINICAL DATA:  Left hip fracture repair. EXAM: DG C-ARM 1-60 MIN FLUOROSCOPY TIME:  Fluoroscopy Time:  1 minutes 41 seconds Radiation Exposure Index (if provided by the fluoroscopic device): 31.89 mGy Number of Acquired Spot Images: 2 COMPARISON:  February 14, 2021 FINDINGS: A gamma nail and intramedullary rod have been placed across the  intertrochanteric fracture with improved alignment. A distal interlocking screw is noted. IMPRESSION: Left hip fracture repair as above. Electronically Signed   By: Dorise Bullion III M.D   On: 02/15/2021 13:06   DG HIP OPERATIVE UNILAT WITH PELVIS LEFT  Result Date: 02/15/2021 CLINICAL DATA:  Left hip fracture repair. EXAM: DG C-ARM 1-60 MIN FLUOROSCOPY TIME:  Fluoroscopy Time:  1 minutes 41 seconds Radiation Exposure Index (if provided by the fluoroscopic device): 31.89 mGy Number of Acquired Spot Images: 2 COMPARISON:  February 14, 2021 FINDINGS: A gamma nail and intramedullary rod have been placed across the intertrochanteric fracture with improved alignment. A distal interlocking screw is noted. IMPRESSION: Left hip fracture repair as above. Electronically Signed   By: Dorise Bullion III M.D   On: 02/15/2021 13:06   DG Hip Unilat W or Wo Pelvis 2-3 Views Left  Result Date: 02/15/2021 CLINICAL DATA:  Status post fall. EXAM: DG HIP (WITH OR WITHOUT PELVIS) 2-3V LEFT COMPARISON:  None. FINDINGS: An acute fracture deformity is seen extending through the inter trochanteric region of the proximal left femur. There is no evidence of dislocation. Moderate severity degenerative changes are seen involving both hips in the form of joint space narrowing and acetabular sclerosis. IMPRESSION: Acute fracture of the proximal left femur. Electronically Signed   By: Virgina Norfolk M.D.   On: 02/15/2021 00:32        Scheduled Meds:  aspirin  81 mg Oral BID   atorvastatin  40 mg Oral QHS   baclofen  10 mg Oral TID   busPIRone  5 mg Oral BID   docusate sodium  100 mg Oral BID   gabapentin  100 mg Oral QHS   levETIRAcetam  750 mg Oral BID   melatonin  3 mg Oral QHS   mupirocin ointment  1 application Nasal BID   nicotine  14 mg Transdermal Daily   pantoprazole  40 mg Oral Daily   senna-docusate  2 tablet Oral QHS   tamsulosin  0.4 mg Oral QPC breakfast   Continuous Infusions:  methocarbamol (ROBAXIN)  IV       LOS: 1 day     Cordelia Poche, MD [facility] 02/16/2021, 10:35 AM  If 7PM-7AM, please contact night-coverage [email or web address]

## 2021-02-16 NOTE — Progress Notes (Addendum)
Occupational Therapy Evaluation Patient Details Name: Roger Morton MRN: [id number] DOB: 1971-04-23 Today's Date: 02/16/2021    History of Present Illness Roger Morton is a 50 y.o. male presented [facility] ED 6/25 after a mechanical fall at home and resultant left hip fracture; IM placement 6/26. PMH: history CVA, seizure disorder, neuropathy, hypertension, BPH, GERD.   Clinical Impression   Roger Morton was evaluated s/p the above fall and L hip fracture. PT pt was mod I with all ADLs and ambulated with rollator. He lives with his brother and his sister-in-law in a 1 level home with 3 STE. Upon evaluation pt demonstrated severely increased pain with LE movement. Pt required mod A for bed mobility. And was unable to complete sit<>stand, or boost bottom from bed given max A +1 due to pain. Pt unable to reach bilat feet for donning socks, see ADL levels below. Pt would benefit from +2 assistance to progress OOB mobility. Pt also will benefit from continued OT acutely to progress function in mobility and ADLs. Recommend SNF at d/c.    Follow Up Recommendations  SNF;Supervision/Assistance - 24 hour    Equipment Recommendations  None recommended by OT (defer to next venue)       Precautions / Restrictions Precautions Precautions: Fall Restrictions Weight Bearing Restrictions: Yes LLE Weight Bearing: Partial weight bearing LLE Partial Weight Bearing Percentage or Pounds: 50      Mobility Bed Mobility Overal bed mobility: Needs Assistance Bed Mobility: Supine to Sit;Sit to Supine     Supine to sit: Mod assist;HOB elevated Sit to supine: Mod assist;HOB elevated   General bed mobility comments: mod A for LLE management and trunk elevation    Transfers Overall transfer level: Needs assistance Equipment used: Rolling walker (2 wheeled) Transfers: Sit to/from Stand Sit to Stand: Max assist;+2 physical assistance;+2 safety/equipment         General transfer comment: attempted  sit<>stand x3; pt unable to clear bottom off of bed when boosting given max A +1 due to pain    Balance Overall balance assessment: Needs assistance;History of Falls Sitting-balance support: No upper extremity supported Sitting balance-Leahy Scale: Fair     Standing balance support: Bilateral upper extremity supported Standing balance-Leahy Scale: Zero        ADL either performed or assessed with clinical judgement   ADL Overall ADL's : Needs assistance/impaired Eating/Feeding: Set up;Sitting   Grooming: Wash/dry hands;Wash/dry face;Oral care;Applying deodorant;Set up;Sitting   Upper Body Bathing: Minimal assistance;Sitting   Lower Body Bathing: Maximal assistance;+2 for physical assistance;+2 for safety/equipment;Sit to/from stand   Upper Body Dressing : Set up;Sitting   Lower Body Dressing: Maximal assistance;+2 for physical assistance;+2 for safety/equipment;Sit to/from stand   Toilet Transfer: Maximal assistance;+2 for physical assistance;+2 for safety/equipment;Stand-pivot   Toileting- Clothing Manipulation and Hygiene: Maximal assistance;+2 for physical assistance;+2 for safety/equipment;Sit to/from stand       Functional mobility during ADLs: Maximal assistance;+2 for physical assistance;+2 for safety/equipment (bed mobility mod A +1) General ADL Comments: lower body ADLs at bed level this session, pt would benefit from +2 assistance to complete sit<>stand for ADLs in standing     Vision Patient Visual Report: No change from baseline Vision Assessment?: No apparent visual deficits            Pertinent Vitals/Pain Pain Assessment: Faces Pain Score: 6  Faces Pain Scale: Hurts whole lot Pain Location: L hip/upper leg Pain Descriptors / Indicators: Shooting;Moaning;Grimacing;Guarding Pain Intervention(s): Monitored during session;Limited activity within patient's tolerance  Hand Dominance Right   Extremity/Trunk Assessment Upper Extremity  Assessment Upper Extremity Assessment: LUE deficits/detail LUE Deficits / Details: Shouler ROM limited to ~90* flex/abduction, elbow wrist and hand are [facility] for ROM, globally 3+/5 MMT of LUE LUE Sensation: WNL LUE Coordination: decreased fine motor;decreased gross motor   Lower Extremity Assessment Lower Extremity Assessment: Defer to PT evaluation   Cervical / Trunk Assessment Cervical / Trunk Assessment: Kyphotic   Communication Communication Communication: No difficulties   Cognition Arousal/Alertness: Awake/alert Behavior During Therapy: WFL for tasks assessed/performed;Anxious Overall Cognitive Status: Within Functional Limits for tasks assessed           General Comments: Pt anxious for movement and associated pain   General Comments  Pt with skin wounds on L neck, sx dressing dry and intact, L sided weakness globally     Home Living Family/patient expects to be discharged to:: Private residence Living Arrangements: Other relatives (brother & his wife) Available Help at Discharge: Other (Comment) (pt reports his brother and wife are not able to assist him at discharge) Type of Home: House Home Access: Stairs to enter [employer] of Steps: 3 Entrance Stairs-Rails: Can reach both Home Layout: One level     Bathroom Shower/Tub: Walk-in shower;Door   [employer] Toilet: Handicapped height Bathroom Accessibility: Yes How Accessible: Accessible via walker Home Equipment: Grab bars - tub/shower;Walker - 4 wheels;Shower seat          Prior Functioning/Environment Level of Independence: Independent with assistive device(s)        Comments: pt uses rollator for ambulation - scooters when available at stores        OT Problem List: Decreased strength;Decreased range of motion;Decreased activity tolerance;Impaired balance (sitting and/or standing);Decreased safety awareness;Decreased knowledge of use of DME or AE;Decreased knowledge of precautions;Pain       OT Treatment/Interventions: Self-care/ADL training;Therapeutic exercise;Therapeutic activities;Patient/family education;Balance training    OT Goals(Current goals can be found in the care plan section) Acute Rehab OT Goals Patient Stated Goal: less pain OT Goal Formulation: With patient Time For Goal Achievement: 03/02/21 Potential to Achieve Goals: Fair ADL Goals Pt Will Perform Grooming: standing;with supervision Pt Will Perform Lower Body Bathing: with set-up;sit to/from stand Pt Will Perform Lower Body Dressing: with set-up;sit to/from stand Pt Will Transfer to Toilet: with min guard assist;bedside commode;stand pivot transfer Pt Will Perform Toileting - Clothing Manipulation and hygiene: with min guard assist  OT Frequency: Min 2X/week   Barriers to D/C: Decreased caregiver support  Pt reports that his brother & wife are now declining to care for pt at d/c          AM-PAC OT "6 Clicks" Daily Activity     Outcome Measure Help from another person eating meals?: A Little Help from another person taking care of personal grooming?: A Little Help from another person toileting, which includes using toliet, bedpan, or urinal?: A Lot Help from another person bathing (including washing, rinsing, drying)?: A Lot Help from another person to put on and taking off regular upper body clothing?: A Little Help from another person to put on and taking off regular lower body clothing?: A Lot 6 Click Score: 15   End of Session Equipment Utilized During Treatment: Gait belt;Rolling walker Nurse Communication: Mobility status;Precautions;Weight bearing status  Activity Tolerance: Patient limited by pain Patient left: in bed;with call bell/phone within reach;with bed alarm set  OT Visit Diagnosis: Unsteadiness on feet (R26.81);Muscle weakness (generalized) (M62.81);History of falling (Z91.81);Other abnormalities of gait and mobility (R26.89);Pain;Hemiplegia and  hemiparesis Hemiplegia -  Right/Left: Left Hemiplegia - dominant/non-dominant: Non-Dominant Pain - Right/Left: Left Pain - part of body: Leg                Time: 1050-1118 OT Time Calculation (min): 28 min Charges:  OT General Charges $OT Visit: 1 Visit OT Evaluation $OT Eval Moderate Complexity: 1 Mod OT Treatments $Therapeutic Activity: 8-22 mins    Darrik Richman A Sharis Keeran 02/16/2021, 12:31 PM

## 2021-02-16 NOTE — TOC Initial Note (Signed)
Transition of Care [facility]) - Initial/Assessment Note    Patient Details  Name: Roger Morton MRN: [id number] Date of Birth: [birth year]  Transition of Care [facility]) CM/SW Contact:    Milinda Antis, [place] Phone Number: 02/16/2021, 5:10 PM  Clinical Narrative:                  CSW received consult for possible SNF placement at time of discharge. CSW spoke with patient.  Patient expressed understanding of PT recommendation and is agreeable to SNF placement at time of discharge. Patient reports preference for a facility in [place]. CSW discussed insurance authorization process.   Patient expressed being hopeful for rehab and to feel better soon. No further questions reported at this time.         Patient Goals and CMS Choice        Expected Discharge Plan and Services                                                Prior Living Arrangements/Services                       Activities of Daily Living Home Assistive Devices/Equipment: Gilford Rile (specify type) ADL Screening (condition at time of admission) Patient's cognitive ability adequate to safely complete daily activities?: Yes Is the patient deaf or have difficulty hearing?: No Does the patient have difficulty seeing, even when wearing glasses/contacts?: No Does the patient have difficulty concentrating, remembering, or making decisions?: No Patient able to express need for assistance with ADLs?: Yes Does the patient have difficulty dressing or bathing?: Yes Independently performs ADLs?: Yes (appropriate for developmental age) Does the patient have difficulty walking or climbing stairs?: Yes Weakness of Legs: Both Weakness of Arms/Hands: Right  Permission Sought/Granted                  Emotional Assessment              Admission diagnosis:  Femur fracture ([place]) [S72.90XA] Closed displaced fracture of proximal epiphysis of left femur, initial encounter ([place]) [S72.022A] Patient  Active Problem List   Diagnosis Date Noted   Femur fracture ([place]) 02/15/2021   Leukocytosis 02/15/2021   Hyperlipidemia 02/15/2021   GERD (gastroesophageal reflux disease) 02/15/2021   Left-sided weakness 02/09/2021   Chronic pain 02/02/2021   Rash 02/02/2021   Encounter for general adult medical examination with abnormal findings 01/14/2021   Screening due 01/14/2021   Stroke ([place]) 01/14/2021   Seizure disorder ([place]) 01/14/2021   Chest pain 01/14/2021   Gait disorder 01/14/2021   Left knee pain 01/14/2021   Rhinorrhea 01/14/2021   PCP:  Noreene Larsson, NP Pharmacy:   [facility] [phone] - , [place] - [address] AT [place] [place] [address] Phone: [phone] Fax: [phone]     Social Determinants of Health (SDOH) Interventions    Readmission Risk Interventions No flowsheet data found.

## 2021-02-16 NOTE — Evaluation (Signed)
Physical Therapy Evaluation Patient Details Name: Roger Morton MRN: [id number] DOB: [birth year] Today's Date: 02/16/2021   History of Present Illness  Roger Morton is a 50 y.o. male presented to [facility] ED 02/14/21 after a mechanical fall at home and resultant left hip fracture; IM Nail placement 6/26. PMH: history CVA, seizure disorder, neuropathy, hypertension, BPH, GERD.  Clinical Impression  Pt was able to sit EOB with me today min assist overall to get to EOB significant extra time needed due to pain.  He reported he had nothing for pain since this morning.  RN brought pain meds during my session, but pt was too painful to attempt standing (he did attempt standing with OT earlier this morning).  He will likely need significant post acute rehab to get back to walking again.   PT to follow acutely for deficits listed below.       Follow Up Recommendations SNF    Equipment Recommendations  Wheelchair (measurements PT);Wheelchair cushion (measurements PT);Hospital bed    Recommendations for Other Services       Precautions / Restrictions Precautions Precautions: Fall Precaution Comments: left sided weakness secondary to h/o stroke Restrictions Weight Bearing Restrictions: Yes LLE Weight Bearing: Partial weight bearing LLE Partial Weight Bearing Percentage or Pounds: 50%      Mobility  Bed Mobility Overal bed mobility: Needs Assistance Bed Mobility: Supine to Sit;Sit to Supine     Supine to sit: Min assist;HOB elevated Sit to supine: Min assist;HOB elevated   General bed mobility comments: Pt insisted on trying to move EOB and back without help, but every time he tried he would scream in pain, therapist assisted as best he would let me.    Transfers Overall transfer level: Needs assistance Equipment used: Rolling walker (2 wheeled) Transfers: Sit to/from Stand Sit to Stand: Max assist;+2 physical assistance;+2 safety/equipment         General transfer comment: Pt  reported too painful to try.  Did attempt with OT earlier today.  Ambulation/Gait             General Gait Details: unable at this time.  Stairs            Wheelchair Mobility    Modified Rankin (Stroke Patients Only)       Balance Overall balance assessment: Needs assistance Sitting-balance support: Feet supported;Bilateral upper extremity supported Sitting balance-Leahy Scale: Fair     Standing balance support: Bilateral upper extremity supported Standing balance-Leahy Scale: Zero                               Pertinent Vitals/Pain Pain Assessment: Faces Pain Score: 6  Faces Pain Scale: Hurts whole lot Pain Location: L hip/upper leg Pain Descriptors / Indicators: Grimacing;Guarding Pain Intervention(s): Limited activity within patient's tolerance;Monitored during session;Repositioned;RN gave pain meds during session (pt did not want ice when offered)    Home Living Family/patient expects to be discharged to:: Private residence Living Arrangements: Other relatives (brother & his wife) Available Help at Discharge: Other (Comment) (pt reports his brother and wife are not able to assist him at discharge) Type of Home: House Home Access: Stairs to enter Entrance Stairs-Rails: Can reach both Entrance Stairs-Number of Steps: 3 Home Layout: One level Home Equipment: Grab bars - tub/shower;Walker - 4 wheels;Shower seat      Prior Function Level of Independence: Independent with assistive device(s)         Comments: pt uses rollator  for ambulation - scooters when available at stores, does stairs with rail unassisted.     Hand Dominance   Dominant Hand: Right    Extremity/Trunk Assessment   Upper Extremity Assessment Upper Extremity Assessment: Defer to OT evaluation LUE Deficits / Details: Shouler ROM limited to ~90* flex/abduction, elbow wrist and hand are [facility] for ROM, globally 3+/5 MMT of LUE LUE Sensation: WNL LUE Coordination:  decreased fine motor;decreased gross motor    Lower Extremity Assessment Lower Extremity Assessment: RLE deficits/detail;LLE deficits/detail RLE Deficits / Details: right leg tremulous, pt reports this is from pain, ankle 3/5, knee 3-/5, hip 3-/5 due to movement of his right leg produced pain in his left leg. RLE: Unable to fully assess due to pain LLE Deficits / Details: ankle 2+/5, knee trace, hip trace, guarded, painful and spasms.    Cervical / Trunk Assessment Cervical / Trunk Assessment: Kyphotic  Communication   Communication: Expressive difficulties (due to h/o stroke)  Cognition Arousal/Alertness: Awake/alert Behavior During Therapy: WFL for tasks assessed/performed;Anxious Overall Cognitive Status: History of cognitive impairments - at baseline                                 General Comments: Pt with baseline h/o strokes, brain tumor resection, and recent concussion.      General Comments General comments (skin integrity, edema, etc.): Pt with skin wounds on L neck, sx dressing dry and intact, L sided weakness globally    Exercises     Assessment/Plan    PT Assessment Patient needs continued PT services  PT Problem List Decreased strength;Decreased range of motion;Decreased activity tolerance;Decreased balance;Decreased mobility;Decreased knowledge of use of DME;Pain       PT Treatment Interventions DME instruction;Stair training;Gait training;Therapeutic activities;Functional mobility training;Therapeutic exercise;Balance training;Patient/family education;Wheelchair mobility training;Manual techniques;Modalities    PT Goals (Current goals can be found in the Care Plan section)  Acute Rehab PT Goals Patient Stated Goal: to get back on his feet to go home, likes to hunt and fish PT Goal Formulation: With patient Time For Goal Achievement: 03/02/21 Potential to Achieve Goals: Good    Frequency Min 3X/week   Barriers to discharge         Co-evaluation               AM-PAC PT "6 Clicks" Mobility  Outcome Measure Help needed turning from your back to your side while in a flat bed without using bedrails?: A Lot Help needed moving from lying on your back to sitting on the side of a flat bed without using bedrails?: A Little Help needed moving to and from a bed to a chair (including a wheelchair)?: Total Help needed standing up from a chair using your arms (e.g., wheelchair or bedside chair)?: Total Help needed to walk in hospital room?: Total Help needed climbing 3-5 steps with a railing? : Total 6 Click Score: 9    End of Session   Activity Tolerance: Patient limited by pain Patient left: in bed;with call bell/phone within reach Nurse Communication: Patient requests pain meds PT Visit Diagnosis: Muscle weakness (generalized) (M62.81);Difficulty in walking, not elsewhere classified (R26.2);Pain Pain - Right/Left: Left Pain - part of body: Hip    Time: 2694-8546 PT Time Calculation (min) (ACUTE ONLY): 33 min   Charges:   PT Evaluation $PT Eval Moderate Complexity: 1 Mod PT Treatments $Therapeutic Activity: 8-22 mins       Verdene Lennert, PT, DPT  Acute Rehabilitation Ortho Tech Supervisor [phone] pager [phone]) [phone] office

## 2021-02-16 NOTE — Plan of Care (Signed)
  Problem: Activity: Goal: Ability to avoid complications of mobility impairment will improve Outcome: Progressing   Problem: Coping: Goal: Level of anxiety will decrease Outcome: Progressing   Problem: Pain Management: Goal: Pain level will decrease Outcome: Progressing   Problem: Skin Integrity: Goal: Signs of wound healing will improve Outcome: Progressing

## 2021-02-16 NOTE — Plan of Care (Signed)
  Problem: Activity: Goal: Ability to avoid complications of mobility impairment will improve Outcome: Progressing   Problem: Pain Management: Goal: Pain level will decrease Outcome: Progressing   Problem: Skin Integrity: Goal: Signs of wound healing will improve Outcome: Progressing

## 2021-02-17 ENCOUNTER — Ambulatory Visit (INDEPENDENT_AMBULATORY_CARE_PROVIDER_SITE_OTHER): Payer: Medicare Other | Admitting: Otolaryngology

## 2021-02-17 ENCOUNTER — Encounter (HOSPITAL_COMMUNITY): Payer: Self-pay | Admitting: Orthopedic Surgery

## 2021-02-17 LAB — CBC
HCT: 35.8 % — ABNORMAL LOW (ref 39.0–52.0)
Hemoglobin: 12 g/dL — ABNORMAL LOW (ref 13.0–17.0)
MCH: 28.9 pg (ref 26.0–34.0)
MCHC: 33.5 g/dL (ref 30.0–36.0)
MCV: 86.3 fL (ref 80.0–100.0)
Platelets: 226 10*3/uL (ref 150–400)
RBC: 4.15 MIL/uL — ABNORMAL LOW (ref 4.22–5.81)
RDW: 14.2 % (ref 11.5–15.5)
WBC: 9.7 10*3/uL (ref 4.0–10.5)
nRBC: 0 % (ref 0.0–0.2)

## 2021-02-17 LAB — BASIC METABOLIC PANEL
Anion gap: 9 (ref 5–15)
BUN: 15 mg/dL (ref 6–20)
CO2: 29 mmol/L (ref 22–32)
Calcium: 8.6 mg/dL — ABNORMAL LOW (ref 8.9–10.3)
Chloride: 102 mmol/L (ref 98–111)
Creatinine, Ser: 0.7 mg/dL (ref 0.61–1.24)
GFR, Estimated: >60 mL/min (ref >60)
Glucose, Bld: 83 mg/dL (ref 70–99)
Potassium: 3.5 mmol/L (ref 3.5–5.1)
Sodium: 140 mmol/L (ref 135–145)

## 2021-02-17 MED ORDER — POLYETHYLENE GLYCOL 3350 17 G PO PACK
17.0000 g | PACK | Freq: Two times a day (BID) | ORAL | Status: DC
  Administered 2021-02-17 – 2021-02-19 (×4): 17 g via ORAL
  Filled 2021-02-17 (×7): qty 1

## 2021-02-17 MED ORDER — DIPHENHYDRAMINE HCL 25 MG PO CAPS
25.0000 mg | ORAL_CAPSULE | Freq: Four times a day (QID) | ORAL | Status: DC | PRN
  Administered 2021-02-19 (×2): 25 mg via ORAL
  Filled 2021-02-17 (×2): qty 1

## 2021-02-17 NOTE — NC FL2 (Signed)
[place] LEVEL OF CARE SCREENING TOOL     IDENTIFICATION  Patient Name: Roger Morton Birthdate: October 21, 1970 Sex: male Admission Date (Current Location): 02/14/2021  [employer] and [place] Number:  Herbalist and Address:  [facility] [address] [place], [place] [address]      Provider Number: [id number]  Attending Physician Name and Address:  Mariel Aloe, MD  Relative Name and Phone Number:  Alfonso, [place] (Brother)   [phone]    Current Level of Care: Hospital Recommended Level of Care: [place] Prior Approval Number:    Date Approved/Denied:   PASRR Number: [id number]  Discharge Plan: SNF    Current Diagnoses: Patient Active Problem List   Diagnosis Date Noted   Femur fracture ([place]) 02/15/2021   Leukocytosis 02/15/2021   Hyperlipidemia 02/15/2021   GERD (gastroesophageal reflux disease) 02/15/2021   Left-sided weakness 02/09/2021   Chronic pain 02/02/2021   Rash 02/02/2021   Encounter for general adult medical examination with abnormal findings 01/14/2021   Screening due 01/14/2021   Stroke ([place]) 01/14/2021   Seizure disorder ([place]) 01/14/2021   Chest pain 01/14/2021   Gait disorder 01/14/2021   Left knee pain 01/14/2021   Rhinorrhea 01/14/2021    Orientation RESPIRATION BLADDER Height & Weight     Self, Time, Situation, Place  Normal Continent Weight: 229 lb (103.9 kg) Height:  6\' 2"  (188 cm)  BEHAVIORAL SYMPTOMS/MOOD NEUROLOGICAL BOWEL NUTRITION STATUS      Continent Diet (see d/c summary)  AMBULATORY STATUS COMMUNICATION OF NEEDS Skin   Extensive Assist Verbally Surgical wounds                       Personal Care Assistance Level of Assistance  Bathing, Feeding, Dressing Bathing Assistance: Limited assistance Feeding assistance: Independent Dressing Assistance: Limited assistance     Functional Limitations Info  Sight, Hearing, Speech Sight Info:  Adequate Hearing Info: Adequate Speech Info: Adequate    SPECIAL CARE FACTORS FREQUENCY  PT (By licensed PT), OT (By licensed OT)     PT Frequency: 5x/ week OT Frequency: 5x/ week            Contractures Contractures Info: Not present    Additional Factors Info  Code Status, Allergies Code Status Info: Full Allergies Info: Penicillins           Current Medications (02/17/2021):  This is the current hospital active medication list Current Facility-Administered Medications  Medication Dose Route Frequency Provider Last Rate Last Admin   acetaminophen (TYLENOL) tablet 325-650 mg  325-650 mg Oral Q6H PRN Irving Copas, PA-C       aspirin chewable tablet 81 mg  81 mg Oral BID Irving Copas, PA-C   81 mg at 02/16/21 2123   atorvastatin (LIPITOR) tablet 40 mg  40 mg Oral QHS Irving Copas, PA-C   40 mg at 02/16/21 2123   baclofen (LIORESAL) tablet 10 mg  10 mg Oral TID Irving Copas, PA-C   10 mg at 02/16/21 2122   bisacodyl (DULCOLAX) suppository 10 mg  10 mg Rectal Daily PRN Irving Copas, PA-C       busPIRone (BUSPAR) tablet 5 mg  5 mg Oral BID Irving Copas, PA-C   5 mg at 02/16/21 2126   docusate sodium (COLACE) capsule 100 mg  100 mg Oral BID Irving Copas, PA-C   100 mg at 02/16/21 2122   gabapentin (NEURONTIN)  capsule 100 mg  100 mg Oral QHS Irving Copas, PA-C   100 mg at 02/16/21 2122   HYDROmorphone (DILAUDID) injection 0.5-1 mg  0.5-1 mg Intravenous Q4H PRN Irving Copas, PA-C   1 mg at 02/17/21 0809   levETIRAcetam (KEPPRA) tablet 750 mg  750 mg Oral BID Irving Copas, PA-C   750 mg at 02/16/21 2123   LORazepam (ATIVAN) injection 2 mg  2 mg Intravenous Q6H PRN Irving Copas, PA-C       melatonin tablet 3 mg  3 mg Oral QHS Mariel Aloe, MD   3 mg at 02/16/21 2122   menthol-cetylpyridinium (CEPACOL) lozenge 3 mg  1 lozenge Oral PRN Irving Copas, PA-C       Or   phenol (CHLORASEPTIC) mouth spray 1 spray  1 spray  Mouth/Throat PRN Irving Copas, PA-C       methocarbamol (ROBAXIN) tablet 500 mg  500 mg Oral Q6H PRN Costella Hatcher R, PA-C   500 mg at 02/17/21 0383   Or   methocarbamol (ROBAXIN) 500 mg in dextrose 5 % 50 mL IVPB  500 mg Intravenous Q6H PRN Irving Copas, PA-C       metoCLOPramide (REGLAN) tablet 5-10 mg  5-10 mg Oral Q8H PRN Irving Copas, PA-C       Or   metoCLOPramide (REGLAN) injection 5-10 mg  5-10 mg Intravenous Q8H PRN Irving Copas, PA-C       mupirocin ointment (BACTROBAN) 2 % 1 application  1 application Nasal BID Irving Copas, PA-C   1 application at 33/83/29 2124   naloxone [facility]) injection 0.4 mg  0.4 mg Intravenous PRN Irving Copas, PA-C       nicotine (NICODERM CQ - dosed in mg/24 hours) patch 14 mg  14 mg Transdermal Daily Irving Copas, PA-C   14 mg at 02/16/21 0844   ondansetron (ZOFRAN) tablet 4 mg  4 mg Oral Q6H PRN Irving Copas, PA-C       Or   ondansetron [facility]) injection 4 mg  4 mg Intravenous Q6H PRN Irving Copas, PA-C       oxyCODONE (Oxy IR/ROXICODONE) immediate release tablet 5-10 mg  5-10 mg Oral Q4H PRN Irving Copas, PA-C   10 mg at 02/16/21 1858   pantoprazole (PROTONIX) EC tablet 40 mg  40 mg Oral Daily Irving Copas, PA-C   40 mg at 02/16/21 1916   polyethylene glycol (MIRALAX / GLYCOLAX) packet 17 g  17 g Oral Daily PRN Irving Copas, PA-C       senna-docusate (Senokot-S) tablet 2 tablet  2 tablet Oral QHS Irving Copas, PA-C   2 tablet at 02/16/21 2122   tamsulosin (FLOMAX) capsule 0.4 mg  0.4 mg Oral QPC breakfast Irving Copas, PA-C   0.4 mg at 02/17/21 6060     Discharge Medications: Please see discharge summary for a list of discharge medications.  Relevant Imaging Results:  Relevant Lab Results:   Additional Information SSN:  [place], LCSWA

## 2021-02-17 NOTE — TOC Progression Note (Signed)
Transition of Care [facility]) - Progression Note    Patient Details  Name: Roger Morton MRN: [id number] Date of Birth: Jan 10, 1971  Transition of Care [facility]) CM/SW Contact  Milinda Antis, [place] Phone Number: 02/17/2021, 8:53 AM  Clinical Narrative:    CSW messaged TOC CMAs to request that insurance auth be initiated on this patient.          Expected Discharge Plan and Services                                                 Social Determinants of Health (SDOH) Interventions    Readmission Risk Interventions No flowsheet data found.

## 2021-02-17 NOTE — Progress Notes (Signed)
PROGRESS NOTE    Roger Morton  GMW:[id number] DOB: 19-Mar-1971 DOA: 02/14/2021 PCP: Noreene Larsson, NP   Brief Narrative: Roger Morton is a 50 y.o. male with a history CVA, seizure disorder, neuropathy, hypertension, BPH, GERD. Patient presented after a fall and resultant left hip fracture. Orthopedic surgery consulted.   Assessment & Plan:   Active Problems:   Seizure disorder (HCC)   Femur fracture (HCC)   Leukocytosis   Hyperlipidemia   GERD (gastroesophageal reflux disease)   Left proximal femur fracture Secondary to fall. Orthopedic surgery consulted. IM nail placement performed on 6/26. Partial weight bearing recommended. PT/OT consulted and OT recommend SNF. PT recommendations pending.  Leukocytosis Likely reactive. WBC of 13.7k. Mild. No evidence of infection at this time. Resolved.  Seizure disorder -Continue home Keppra  Hypokalemia Mild. Resolved.  Neuropathy Contractures -Continue gabapentin  Hyperlipidemia -Continue Lipitor  BPH -Continue tamsulosin  GERD -Continue Protonix  Constipation -Continue Senokot-S -Switch to Miralax BID, decrease to daily or prn once having more regular bowel movements  Tobacco use -Continue nicotine patch   DVT prophylaxis: SCDs, otherwise per surgery Code Status:   Code Status: Full Code Family Communication: None at bedside Disposition Plan: Discharge likely to SNF pending PT/OT recommendations and stable hemoglobin   Consultants:  Orthopedic surgery  Procedures:  LEFT INTRAMEDULLARY NAIL (02/15/2021)  Antimicrobials: None    Subjective: Feeling hot. No other concerns this morning.  Objective: Vitals:   02/16/21 0742 02/16/21 1600 02/16/21 2231 02/17/21 0738  BP: 119/63 122/90 114/70 129/83  Pulse: 89 94 90 96  Resp: 17 18 16 18   Temp: 98.2 F (36.8 C) 98.3 F (36.8 C) 98.9 F (37.2 C) 98.3 F (36.8 C)  TempSrc: Oral Oral Oral Oral  SpO2: 96% 94% 95% 93%  Weight:      Height:         Intake/Output Summary (Last 24 hours) at 02/17/2021 0931 Last data filed at 02/17/2021 0300 Gross per 24 hour  Intake --  Output 1000 ml  Net -1000 ml    Filed Weights   02/14/21 2112  Weight: 103.9 kg    Examination:  General exam: Appears calm and comfortable Respiratory system: Clear to auscultation. Respiratory effort normal. Cardiovascular system: S1 & S2 heard, RRR. No murmurs, rubs, gallops or clicks. Gastrointestinal system: Abdomen is distended, soft and nontender. No organomegaly or masses felt. Slightly increased bowel sounds heard. Central nervous system: Alert and oriented. No focal neurological deficits. Musculoskeletal: No edema. No calf tenderness Skin: No cyanosis. Psychiatry: Judgement and insight appear normal. Mood & affect appropriate.     Data Reviewed: I have personally reviewed following labs and imaging studies  CBC Lab Results  Component Value Date   WBC 9.7 02/17/2021   RBC 4.15 (L) 02/17/2021   HGB 12.0 (L) 02/17/2021   HCT 35.8 (L) 02/17/2021   MCV 86.3 02/17/2021   MCH 28.9 02/17/2021   PLT 226 02/17/2021   MCHC 33.5 02/17/2021   RDW 14.2 02/17/2021   LYMPHSABS 1.5 02/14/2021   MONOABS 0.9 02/14/2021   EOSABS 0.0 02/14/2021   BASOSABS 0.0 44/10/4740     Last metabolic panel Lab Results  Component Value Date   NA 140 02/17/2021   K 3.5 02/17/2021   CL 102 02/17/2021   CO2 29 02/17/2021   BUN 15 02/17/2021   CREATININE 0.70 02/17/2021   GLUCOSE 83 02/17/2021   GFRNONAA >60 02/17/2021   CALCIUM 8.6 (L) 02/17/2021   PHOS 2.9 02/15/2021  PROT 6.4 (L) 02/14/2021   ALBUMIN 3.6 02/14/2021   LABGLOB 2.7 01/29/2021   AGRATIO 1.7 01/29/2021   BILITOT 0.5 02/14/2021   ALKPHOS 70 02/14/2021   AST 18 02/14/2021   ALT 14 02/14/2021   ANIONGAP 9 02/17/2021    CBG (last 3)  No results for input(s): GLUCAP in the last 72 hours.   GFR: Estimated Creatinine Clearance: 140.5 mL/min (by C-G formula based on SCr of 0.7  mg/dL).  Coagulation Profile: No results for input(s): INR, PROTIME in the last 168 hours.  Recent Results (from the past 240 hour(s))  Resp Panel by RT-PCR (Flu A&B, Covid) Nasopharyngeal Swab     Status: None   Collection Time: 02/14/21 11:15 PM   Specimen: Nasopharyngeal Swab; Nasopharyngeal(NP) swabs in vial transport medium  Result Value Ref Range Status   SARS Coronavirus 2 by RT PCR NEGATIVE NEGATIVE Final    Comment: (NOTE) SARS-CoV-2 target nucleic acids are NOT DETECTED.  The SARS-CoV-2 RNA is generally detectable in upper respiratory specimens during the acute phase of infection. The lowest concentration of SARS-CoV-2 viral copies this assay can detect is 138 copies/mL. A negative result does not preclude SARS-Cov-2 infection and should not be used as the sole basis for treatment or other patient management decisions. A negative result may occur with  improper specimen collection/handling, submission of specimen other than nasopharyngeal swab, presence of viral mutation(s) within the areas targeted by this assay, and inadequate number of viral copies(<138 copies/mL). A negative result must be combined with clinical observations, patient history, and epidemiological information. The expected result is Negative.  Fact Sheet for Patients:  EntrepreneurPulse.com.au  Fact Sheet for Healthcare Providers:  IncredibleEmployment.be  This test is no t yet approved or cleared by the [place] FDA and  has been authorized for detection and/or diagnosis of SARS-CoV-2 by FDA under an Emergency Use Authorization (EUA). This EUA will remain  in effect (meaning this test can be used) for the duration of the COVID-19 declaration under Section 564(b)(1) of the Act, 21 U.S.C.section 360bbb-3(b)(1), unless the authorization is terminated  or revoked sooner.       Influenza A by PCR NEGATIVE NEGATIVE Final   Influenza B by PCR NEGATIVE NEGATIVE  Final    Comment: (NOTE) The Xpert Xpress SARS-CoV-2/FLU/RSV plus assay is intended as an aid in the diagnosis of influenza from Nasopharyngeal swab specimens and should not be used as a sole basis for treatment. Nasal washings and aspirates are unacceptable for Xpert Xpress SARS-CoV-2/FLU/RSV testing.  Fact Sheet for Patients: EntrepreneurPulse.com.au  Fact Sheet for Healthcare Providers: IncredibleEmployment.be  This test is not yet approved or cleared by the [place] FDA and has been authorized for detection and/or diagnosis of SARS-CoV-2 by FDA under an Emergency Use Authorization (EUA). This EUA will remain in effect (meaning this test can be used) for the duration of the COVID-19 declaration under Section 564(b)(1) of the Act, 21 U.S.C. section 360bbb-3(b)(1), unless the authorization is terminated or revoked.  Performed at [facility], [place] [address] [place], [place] [address]   Surgical PCR screen     Status: Abnormal   Collection Time: 02/15/21  4:34 AM   Specimen: Nasal Mucosa; Nasal Swab  Result Value Ref Range Status   MRSA, PCR POSITIVE (A) NEGATIVE Final    Comment: RESULT CALLED TO, READ BACK BY AND VERIFIED WITH: A,DAVY RN @1155  02/15/21 EB    Staphylococcus aureus POSITIVE (A) NEGATIVE Final    Comment: (NOTE) The Xpert SA  Assay (FDA approved for NASAL specimens in patients 72 years of age and older), is one component of a comprehensive surveillance program. It is not intended to diagnose infection nor to guide or monitor treatment. Performed at [facility], [place] [address] [place], [place] [address]         Radiology Studies: DG C-Arm 1-60 Min  Result Date: 02/15/2021 CLINICAL DATA:  Left hip fracture repair. EXAM: DG C-ARM 1-60 MIN FLUOROSCOPY TIME:  Fluoroscopy Time:  1 minutes 41 seconds Radiation Exposure Index (if provided by the fluoroscopic device): 31.89 mGy Number of Acquired Spot  Images: 2 COMPARISON:  February 14, 2021 FINDINGS: A gamma nail and intramedullary rod have been placed across the intertrochanteric fracture with improved alignment. A distal interlocking screw is noted. IMPRESSION: Left hip fracture repair as above. Electronically Signed   By: Dorise Bullion III M.D   On: 02/15/2021 13:06   DG HIP OPERATIVE UNILAT WITH PELVIS LEFT  Result Date: 02/15/2021 CLINICAL DATA:  Left hip fracture repair. EXAM: DG C-ARM 1-60 MIN FLUOROSCOPY TIME:  Fluoroscopy Time:  1 minutes 41 seconds Radiation Exposure Index (if provided by the fluoroscopic device): 31.89 mGy Number of Acquired Spot Images: 2 COMPARISON:  February 14, 2021 FINDINGS: A gamma nail and intramedullary rod have been placed across the intertrochanteric fracture with improved alignment. A distal interlocking screw is noted. IMPRESSION: Left hip fracture repair as above. Electronically Signed   By: Dorise Bullion III M.D   On: 02/15/2021 13:06        Scheduled Meds:  aspirin  81 mg Oral BID   atorvastatin  40 mg Oral QHS   baclofen  10 mg Oral TID   busPIRone  5 mg Oral BID   docusate sodium  100 mg Oral BID   gabapentin  100 mg Oral QHS   levETIRAcetam  750 mg Oral BID   melatonin  3 mg Oral QHS   mupirocin ointment  1 application Nasal BID   nicotine  14 mg Transdermal Daily   pantoprazole  40 mg Oral Daily   senna-docusate  2 tablet Oral QHS   tamsulosin  0.4 mg Oral QPC breakfast   Continuous Infusions:  methocarbamol (ROBAXIN) IV       LOS: 2 days     Cordelia Poche, MD [facility] 02/17/2021, 9:31 AM  If 7PM-7AM, please contact night-coverage [email or web address]

## 2021-02-17 NOTE — TOC Progression Note (Addendum)
Transition of Care [facility]) - Progression Note    Patient Details  Name: Roger Morton MRN: [id number] Date of Birth: [birth year]  [facility] CM/SW Contact  Milinda Antis, [place] Phone Number: 02/17/2021, 12:21 PM  Clinical Narrative:     10:39-  CSW spoke with the patient and presented bed offers.  The patient is willing to go to [facility].  12:19-CSW called [facility] and was transferred to admissions [employer].  There was no answer.  CSW left VM requesting a returned call.  16:45-  CSW received notification that the patient now wanted a facility in [place].  CSW confirmed this information with the patient and faxed the referral to SNF in [place].  Awaiting bed offers.        Expected Discharge Plan and Services                                                 Social Determinants of Health (SDOH) Interventions    Readmission Risk Interventions No flowsheet data found.

## 2021-02-18 ENCOUNTER — Inpatient Hospital Stay (HOSPITAL_COMMUNITY): Payer: Medicare Other

## 2021-02-18 ENCOUNTER — Encounter (HOSPITAL_COMMUNITY): Payer: Self-pay | Admitting: Internal Medicine

## 2021-02-18 DIAGNOSIS — K219 Gastro-esophageal reflux disease without esophagitis: Secondary | ICD-10-CM

## 2021-02-18 DIAGNOSIS — R0789 Other chest pain: Secondary | ICD-10-CM

## 2021-02-18 DIAGNOSIS — G40909 Epilepsy, unspecified, not intractable, without status epilepticus: Secondary | ICD-10-CM

## 2021-02-18 LAB — CBC WITH DIFFERENTIAL/PLATELET
Abs Immature Granulocytes: 0.05 10*3/uL (ref 0.00–0.07)
Basophils Absolute: 0 10*3/uL (ref 0.0–0.1)
Basophils Relative: 0 %
Eosinophils Absolute: 0 10*3/uL (ref 0.0–0.5)
Eosinophils Relative: 0 %
HCT: 37.4 % — ABNORMAL LOW (ref 39.0–52.0)
Hemoglobin: 12.7 g/dL — ABNORMAL LOW (ref 13.0–17.0)
Immature Granulocytes: 1 %
Lymphocytes Relative: 12 %
Lymphs Abs: 1.3 10*3/uL (ref 0.7–4.0)
MCH: 28.7 pg (ref 26.0–34.0)
MCHC: 34 g/dL (ref 30.0–36.0)
MCV: 84.4 fL (ref 80.0–100.0)
Monocytes Absolute: 0.9 10*3/uL (ref 0.1–1.0)
Monocytes Relative: 8 %
Neutro Abs: 8.6 10*3/uL — ABNORMAL HIGH (ref 1.7–7.7)
Neutrophils Relative %: 79 %
Platelets: 260 10*3/uL (ref 150–400)
RBC: 4.43 MIL/uL (ref 4.22–5.81)
RDW: 13.6 % (ref 11.5–15.5)
WBC: 10.9 10*3/uL — ABNORMAL HIGH (ref 4.0–10.5)
nRBC: 0 % (ref 0.0–0.2)

## 2021-02-18 LAB — COMPREHENSIVE METABOLIC PANEL
ALT: 15 U/L (ref 0–44)
AST: 22 U/L (ref 15–41)
Albumin: 3.2 g/dL — ABNORMAL LOW (ref 3.5–5.0)
Alkaline Phosphatase: 65 U/L (ref 38–126)
Anion gap: 8 (ref 5–15)
BUN: 9 mg/dL (ref 6–20)
CO2: 27 mmol/L (ref 22–32)
Calcium: 8.6 mg/dL — ABNORMAL LOW (ref 8.9–10.3)
Chloride: 103 mmol/L (ref 98–111)
Creatinine, Ser: 0.57 mg/dL — ABNORMAL LOW (ref 0.61–1.24)
GFR, Estimated: >60 mL/min (ref >60)
Glucose, Bld: 117 mg/dL — ABNORMAL HIGH (ref 70–99)
Potassium: 3.3 mmol/L — ABNORMAL LOW (ref 3.5–5.1)
Sodium: 138 mmol/L (ref 135–145)
Total Bilirubin: 0.6 mg/dL (ref 0.3–1.2)
Total Protein: 6.5 g/dL (ref 6.5–8.1)

## 2021-02-18 LAB — GLUCOSE, CAPILLARY: Glucose-Capillary: 156 mg/dL — ABNORMAL HIGH (ref 70–99)

## 2021-02-18 LAB — TROPONIN I (HIGH SENSITIVITY)
Troponin I (High Sensitivity): 3 ng/L (ref <18)
Troponin I (High Sensitivity): 3 ng/L (ref <18)

## 2021-02-18 LAB — D-DIMER, QUANTITATIVE: D-Dimer, Quant: 1.37 ug/mL-FEU — ABNORMAL HIGH (ref 0.00–0.50)

## 2021-02-18 LAB — MAGNESIUM: Magnesium: 1.9 mg/dL (ref 1.7–2.4)

## 2021-02-18 LAB — RESP PANEL BY RT-PCR (FLU A&B, COVID) ARPGX2
Influenza A by PCR: NEGATIVE
Influenza B by PCR: NEGATIVE
SARS Coronavirus 2 by RT PCR: NEGATIVE

## 2021-02-18 LAB — PHOSPHORUS: Phosphorus: 3.3 mg/dL (ref 2.5–4.6)

## 2021-02-18 MED ORDER — HEPARIN BOLUS VIA INFUSION
4500.0000 [IU] | Freq: Once | INTRAVENOUS | Status: DC
  Filled 2021-02-18: qty 4500

## 2021-02-18 MED ORDER — SENNOSIDES-DOCUSATE SODIUM 8.6-50 MG PO TABS: 2.0000 | ORAL_TABLET | Freq: Every day | ORAL | 0 refills | Status: DC

## 2021-02-18 MED ORDER — MENTHOL 3 MG MT LOZG: 1.0000 | LOZENGE | OROMUCOSAL | 12 refills | Status: DC | PRN

## 2021-02-18 MED ORDER — HEPARIN (PORCINE) 25000 UT/250ML-% IV SOLN
1450.0000 [IU]/h | INTRAVENOUS | Status: DC
  Administered 2021-02-18: 1450 [IU]/h via INTRAVENOUS
  Filled 2021-02-18: qty 250

## 2021-02-18 MED ORDER — ONDANSETRON HCL 4 MG PO TABS: 4.0000 mg | ORAL_TABLET | Freq: Four times a day (QID) | ORAL | 0 refills | Status: DC | PRN

## 2021-02-18 MED ORDER — LORAZEPAM 1 MG PO TABS: 1.0000 mg | ORAL_TABLET | Freq: Four times a day (QID) | ORAL | Status: DC | PRN

## 2021-02-18 MED ORDER — HEPARIN SODIUM (PORCINE) 5000 UNIT/ML IJ SOLN
5000.0000 [IU] | Freq: Three times a day (TID) | INTRAMUSCULAR | Status: DC
  Administered 2021-02-18 – 2021-02-20 (×5): 5000 [IU] via SUBCUTANEOUS
  Filled 2021-02-18 (×5): qty 1

## 2021-02-18 MED ORDER — MUPIROCIN 2 % EX OINT: 1.0000 "application " | TOPICAL_OINTMENT | Freq: Two times a day (BID) | CUTANEOUS | 0 refills | Status: DC

## 2021-02-18 MED ORDER — HEPARIN BOLUS VIA INFUSION
3000.0000 [IU] | Freq: Once | INTRAVENOUS | Status: AC
  Administered 2021-02-18: 3000 [IU] via INTRAVENOUS
  Filled 2021-02-18: qty 3000

## 2021-02-18 MED ORDER — METOPROLOL TARTRATE 5 MG/5ML IV SOLN
5.0000 mg | Freq: Once | INTRAVENOUS | Status: AC
  Administered 2021-02-18: 5 mg via INTRAVENOUS
  Filled 2021-02-18: qty 5

## 2021-02-18 MED ORDER — IOHEXOL 350 MG/ML SOLN
75.0000 mL | Freq: Once | INTRAVENOUS | Status: AC | PRN
  Administered 2021-02-18: 75 mL via INTRAVENOUS

## 2021-02-18 MED ORDER — NICOTINE 14 MG/24HR TD PT24: 14.0000 mg | MEDICATED_PATCH | Freq: Every day | TRANSDERMAL | 0 refills | Status: DC

## 2021-02-18 MED ORDER — NITROGLYCERIN 0.4 MG SL SUBL
0.4000 mg | SUBLINGUAL_TABLET | SUBLINGUAL | Status: DC | PRN
  Administered 2021-02-18 (×2): 0.4 mg via SUBLINGUAL
  Filled 2021-02-18: qty 1

## 2021-02-18 MED ORDER — HYDROXYZINE HCL 25 MG PO TABS
25.0000 mg | ORAL_TABLET | Freq: Three times a day (TID) | ORAL | Status: DC
  Administered 2021-02-18 – 2021-02-20 (×6): 25 mg via ORAL
  Filled 2021-02-18 (×6): qty 1

## 2021-02-18 MED ORDER — LORAZEPAM 2 MG/ML IJ SOLN: 1.0000 mg | Freq: Four times a day (QID) | INTRAMUSCULAR | Status: DC | PRN

## 2021-02-18 MED ORDER — SODIUM CHLORIDE 0.9 % IV BOLUS
500.0000 mL | Freq: Once | INTRAVENOUS | Status: AC
  Administered 2021-02-18: 500 mL via INTRAVENOUS

## 2021-02-18 MED ORDER — BISACODYL 10 MG RE SUPP: 10.0000 mg | Freq: Every day | RECTAL | 0 refills | Status: DC | PRN

## 2021-02-18 MED ORDER — POTASSIUM CHLORIDE CRYS ER 20 MEQ PO TBCR
40.0000 meq | EXTENDED_RELEASE_TABLET | Freq: Two times a day (BID) | ORAL | Status: AC
  Administered 2021-02-18 (×2): 40 meq via ORAL
  Filled 2021-02-18 (×2): qty 2

## 2021-02-18 NOTE — Progress Notes (Signed)
Report given to Poonam,RN at 6E30, all questions and concerns were fully answered.

## 2021-02-18 NOTE — Progress Notes (Signed)
PROGRESS NOTE    Roger Morton  CBU:[id number] DOB: [birth year] DOA: 02/14/2021 PCP: Noreene Larsson, NP   Brief Narrative:  The patient is a 50 year old overweight Caucasian male with a past medical history significant for but not limited to CVA with contractures, seizure disorder, neuropathy, hypertension, BPH, GERD as well as other comorbidities who presented to the hospital after a fall.  He had a resultant left hip fracture and orthopedic surgery was consulted.  He underwent IM nail placement on 02/15/2021.  Ortho recommended partial weightbearing as tolerated and PT OT consulted and recommending SNF.  Patient is currently medically stable to be discharged to SNF and will need to follow-up with PCP as well as orthopedic surgery in outpatient setting in 1 to 2 weeks.   The Patient was set to be discharged to SNF on 02/18/21 however prior to discharge she developed chest pain and shortness of breath and became.  Bedside evaluation was done and he showed a sinus tachycardic and he was dyspneic and had to be put on 4 L.  Patient was very anxious at this time and stat troponins and EKG and chest x-ray were ordered as well as a D-dimer.  Patient was given sublingual morphine IV metoprolol and a 500 MLS bolus.  This helped his heart rate but he continued to have chest pain.  Because of his recent surgery there is concern for acute PE so CT PE protocol was done with negative for PE.  He was empirically started on IV heparin.  Cardiology was consulted and echocardiogram was obtained.  His troponins are flat.  EKG was personally reviewed, sinus tachycardia.  Appreciate further cardiac evaluation for his chest pain and dyspnea.  Patient was transferred to cardiac telemetry to 6 E.  His discharge was held and Cardiology consulted. Cardiology evaluated and recommending getting ECHO and if was normal patient could be Discharged. ECHO finally done late afternoon 02/19/21 and showed Normal EF to 60-65%. Anticipating  D/C to SNF in AM.   Assessment & Plan:   Active Problems:   Seizure disorder (HCC)   Femur fracture (HCC)   Leukocytosis   Hyperlipidemia   GERD (gastroesophageal reflux disease)  Left Proximal Femur Fracture -In the setting of Fall -Orthopedic Surgery was consulted and IM Nail placement was performed on 02/15/21 -VTE Prophylaxis ASA 81 mg po BID x 4 Weeks -Ortho Recommendind PWB LLE 50% and recommending that the Dressings remain in place until follow up in 2 weeks in Clinic with Dr. Alvan Dame -PT/OT evaluated and recommending SNF with [employer] Recommendations for Wheelchair, Wheelchair Cushion and Hospital Bed -Pain Control with Acetaminophen 325-650 mg po q6hprn Mild Pain, Oxycodone IR 5-10 mg po q4hprn Moderate/Sever Pain, and IV Hydromorphone 0.5-1 mg IV q4hprn Severe Pain -C/w Robaxin 500 mg po/IV q6hprn   Atypical Chest Pain/Dyspnea and Sinus Tachycardia, improving  -Developed Chest Pain 7/10 in Severity and described it as a squeezing and became Dyspneic and Tachypenic yesterday afternoon and had to be placed on O2; Had another episode yesterday evening  -Troponin Negative -EKG showed Sinus Tachycardia -TSH check -CTA PE done and showed "No evidence of pulmonary embolism to the lobar level with more distal evaluation limited by suboptimal contrast timing. No acute traumatic findings in the chest. Numerous  scattered calcified nodules throughout both lungs, most pronounced anteriorly and in the upper lobes, as  well as small calcified lymph nodes towards the hiatus. Likely reflect sequela of prior granulomatous disease in the chest. Evidence of prior avascular necrosis of  bilateral humeral heads. Correlate with patient history. Associated degenerative changes in the shoulders. Centrilobular and paraseptal emphysema ( Emphysema (ICD10-J43.9).) Aortic Atherosclerosis (ICD10-I70.0).  Subcutaneous shunt catheter tubing noted at the base of the neck bilaterally and extending across the right  anterior chest wall." -Cardiology consulted and recommending checking ECHO -ECHO showed Normal EF and Normal Diastolic Fxn -Patient no longer on O2 and no longer having Chest Pain; He is stable to D/C; Social work to arrange transport to SNF in AM   Leukocytosis -Likely reactive. WBC of 13.7k initially. Mild. No evidence of infection at this time. Resolved yesterday but slightly worse yesterday at 10.9 and now 9.1 -Continue to monitor and Trend and Repeat CMP within 1 week  Lung Nodules -Noted on CT Scan and CXR -CT Scan showed "Numerous  scattered calcified nodules throughout both lungs, most pronounced anteriorly and in the upper lobes, as  well as small calcified lymph nodes towards the hiatus. Likely reflect sequela of prior granulomatous disease in the chest" -CXR showed "Minimal left lung base atelectasis/scarring. No focal consolidation, pleural effusion, or pneumothorax. There is a 12 mm nodule in the right mid lung field and a 5 mm right upper lung field nodule. Further evaluation with chest CT on a nonemergent/outpatient basis recommended. The cardiac silhouette  is within normal limits. No acute osseous pathology." -Outpatient follow up    Normocytic Anemia -Mild and Stable. -Hgb/Hct is now 12.3/36.5 -Check Anemia Panel in the outpatient setting -Continue to Monitor for S/Sx of Bleeding; No overt Bleeding Noted -Repeat CBC within 1 week   Seizure disorder -Continue home Levetiracetam 750 mg po BID -Patient has 2 mg of IV Lorazepam q6hprn for Seizures   Hypokalemia -Mild with a K+ of 3.3 and improved to 4.0 -Replete with po Kcl 40 mEQ BID x2 yesterday  -Continue to Monitor and Replete as Necessary -Repeat CMP within 1 week   Neuropathy Hx of CVA and Contractures -Had a Brain tumor and had CVAs during his operation -Continue Gabapentin 100 mg po qHS and Baclofen 10 mg TID   Hyperlipidemia -Continue Atorvastatin 40 mg po qHS   BPH -Continue Tamsulosin 0.4 mg po  Daily   GERD -Continue Pantoprazole 40 mg po Daily   Constipation -Continue Senokot-S 2 tab po qHS -Switch to Miralax BID, decrease to daily or prn once having more regular bowel movements and c/w Dousate 100 mg po BID -Given a Bisacodyl 10 mg RC Moderate Constipation   Tobacco use -Continue nicotine patch 14 mg TD -Smoking Cessation Counseling given   DVT prophylaxis: Heparin 5,000 units sq q8h; ASA 81 mg po BID Code Status: FULL CODE  Family Communication: No family present at bedside  Disposition Plan: SNF in the next 24 hours   Status is: Inpatient  Remains inpatient appropriate because:Unsafe d/c plan, IV treatments appropriate due to intensity of illness or inability to take PO, and Inpatient level of care appropriate due to severity of illness  Dispo:  Patient From: Home  Planned Disposition: [facility]  Medically stable for discharge: No     Consultants:  Orthopedic Surgery Cardiology  Procedures:  ECHOCARDIOGRAM IMPRESSIONS     1. Left ventricular ejection fraction, by estimation, is 60 to 65%. The  left ventricle has normal function. The left ventricle has no regional  wall motion abnormalities. There is mild concentric left ventricular  hypertrophy. Left ventricular diastolic  parameters were normal.   2. Right ventricular systolic function is normal. The right ventricular  size is normal. Tricuspid  regurgitation signal is inadequate for assessing  PA pressure.   3. The mitral valve is grossly normal. No evidence of mitral valve  regurgitation. No evidence of mitral stenosis.   4. The aortic valve is tricuspid. Aortic valve regurgitation is not  visualized. No aortic stenosis is present.   5. The inferior vena cava is normal in size with greater than 50%  respiratory variability, suggesting right atrial pressure of 3 mmHg.   Conclusion(s)/Recommendation(s): Normal biventricular function without  evidence of hemodynamically significant  valvular heart disease.   FINDINGS   Left Ventricle: Left ventricular ejection fraction, by estimation, is 60  to 65%. The left ventricle has normal function. The left ventricle has no  regional wall motion abnormalities. Definity contrast agent was given IV  to delineate the left ventricular   endocardial borders. The left ventricular internal cavity size was normal  in size. There is mild concentric left ventricular hypertrophy. Left  ventricular diastolic parameters were normal.   Right Ventricle: The right ventricular size is normal. No increase in  right ventricular wall thickness. Right ventricular systolic function is  normal. Tricuspid regurgitation signal is inadequate for assessing PA  pressure.   Left Atrium: Left atrial size was normal in size.   Right Atrium: Right atrial size was normal in size.   Pericardium: Trivial pericardial effusion is present.   Mitral Valve: The mitral valve is grossly normal. No evidence of mitral  valve regurgitation. No evidence of mitral valve stenosis. MV peak  gradient, 3.9 mmHg. The mean mitral valve gradient is 2.0 mmHg.   Tricuspid Valve: The tricuspid valve is grossly normal. Tricuspid valve  regurgitation is trivial. No evidence of tricuspid stenosis.   Aortic Valve: The aortic valve is tricuspid. Aortic valve regurgitation is  not visualized. No aortic stenosis is present. Aortic valve mean gradient  measures 3.0 mmHg. Aortic valve peak gradient measures 4.9 mmHg. Aortic  valve area, by VTI measures 2.71  cm.   Pulmonic Valve: The pulmonic valve was grossly normal. Pulmonic valve  regurgitation is not visualized. No evidence of pulmonic stenosis.   Aorta: The aortic root and ascending aorta are structurally normal, with  no evidence of dilitation.   Venous: The inferior vena cava is normal in size with greater than 50%  respiratory variability, suggesting right atrial pressure of 3 mmHg.   IAS/Shunts: The atrial septum is  grossly normal.      LEFT VENTRICLE  PLAX 2D  LVIDd:         4.20 cm  Diastology  LVIDs:         2.70 cm  LV e' medial:    7.62 cm/s  LV PW:         1.30 cm  LV E/e' medial:  8.5  LV IVS:        1.30 cm  LV e' lateral:   9.79 cm/s  LVOT diam:     1.90 cm  LV E/e' lateral: 6.6  LV SV:         50  LV SV Index:   22  LVOT Area:     2.84 cm      RIGHT VENTRICLE             IVC  RV Basal diam:  2.90 cm     IVC diam: 1.90 cm  RV S prime:     19.40 cm/s  TAPSE (M-mode): 2.0 cm   LEFT ATRIUM  Index       RIGHT ATRIUM           Index  LA diam:      2.00 cm 0.87 cm/m  RA Area:     16.30 cm  LA Vol (A2C): 43.2 ml 18.76 ml/m RA Volume:   41.80 ml  18.16 ml/m  LA Vol (A4C): 24.7 ml 10.73 ml/m   AORTIC VALVE  AV Area (Vmax):    3.01 cm  AV Area (Vmean):   2.95 cm  AV Area (VTI):     2.71 cm  AV Vmax:           111.00 cm/s  AV Vmean:          74.100 cm/s  AV VTI:            0.183 m  AV Peak Grad:      4.9 mmHg  AV Mean Grad:      3.0 mmHg  LVOT Vmax:         118.00 cm/s  LVOT Vmean:        77.000 cm/s  LVOT VTI:          0.175 m  LVOT/AV VTI ratio: 0.96     AORTA  Ao Root diam: 3.58 cm  Ao Asc diam:  3.70 cm   MITRAL VALVE  MV Area (PHT): 2.97 cm    SHUNTS  MV Area VTI:   2.29 cm    Systemic VTI:  0.18 m  MV Peak grad:  3.9 mmHg    Systemic Diam: 1.90 cm  MV Mean grad:  2.0 mmHg  MV Vmax:       0.99 m/s  MV Vmean:      67.3 cm/s  MV Decel Time: 255 msec  MV E velocity: 65.00 cm/s  MV A velocity: 78.10 cm/s  MV E/A ratio:  0.83   Antimicrobials:  Anti-infectives (From admission, onward)    Start     Dose/Rate Route Frequency Ordered Stop   02/15/21 1800  ceFAZolin (ANCEF) IVPB 2g/100 mL premix        2 g 200 mL/hr over 30 Minutes Intravenous Every 6 hours 02/15/21 1211 02/16/21 0042   02/15/21 0900  ceFAZolin (ANCEF) IVPB 2g/100 mL premix        2 g 200 mL/hr over 30 Minutes Intravenous On call to O.R. 02/15/21 0340 02/15/21 0945         Subjective: Seen and examined and had no more chest pain but has since her last night.  No lightheadedness or dizziness.  Feels better and has no shortness of breath.  His main complaint was that he is feeling hot.  No other concerns or complaints at this time.  Objective: Vitals:   02/17/21 0738 02/17/21 1512 02/17/21 2006 02/18/21 0815  BP: 129/83 122/86 133/73 (!) 148/101  Pulse: 96 99 98 89  Resp: 18 17 17 17   Temp: 98.3 F (36.8 C) 98.3 F (36.8 C) 98.7 F (37.1 C) 98 F (36.7 C)  TempSrc: Oral Oral Oral Oral  SpO2: 93% 95% 92% 96%  Weight:      Height:        Intake/Output Summary (Last 24 hours) at 02/18/2021 5102 Last data filed at 02/17/2021 1300 Gross per 24 hour  Intake 600 ml  Output 450 ml  Net 150 ml   Filed Weights   02/14/21 2112  Weight: 103.9 kg   Examination: Physical Exam:  Constitutional: WN/WD overweight Caucasian male in NAD and  appears calm and comfortable Eyes: Lids and conjunctivae normal, sclerae anicteric  ENMT: External Ears, Nose appear normal. Grossly normal hearing.  Neck: Appears normal, supple, no cervical masses, normal ROM, no appreciable thyromegaly; no JVD Respiratory: Diminished to auscultation bilaterally, no wheezing, rales, rhonchi or crackles. Normal respiratory effort and patient is not tachypenic. No accessory muscle use. Unlabored breathing  Cardiovascular: RRR, no murmurs / rubs / gallops. S1 and S2 auscultated. No appreciable edema  Abdomen: Soft, non-tender, Distended 2/2 to body habitus. Bowel sounds positive.  GU: Deferred. Musculoskeletal: No clubbing / cyanosis of digits/nails. No joint deformity upper and lower extremities.  Skin: No rashes, lesions, ulcers on a limited skin evaluation. No induration; Warm and dry.  Neurologic: Has some contractures and weakness from his prior stroke as well as face droop Psychiatric: Normal judgment and insight. Alert and oriented x 3. Normal mood and appropriate affect.   Data  Reviewed: I have personally reviewed following labs and imaging studies  CBC: Recent Labs  Lab 02/14/21 2314 02/15/21 0501 02/16/21 0145 02/17/21 0508  WBC 13.7* 11.3* 13.3* 9.7  NEUTROABS 11.2*  --   --   --   HGB 14.0 13.8 12.7* 12.0*  HCT 41.5 41.7 37.6* 35.8*  MCV 85.7 85.8 85.5 86.3  PLT 253 263 252 818   Basic Metabolic Panel: Recent Labs  Lab 02/14/21 2314 02/15/21 0501 02/16/21 0145 02/17/21 0307  NA 141 138 138 140  K 3.3* 3.5 3.7 3.5  CL 104 104 101 102  CO2 28 24 27 29   GLUCOSE 106* 140* 122* 83  BUN 14 15 11 15   CREATININE 0.75 0.64 0.69 0.70  CALCIUM 9.0 9.0 8.9 8.6*  MG  --  1.7  --   --   PHOS  --  2.9  --   --    GFR: Estimated Creatinine Clearance: 140.5 mL/min (by C-G formula based on SCr of 0.7 mg/dL). Liver Function Tests: Recent Labs  Lab 02/14/21 2314  AST 18  ALT 14  ALKPHOS 70  BILITOT 0.5  PROT 6.4*  ALBUMIN 3.6   No results for input(s): LIPASE, AMYLASE in the last 168 hours. No results for input(s): AMMONIA in the last 168 hours. Coagulation Profile: No results for input(s): INR, PROTIME in the last 168 hours. Cardiac Enzymes: No results for input(s): CKTOTAL, CKMB, CKMBINDEX, TROPONINI in the last 168 hours. BNP (last 3 results) No results for input(s): PROBNP in the last 8760 hours. HbA1C: No results for input(s): HGBA1C in the last 72 hours. CBG: No results for input(s): GLUCAP in the last 168 hours. Lipid Profile: No results for input(s): CHOL, HDL, LDLCALC, TRIG, CHOLHDL, LDLDIRECT in the last 72 hours. Thyroid Function Tests: No results for input(s): TSH, T4TOTAL, FREET4, T3FREE, THYROIDAB in the last 72 hours. Anemia Panel: No results for input(s): VITAMINB12, FOLATE, FERRITIN, TIBC, IRON, RETICCTPCT in the last 72 hours. Sepsis Labs: No results for input(s): PROCALCITON, LATICACIDVEN in the last 168 hours.  Recent Results (from the past 240 hour(s))  Resp Panel by RT-PCR (Flu A&B, Covid) Nasopharyngeal Swab      Status: None   Collection Time: 02/14/21 11:15 PM   Specimen: Nasopharyngeal Swab; Nasopharyngeal(NP) swabs in vial transport medium  Result Value Ref Range Status   SARS Coronavirus 2 by RT PCR NEGATIVE NEGATIVE Final    Comment: (NOTE) SARS-CoV-2 target nucleic acids are NOT DETECTED.  The SARS-CoV-2 RNA is generally detectable in upper respiratory specimens during the acute phase of infection. The lowest concentration of  SARS-CoV-2 viral copies this assay can detect is 138 copies/mL. A negative result does not preclude SARS-Cov-2 infection and should not be used as the sole basis for treatment or other patient management decisions. A negative result may occur with  improper specimen collection/handling, submission of specimen other than nasopharyngeal swab, presence of viral mutation(s) within the areas targeted by this assay, and inadequate number of viral copies(<138 copies/mL). A negative result must be combined with clinical observations, patient history, and epidemiological information. The expected result is Negative.  Fact Sheet for Patients:  EntrepreneurPulse.com.au  Fact Sheet for Healthcare Providers:  IncredibleEmployment.be  This test is no t yet approved or cleared by the [place] FDA and  has been authorized for detection and/or diagnosis of SARS-CoV-2 by FDA under an Emergency Use Authorization (EUA). This EUA will remain  in effect (meaning this test can be used) for the duration of the COVID-19 declaration under Section 564(b)(1) of the Act, 21 U.S.C.section 360bbb-3(b)(1), unless the authorization is terminated  or revoked sooner.       Influenza A by PCR NEGATIVE NEGATIVE Final   Influenza B by PCR NEGATIVE NEGATIVE Final    Comment: (NOTE) The Xpert Xpress SARS-CoV-2/FLU/RSV plus assay is intended as an aid in the diagnosis of influenza from Nasopharyngeal swab specimens and should not be used as a sole basis  for treatment. Nasal washings and aspirates are unacceptable for Xpert Xpress SARS-CoV-2/FLU/RSV testing.  Fact Sheet for Patients: EntrepreneurPulse.com.au  Fact Sheet for Healthcare Providers: IncredibleEmployment.be  This test is not yet approved or cleared by the [place] FDA and has been authorized for detection and/or diagnosis of SARS-CoV-2 by FDA under an Emergency Use Authorization (EUA). This EUA will remain in effect (meaning this test can be used) for the duration of the COVID-19 declaration under Section 564(b)(1) of the Act, 21 U.S.C. section 360bbb-3(b)(1), unless the authorization is terminated or revoked.  Performed at [facility], [place] [address] [place], [place] [address]   Surgical PCR screen     Status: Abnormal   Collection Time: 02/15/21  4:34 AM   Specimen: Nasal Mucosa; Nasal Swab  Result Value Ref Range Status   MRSA, PCR POSITIVE (A) NEGATIVE Final    Comment: RESULT CALLED TO, READ BACK BY AND VERIFIED WITH: A,DAVY RN @1155  02/15/21 EB    Staphylococcus aureus POSITIVE (A) NEGATIVE Final    Comment: (NOTE) The Xpert SA Assay (FDA approved for NASAL specimens in patients 15 years of age and older), is one component of a comprehensive surveillance program. It is not intended to diagnose infection nor to guide or monitor treatment. Performed at [facility], [place] [address], [place], [place] [address]      RN Pressure Injury Documentation:     Estimated body mass index is 29.4 kg/m as calculated from the following:   Height as of this encounter: 6\' 2"  (1.88 m).   Weight as of this encounter: 103.9 kg.  Malnutrition Type:  Malnutrition Characteristics:   Nutrition Interventions:     Radiology Studies: No results found.  Scheduled Meds:  aspirin  81 mg Oral BID   atorvastatin  40 mg Oral QHS   baclofen  10 mg Oral TID   busPIRone  5 mg Oral BID   docusate sodium  100 mg Oral BID    gabapentin  100 mg Oral QHS   levETIRAcetam  750 mg Oral BID   melatonin  3 mg Oral QHS   mupirocin ointment  1 application Nasal BID   nicotine  14 mg Transdermal Daily   pantoprazole  40 mg Oral Daily   polyethylene glycol  17 g Oral BID   senna-docusate  2 tablet Oral QHS   tamsulosin  0.4 mg Oral QPC breakfast   Continuous Infusions:  methocarbamol (ROBAXIN) IV      LOS: 3 days   Kerney Elbe, DO [facility] PAGER is on AMION  If 7PM-7AM, please contact night-coverage [email or web address]

## 2021-02-18 NOTE — Discharge Summary (Signed)
Physician Discharge Summary  Roger Morton PPI:[id number] DOB: 05/03/1971 DOA: 02/14/2021  PCP: Noreene Larsson, NP  Admit date: 02/14/2021 Discharge date: 02/18/2021  Admitted From: Home Disposition: SNF  Recommendations for Outpatient Follow-up:  Follow up with PCP in 1-2 weeks Follow up with Orthopedic Surgery Please obtain BMP/CBC in one week Please follow up on the following pending results:  Home Health: No Equipment/Devices: None  Discharge Condition: Stable CODE STATUS: FULL CODE  Diet recommendation:   Brief/Interim Summary: The patient is a 50 year old overweight Caucasian male with a past medical history significant for but not limited to CVA with contractures, seizure disorder, neuropathy, hypertension, BPH, GERD as well as other comorbidities who presented to the hospital after a fall.  He had a resultant left hip fracture and orthopedic surgery was consulted.  He underwent IM nail placement on 02/15/2021.  Ortho recommended partial weightbearing as tolerated and PT OT consulted and recommending SNF.  Patient is currently medically stable to be discharged to SNF and will need to follow-up with PCP as well as orthopedic surgery in outpatient setting in 1 to 2 weeks.  Discharge Diagnoses:  Active Problems:   Seizure disorder (HCC)   Femur fracture (HCC)   Leukocytosis   Hyperlipidemia   GERD (gastroesophageal reflux disease)  Left Proximal Femur Fracture -In the setting of Fall -Orthopedic Surgery was consulted and IM Nail placement was performed on 02/15/21 -VTE Prophylaxis ASA 81 mg po BID x 4 Weeks -Ortho Recommendind PWB LLE 50% and recommending that the Dressings remain in place until follow up in 2 weeks in Clinic with Dr. Alvan Dame -PT/OT evaluated and recommending SNF with [employer] Recommendations for Wheelchair, Wheelchair Cushion and Hospital Bed -Pain Control with Acetaminophen 325-650 mg po q6hprn Mild Pain, Oxycodone IR 5-10 mg po q4hprn Moderate/Sever Pain,  and IV Hydromorphone 0.5-1 mg IV q4hprn Severe Pain -C/w Robaxin 500 mg po/IV q6hprn   Leukocytosis -Likely reactive. WBC of 13.7k initially. Mild. No evidence of infection at this time. Resolved yesterday but slightly worse today at 10.9 -Continue to monitor and Trend and Repeat CMP within 1 week   Normocytic Anemia -Mild and Stable. -Hgb/Hct is now 12.7/37.4 -Check Anemia Panel in the outpatient setting -Continue to Monitor for S/Sx of Bleeding; No overt Bleeding Noted -Repeat CBC within 1 week    Seizure disorder -Continue home Levetiracetam 750 mg po BID  -Patient has 2 mg of IV Lorazepam q6hprn for Seizures    Hypokalemia -Mild with a K+ of 3.3 -Replete with po Kcl 40 mEQ BID x2 -Continue to Monitor and Replete as Necessary -Repeat CMP within 1 week    Neuropathy Hx of CVA and Contractures -Had a Brain tumor and had CVAs during his operation -Continue Gabapentin 100 mg po qHS and Baclofen 10 mg TID   Hyperlipidemia -Continue Atorvastatin 40 mg po qHS    BPH -Continue Tamsulosin 0.4 mg po Daily    GERD -Continue Pantoprazole 40 mg po Daily    Constipation -Continue Senokot-S 2 tab po qHS -Switch to Miralax BID, decrease to daily or prn once having more regular bowel movements and c/w Dousate 100 mg po BID  -Given a Bisacodyl 10 mg RC Moderate Constipation    Tobacco use -Continue nicotine patch 14 mg TD  -Smoking Cessation Counseling given   Discharge Instructions  Discharge Instructions     Call MD for:  difficulty breathing, headache or visual disturbances   Complete by: As directed    Call MD for:  extreme fatigue  Complete by: As directed    Call MD for:  hives   Complete by: As directed    Call MD for:  persistant dizziness or light-headedness   Complete by: As directed    Call MD for:  persistant nausea and vomiting   Complete by: As directed    Call MD for:  redness, tenderness, or signs of infection (pain, swelling, redness, odor or  green/yellow discharge around incision site)   Complete by: As directed    Call MD for:  severe uncontrolled pain   Complete by: As directed    Call MD for:  temperature >100.4   Complete by: As directed    Diet - low sodium heart healthy   Complete by: As directed    Discharge instructions   Complete by: As directed    You were cared for by a hospitalist during your hospital stay. If you have any questions about your discharge medications or the care you received while you were in the hospital after you are discharged, you can call the unit and ask to speak with the hospitalist on call if the hospitalist that took care of you is not available. Once you are discharged, your primary care physician will handle any further medical issues. Please note that NO REFILLS for any discharge medications will be authorized once you are discharged, as it is imperative that you return to your primary care physician (or establish a relationship with a primary care physician if you do not have one) for your aftercare needs so that they can reassess your need for medications and monitor your lab values.  Follow up with PCP and Orthopedic Surgery within 1-2 weeks. Take all medications as prescribed. If symptoms change or worsen please return to the ED for evaluation   Discharge wound care:   Complete by: As directed    Per Ortho Reccs   Increase activity slowly   Complete by: As directed       Allergies as of 02/18/2021       Reactions   Penicillins         Medication List     TAKE these medications    acetaminophen 325 MG tablet Commonly known as: TYLENOL Take 650 mg by mouth daily.   aspirin 81 MG chewable tablet Chew 1 tablet (81 mg total) by mouth 2 (two) times daily for 28 days.   atorvastatin 40 MG tablet Commonly known as: LIPITOR Take 40 mg by mouth at bedtime.   baclofen 10 MG tablet Commonly known as: LIORESAL Take 1 tablet (10 mg total) by mouth 3 (three) times daily.    bisacodyl 10 MG suppository Commonly known as: DULCOLAX Place 1 suppository (10 mg total) rectally daily as needed for moderate constipation.   busPIRone 5 MG tablet Commonly known as: BUSPAR Take 1 tablet (5 mg total) by mouth 2 (two) times daily.   celecoxib 200 MG capsule Commonly known as: CELEBREX Take 1 capsule (200 mg total) by mouth daily.   cetirizine 10 MG tablet Commonly known as: ZYRTEC Take 1 tablet (10 mg total) by mouth daily.   CLEAR EYES FOR DRY EYES OP Place 1 drop into the left eye in the morning and at bedtime.   gabapentin 100 MG capsule Commonly known as: NEURONTIN Take 1 capsule (100 mg total) by mouth at bedtime.   hydrochlorothiazide 25 MG tablet Commonly known as: HYDRODIURIL Take 1 tablet (25 mg total) by mouth daily.   hydrOXYzine 25 MG tablet Commonly known  as: ATARAX/VISTARIL Take 1 tablet (25 mg total) by mouth 3 (three) times daily.   ketoconazole 2 % cream Commonly known as: NIZORAL Apply 1 application topically daily.   levETIRAcetam 750 MG tablet Commonly known as: KEPPRA Take 1 tablet (750 mg total) by mouth 2 (two) times daily.   menthol-cetylpyridinium 3 MG lozenge Commonly known as: CEPACOL Take 1 lozenge (3 mg total) by mouth as needed for sore throat.   methocarbamol 500 MG tablet Commonly known as: ROBAXIN Take 1 tablet (500 mg total) by mouth every 6 (six) hours as needed for muscle spasms.   mupirocin ointment 2 % Commonly known as: BACTROBAN Place 1 application into the nose 2 (two) times daily.   nicotine 14 mg/24hr patch Commonly known as: NICODERM CQ - dosed in mg/24 hours Place 1 patch (14 mg total) onto the skin daily. Start taking on: February 19, 2021   omeprazole 20 MG capsule Commonly known as: PRILOSEC Take 1 capsule (20 mg total) by mouth daily.   ondansetron 4 MG tablet Commonly known as: ZOFRAN Take 1 tablet (4 mg total) by mouth every 6 (six) hours as needed for nausea.   oxyCODONE 5 MG immediate  release tablet Commonly known as: Oxy IR/ROXICODONE Take 1-2 tablets (5-10 mg total) by mouth every 6 (six) hours as needed for severe pain.   polyethylene glycol 17 g packet Commonly known as: MIRALAX / GLYCOLAX Take 17 g by mouth daily as needed for mild constipation.   promethazine 12.5 MG tablet Commonly known as: PHENERGAN Take 1 tablet (12.5 mg total) by mouth every 8 (eight) hours as needed for nausea or vomiting.   senna-docusate 8.6-50 MG tablet Commonly known as: Senokot-S Take 2 tablets by mouth at bedtime.   tamsulosin 0.4 MG Caps capsule Commonly known as: FLOMAX Take 0.4 mg by mouth in the morning and at bedtime.   traZODone 150 MG tablet Commonly known as: DESYREL Take 1 tablet (150 mg total) by mouth at bedtime.   triamcinolone cream 0.1 % Commonly known as: KENALOG Apply 1 application topically 2 (two) times daily.       ASK your doctor about these medications    docusate sodium 100 MG capsule Commonly known as: COLACE Take 1 capsule (100 mg total) by mouth daily.               Discharge Care Instructions  (From admission, onward)           Start     Ordered   02/18/21 0000  Discharge wound care:       Comments: Per Ortho Reccs   02/18/21 1232            Follow-up Information     Paralee Cancel, MD. Schedule an appointment as soon as possible for a visit in 2 week(s).   Specialty: Orthopedic Surgery Contact information: [address] [place] [address] [phone]         Noreene Larsson, NP Follow up.   Specialty: Nurse Practitioner Why: Follow up within 1-2 weeks Contact information: [address]  [address] [place] [address] [phone]                Allergies  Allergen Reactions   Penicillins    Consultations: Orthopedic Surgery  Procedures/Studies: DG C-Arm 1-60 Min  Result Date: 02/15/2021 CLINICAL DATA:  Left hip fracture repair. EXAM: DG C-ARM 1-60 MIN  FLUOROSCOPY TIME:  Fluoroscopy Time:  1 minutes 41 seconds Radiation Exposure  Index (if provided by the fluoroscopic device): 31.89 mGy Number of Acquired Spot Images: 2 COMPARISON:  February 14, 2021 FINDINGS: A gamma nail and intramedullary rod have been placed across the intertrochanteric fracture with improved alignment. A distal interlocking screw is noted. IMPRESSION: Left hip fracture repair as above. Electronically Signed   By: Dorise Bullion III M.D   On: 02/15/2021 13:06   DG HIP OPERATIVE UNILAT WITH PELVIS LEFT  Result Date: 02/15/2021 CLINICAL DATA:  Left hip fracture repair. EXAM: DG C-ARM 1-60 MIN FLUOROSCOPY TIME:  Fluoroscopy Time:  1 minutes 41 seconds Radiation Exposure Index (if provided by the fluoroscopic device): 31.89 mGy Number of Acquired Spot Images: 2 COMPARISON:  February 14, 2021 FINDINGS: A gamma nail and intramedullary rod have been placed across the intertrochanteric fracture with improved alignment. A distal interlocking screw is noted. IMPRESSION: Left hip fracture repair as above. Electronically Signed   By: Dorise Bullion III M.D   On: 02/15/2021 13:06   DG Hip Unilat W or Wo Pelvis 2-3 Views Left  Result Date: 02/15/2021 CLINICAL DATA:  Status post fall. EXAM: DG HIP (WITH OR WITHOUT PELVIS) 2-3V LEFT COMPARISON:  None. FINDINGS: An acute fracture deformity is seen extending through the inter trochanteric region of the proximal left femur. There is no evidence of dislocation. Moderate severity degenerative changes are seen involving both hips in the form of joint space narrowing and acetabular sclerosis. IMPRESSION: Acute fracture of the proximal left femur. Electronically Signed   By: Virgina Norfolk M.D.   On: 02/15/2021 00:32   DG Knee AP/LAT W/[place] Left  Result Date: 02/10/2021 Formatting of this result is different from the original. X-rays left knee were obtained in clinic today and demonstrates no acute injuries.  Well-maintained joint spaces.  Flexion  contractures evident on the x-rays.  Calcified matrix in the distal femur.   Impression: Normal left knee x-ray.  Minimal degenerative changes.    Subjective: Seen and examined at bedside and states that he is sometimes have trouble with his bowels and asking for a bowel regimen.  No nausea or vomiting.  Pain was okay today.  Denies any lightheadedness or dizziness.  No other concerns or complaints at this time  Discharge Exam: Vitals:   02/17/21 2006 02/18/21 0815  BP: 133/73 (!) 148/101  Pulse: 98 89  Resp: 17 17  Temp: 98.7 F (37.1 C) 98 F (36.7 C)  SpO2: 92% 96%   Vitals:   02/17/21 0738 02/17/21 1512 02/17/21 2006 02/18/21 0815  BP: 129/83 122/86 133/73 (!) 148/101  Pulse: 96 99 98 89  Resp: 18 17 17 17   Temp: 98.3 F (36.8 C) 98.3 F (36.8 C) 98.7 F (37.1 C) 98 F (36.7 C)  TempSrc: Oral Oral Oral Oral  SpO2: 93% 95% 92% 96%  Weight:      Height:       General: Pt is alert, awake, not in acute distress Cardiovascular: RRR, S1/S2 +, no rubs, no gallops Respiratory: Slightly diminished bilaterally, no wheezing, no rhonchi; unlabored breathing and not wearing supplemental O2 via [place] Abdominal: Soft, NT, tender secondary body habitus, bowel sounds + Extremities: no edema, no cyanosis  The results of significant diagnostics from this hospitalization (including imaging, microbiology, ancillary and laboratory) are listed below for reference.    Microbiology: Recent Results (from the past 240 hour(s))  Resp Panel by RT-PCR (Flu A&B, Covid) Nasopharyngeal Swab     Status: None   Collection Time: 02/14/21 11:15 PM   Specimen: Nasopharyngeal  Swab; Nasopharyngeal(NP) swabs in vial transport medium  Result Value Ref Range Status   SARS Coronavirus 2 by RT PCR NEGATIVE NEGATIVE Final    Comment: (NOTE) SARS-CoV-2 target nucleic acids are NOT DETECTED.  The SARS-CoV-2 RNA is generally detectable in upper respiratory specimens during the acute phase of infection. The  lowest concentration of SARS-CoV-2 viral copies this assay can detect is 138 copies/mL. A negative result does not preclude SARS-Cov-2 infection and should not be used as the sole basis for treatment or other patient management decisions. A negative result may occur with  improper specimen collection/handling, submission of specimen other than nasopharyngeal swab, presence of viral mutation(s) within the areas targeted by this assay, and inadequate number of viral copies(<138 copies/mL). A negative result must be combined with clinical observations, patient history, and epidemiological information. The expected result is Negative.  Fact Sheet for Patients:  EntrepreneurPulse.com.au  Fact Sheet for Healthcare Providers:  IncredibleEmployment.be  This test is no t yet approved or cleared by the [place] FDA and  has been authorized for detection and/or diagnosis of SARS-CoV-2 by FDA under an Emergency Use Authorization (EUA). This EUA will remain  in effect (meaning this test can be used) for the duration of the COVID-19 declaration under Section 564(b)(1) of the Act, 21 U.S.C.section 360bbb-3(b)(1), unless the authorization is terminated  or revoked sooner.       Influenza A by PCR NEGATIVE NEGATIVE Final   Influenza B by PCR NEGATIVE NEGATIVE Final    Comment: (NOTE) The Xpert Xpress SARS-CoV-2/FLU/RSV plus assay is intended as an aid in the diagnosis of influenza from Nasopharyngeal swab specimens and should not be used as a sole basis for treatment. Nasal washings and aspirates are unacceptable for Xpert Xpress SARS-CoV-2/FLU/RSV testing.  Fact Sheet for Patients: EntrepreneurPulse.com.au  Fact Sheet for Healthcare Providers: IncredibleEmployment.be  This test is not yet approved or cleared by the [place] FDA and has been authorized for detection and/or diagnosis of SARS-CoV-2 by FDA under  an Emergency Use Authorization (EUA). This EUA will remain in effect (meaning this test can be used) for the duration of the COVID-19 declaration under Section 564(b)(1) of the Act, 21 U.S.C. section 360bbb-3(b)(1), unless the authorization is terminated or revoked.  Performed at [facility], [place] [address], [place], [place] [address]   Surgical PCR screen     Status: Abnormal   Collection Time: 02/15/21  4:34 AM   Specimen: Nasal Mucosa; Nasal Swab  Result Value Ref Range Status   MRSA, PCR POSITIVE (A) NEGATIVE Final    Comment: RESULT CALLED TO, READ BACK BY AND VERIFIED WITH: A,DAVY RN @1155  02/15/21 EB    Staphylococcus aureus POSITIVE (A) NEGATIVE Final    Comment: (NOTE) The Xpert SA Assay (FDA approved for NASAL specimens in patients 45 years of age and older), is one component of a comprehensive surveillance program. It is not intended to diagnose infection nor to guide or monitor treatment. Performed at [facility], [place] [address], [place], [place] [address]     Labs: BNP (last 3 results) No results for input(s): BNP in the last 8760 hours. Basic Metabolic Panel: Recent Labs  Lab 02/14/21 2314 02/15/21 0501 02/16/21 0145 02/17/21 0307 02/18/21 0908  NA 141 138 138 140 138  K 3.3* 3.5 3.7 3.5 3.3*  CL 104 104 101 102 103  CO2 28 24 27 29 27   GLUCOSE 106* 140* 122* 83 117*  BUN 14 15 11 15  9  CREATININE 0.75 0.64 0.69 0.70 0.57*  CALCIUM 9.0 9.0 8.9 8.6* 8.6*  MG  --  1.7  --   --  1.9  PHOS  --  2.9  --   --  3.3   Liver Function Tests: Recent Labs  Lab 02/14/21 2314 02/18/21 0908  AST 18 22  ALT 14 15  ALKPHOS 70 65  BILITOT 0.5 0.6  PROT 6.4* 6.5  ALBUMIN 3.6 3.2*   No results for input(s): LIPASE, AMYLASE in the last 168 hours. No results for input(s): AMMONIA in the last 168 hours. CBC: Recent Labs  Lab 02/14/21 2314 02/15/21 0501 02/16/21 0145 02/17/21 0508 02/18/21 0908  WBC 13.7* 11.3* 13.3* 9.7 10.9*  NEUTROABS  11.2*  --   --   --  8.6*  HGB 14.0 13.8 12.7* 12.0* 12.7*  HCT 41.5 41.7 37.6* 35.8* 37.4*  MCV 85.7 85.8 85.5 86.3 84.4  PLT 253 263 252 226 260   Cardiac Enzymes: No results for input(s): CKTOTAL, CKMB, CKMBINDEX, TROPONINI in the last 168 hours. BNP: Invalid input(s): POCBNP CBG: No results for input(s): GLUCAP in the last 168 hours. D-Dimer No results for input(s): DDIMER in the last 72 hours. Hgb A1c No results for input(s): HGBA1C in the last 72 hours. Lipid Profile No results for input(s): CHOL, HDL, LDLCALC, TRIG, CHOLHDL, LDLDIRECT in the last 72 hours. Thyroid function studies No results for input(s): TSH, T4TOTAL, T3FREE, THYROIDAB in the last 72 hours.  Invalid input(s): FREET3 Anemia work up No results for input(s): VITAMINB12, FOLATE, FERRITIN, TIBC, IRON, RETICCTPCT in the last 72 hours. Urinalysis No results found for: COLORURINE, APPEARANCEUR, [place], [place], GLUCOSEU, [place], [place], [place], PROTEINUR, UROBILINOGEN, NITRITE, LEUKOCYTESUR Sepsis Labs Invalid input(s): PROCALCITONIN,  WBC,  LACTICIDVEN Microbiology Recent Results (from the past 240 hour(s))  Resp Panel by RT-PCR (Flu A&B, Covid) Nasopharyngeal Swab     Status: None   Collection Time: 02/14/21 11:15 PM   Specimen: Nasopharyngeal Swab; Nasopharyngeal(NP) swabs in vial transport medium  Result Value Ref Range Status   SARS Coronavirus 2 by RT PCR NEGATIVE NEGATIVE Final    Comment: (NOTE) SARS-CoV-2 target nucleic acids are NOT DETECTED.  The SARS-CoV-2 RNA is generally detectable in upper respiratory specimens during the acute phase of infection. The lowest concentration of SARS-CoV-2 viral copies this assay can detect is 138 copies/mL. A negative result does not preclude SARS-Cov-2 infection and should not be used as the sole basis for treatment or other patient management decisions. A negative result may occur with  improper specimen collection/handling, submission of specimen  other than nasopharyngeal swab, presence of viral mutation(s) within the areas targeted by this assay, and inadequate number of viral copies(<138 copies/mL). A negative result must be combined with clinical observations, patient history, and epidemiological information. The expected result is Negative.  Fact Sheet for Patients:  EntrepreneurPulse.com.au  Fact Sheet for Healthcare Providers:  IncredibleEmployment.be  This test is no t yet approved or cleared by the [place] FDA and  has been authorized for detection and/or diagnosis of SARS-CoV-2 by FDA under an Emergency Use Authorization (EUA). This EUA will remain  in effect (meaning this test can be used) for the duration of the COVID-19 declaration under Section 564(b)(1) of the Act, 21 U.S.C.section 360bbb-3(b)(1), unless the authorization is terminated  or revoked sooner.       Influenza A by PCR NEGATIVE NEGATIVE Final   Influenza B by PCR NEGATIVE NEGATIVE Final    Comment: (NOTE) The Xpert Xpress SARS-CoV-2/FLU/RSV plus assay  is intended as an aid in the diagnosis of influenza from Nasopharyngeal swab specimens and should not be used as a sole basis for treatment. Nasal washings and aspirates are unacceptable for Xpert Xpress SARS-CoV-2/FLU/RSV testing.  Fact Sheet for Patients: EntrepreneurPulse.com.au  Fact Sheet for Healthcare Providers: IncredibleEmployment.be  This test is not yet approved or cleared by the [place] FDA and has been authorized for detection and/or diagnosis of SARS-CoV-2 by FDA under an Emergency Use Authorization (EUA). This EUA will remain in effect (meaning this test can be used) for the duration of the COVID-19 declaration under Section 564(b)(1) of the Act, 21 U.S.C. section 360bbb-3(b)(1), unless the authorization is terminated or revoked.  Performed at [facility], [place] [address] [place],  [place] [address]   Surgical PCR screen     Status: Abnormal   Collection Time: 02/15/21  4:34 AM   Specimen: Nasal Mucosa; Nasal Swab  Result Value Ref Range Status   MRSA, PCR POSITIVE (A) NEGATIVE Final    Comment: RESULT CALLED TO, READ BACK BY AND VERIFIED WITH: A,DAVY RN @1155  02/15/21 EB    Staphylococcus aureus POSITIVE (A) NEGATIVE Final    Comment: (NOTE) The Xpert SA Assay (FDA approved for NASAL specimens in patients 12 years of age and older), is one component of a comprehensive surveillance program. It is not intended to diagnose infection nor to guide or monitor treatment. Performed at [facility], [place] [address], [place], [place] [address]    Time coordinating discharge: 35 minutes  SIGNED:  Kerney Elbe, DO [facility] 02/18/2021, 12:34 PM Pager is on [place]  If 7PM-7AM, please contact night-coverage [email or web address]

## 2021-02-18 NOTE — Progress Notes (Signed)
   02/18/21 1504  Document  Progress note created (see row info) Yes

## 2021-02-18 NOTE — TOC CAGE-AID Note (Signed)
Transition of Care [facility]) - CAGE-AID Screening   Patient Details  Name: Roger Morton MRN: [id number] Date of Birth: [birth year]  Elvina Sidle, RN Trauma Response Nurse Phone Number: [phone] 02/18/2021, 12:41 PM    CAGE-AID Screening:    Have You Ever Felt You Ought to Cut Down on Your Drinking or Drug Use?: No Have People Annoyed You By Critizing Your Drinking Or Drug Use?: No Have You Felt Bad Or Guilty About Your Drinking Or Drug Use?: No Have You Ever Had a Drink or Used Drugs First Thing In The Morning to Steady Your Nerves or to Get Rid of a Hangover?: No CAGE-AID Score: 0  Substance Abuse Education Offered: No (Denies etoh/drug use)

## 2021-02-18 NOTE — Progress Notes (Signed)
   02/18/21 1504  Assess: MEWS Score  BP (!) 131/110  Pulse Rate (!) 122  Resp (!) 28  Level of Consciousness Alert  SpO2 99 %  O2 Device Nasal Cannula  Patient Activity (if Appropriate) In bed  O2 Flow Rate (L/min) 4 L/min  Assess: MEWS Score  MEWS Temp 0  MEWS Systolic 0  MEWS Pulse 2  MEWS RR 2  MEWS LOC 0  MEWS Score 4  MEWS Score Color Red  Assess: if the MEWS score is Yellow or Red  Were vital signs taken at a resting state? Yes  Focused Assessment No change from prior assessment  Early Detection of Sepsis Score *See Row Information* Low  MEWS guidelines implemented *See Row Information* Yes  Treat  MEWS Interventions Administered prn meds/treatments;Escalated (See documentation below)  Pain Type Acute pain  Pain Location Chest  Pain Orientation Left  Pain Frequency  (tight)  Pain Onset On-going  Pain Intervention(s) Medication (See eMAR)  Take Vital Signs  Increase Vital Sign Frequency  Red: Q 1hr X 4 then Q 4hr X 4, if remains red, continue Q 4hrs  Escalate  MEWS: Escalate Red: discuss with charge nurse/RN and provider, consider discussing with RRT  Notify: Charge Nurse/RN  Name of Charge Nurse/RN Notified Brittany,RN  Date Charge Nurse/RN Notified 02/18/21  Time Charge Nurse/RN Notified 1504  Notify: Provider  Provider Name/Title Raiford Noble  Date Provider Notified 02/18/21  Time Provider Notified 1435  Notification Type Page  Notification Reason Other (Comment) (pt c/o SOB/chest tightness)  Provider response At bedside;See new orders  Date of Provider Response 02/18/21  Time of Provider Response 1500  Notify: Rapid Response  Name of Rapid Response RN Notified Lisa,RN  Date Rapid Response Notified 02/18/21  Time Rapid Response Notified 1445  Document  Patient Outcome Other (Comment) (Pt remains alert/oriented in no acute distress, )  Progress note created (see row info) Yes  pt to be transferred to a cardiac tele floor as ordered, Pt remains  alert/oriented in no acute distress. Pt remains tachy on the monitor.

## 2021-02-18 NOTE — Care Management Important Message (Signed)
Important Message  Patient Details  Name: Roger Morton MRN: [id number] Date of Birth: 06-Dec-1970   Medicare Important Message Given:  Yes   Due to illness patient was not able to sign Sign copy left a bedside.  [name] 02/18/2021, 2:28 PM

## 2021-02-18 NOTE — Consult Note (Addendum)
Cardiology Consultation:   Patient ID: Roger Morton MRN: [id number]; DOB: Sep 14, 1970  Admit date: 02/14/2021 Date of Consult: 02/18/2021  PCP:  Noreene Larsson, NP   [facility]Care Providers Cardiologist: New to Dr. Denton Lank here to update MD or APP on Care Team, Refresh:1}     Patient Profile:   Roger Morton is a 50 y.o. male with a hx of brain tumor s/p surgery/intracranial shunt x2 with post-op stroke with contractures, seizure disorder, neuropathy, ?HTN (pt denies, on HCTZ), borderline HLD (on statin), BPH, GERD, chronic appearing baseline elevated HR who is being seen 02/18/2021 for the evaluation of chest pain at the request of Dr. Alfredia Ferguson.  History of Present Illness:   Mr. Knotek has no prior cardiac history. At baseline he reports he does not do much aside from mobilize within his own home. With this, he has chronic DOE which he states has not changed as outpatient lately. Per chart review it is not unusual for him to have a baseline HR in the 90s-low 100s. He was admitted 02/14/2021 with mechanical fall while walking with his walker on unlevel ground. He sustained a left proximal femur fracture and underwent IM nail placement 02/15/21. He was in process of awaiting discharge to SNF today. Telemetry was discontinued around 11am. Around 2:30pm while watching TV he developed SOB and left lower/middle chest tightness. He felt jittery and anxious. HR was reported to be in the 110s per nursing. EKG obtained at 15:13 showed sinus tach without acute changes. He was treated with 2 SL NTG and then 5mg  IV metoprolol with resolution of chest pain over about 30 minutes time. He had not ever felt anything like this before. CT angio showed no evidence of PE to the lobar level with more distal evaluation limited by suboptimal contrast timing, numerous scattered calcified nodules throughout both lungs, most pronounced anteriorly and in the upper lobes, as well as small calcified lymph nodes  towards the hiatus, prior avascular necrosis of bilateral humeral heads, + emphysema, + aortic atherosclerosis, subcutaneous shunt catheter tubing noted at the base of the neck bilaterally and extending across the right anterior chest wall. He currently feels well without acute complaint aside from hip discomfort. No edema, history of syncope, pleuritic chest pain, nausea.  Labs notable this admission for mild hypokalemia (being repleted), leukocytosis (felt reactive), mild normocytic anemia with last Hgb 12.7. Troponins neg x 2.   Past Medical History:  Diagnosis Date   BPH (benign prostatic hyperplasia)    Essential hypertension    GERD (gastroesophageal reflux disease)    Neuropathy    Seizure disorder ([place])    Stroke ([place])    had brain tumor, and had CVAs during operation for his brain tumor    Past Surgical History:  Procedure Laterality Date   BRAIN SURGERY  2013   brain tumor excision   INTRAMEDULLARY (IM) NAIL INTERTROCHANTERIC Left 02/15/2021   Procedure: INTRAMEDULLARY (IM) NAIL INTERTROCHANTRIC;  Surgeon: Paralee Cancel, MD;  Location: [facility];  Service: Orthopedics;  Laterality: Left;   shunt intracranial x2       Home Medications:  Prior to Admission medications   Medication Sig Start Date End Date Taking? Authorizing Provider  acetaminophen (TYLENOL) 325 MG tablet Take 650 mg by mouth daily.   Yes [provider]  atorvastatin (LIPITOR) 40 MG tablet Take 40 mg by mouth at bedtime.   Yes [provider]  baclofen (LIORESAL) 10 MG tablet Take 1 tablet (10 mg total) by mouth  3 (three) times daily. 02/04/21  Yes Noreene Larsson, NP  busPIRone (BUSPAR) 5 MG tablet Take 1 tablet (5 mg total) by mouth 2 (two) times daily. 01/15/21  Yes Noreene Larsson, NP  Carboxymethylcellul-Glycerin (CLEAR EYES FOR DRY EYES OP) Place 1 drop into the left eye in the morning and at bedtime.   Yes [provider]  celecoxib (CELEBREX) 200 MG capsule Take 1 capsule (200 mg  total) by mouth daily. 01/15/21  Yes Noreene Larsson, NP  cetirizine (ZYRTEC) 10 MG tablet Take 1 tablet (10 mg total) by mouth daily. 01/15/21  Yes Noreene Larsson, NP  docusate sodium (COLACE) 100 MG capsule Take 1 capsule (100 mg total) by mouth daily. Patient taking differently: Take 100 mg by mouth 2 (two) times daily. 01/15/21  Yes Noreene Larsson, NP  gabapentin (NEURONTIN) 100 MG capsule Take 1 capsule (100 mg total) by mouth at bedtime. 01/15/21  Yes Noreene Larsson, NP  hydrochlorothiazide (HYDRODIURIL) 25 MG tablet Take 1 tablet (25 mg total) by mouth daily. 01/15/21  Yes Noreene Larsson, NP  hydrOXYzine (ATARAX/VISTARIL) 25 MG tablet Take 1 tablet (25 mg total) by mouth 3 (three) times daily. 01/15/21  Yes Noreene Larsson, NP  levETIRAcetam (KEPPRA) 750 MG tablet Take 1 tablet (750 mg total) by mouth 2 (two) times daily. 01/15/21  Yes Noreene Larsson, NP  omeprazole (PRILOSEC) 20 MG capsule Take 1 capsule (20 mg total) by mouth daily. 02/04/21  Yes Noreene Larsson, NP  promethazine (PHENERGAN) 12.5 MG tablet Take 1 tablet (12.5 mg total) by mouth every 8 (eight) hours as needed for nausea or vomiting. 01/15/21  Yes Noreene Larsson, NP  tamsulosin (FLOMAX) 0.4 MG CAPS capsule Take 0.4 mg by mouth in the morning and at bedtime. 08/20/20  Yes [provider]  traZODone (DESYREL) 150 MG tablet Take 1 tablet (150 mg total) by mouth at bedtime. 01/15/21  Yes Noreene Larsson, NP  triamcinolone cream (KENALOG) 0.1 % Apply 1 application topically 2 (two) times daily. 02/09/21  Yes Noreene Larsson, NP  aspirin 81 MG chewable tablet Chew 1 tablet (81 mg total) by mouth 2 (two) times daily for 28 days. 02/16/21 03/16/21  Irving Copas, PA-C  bisacodyl (DULCOLAX) 10 MG suppository Place 1 suppository (10 mg total) rectally daily as needed for moderate constipation. 02/18/21   Raiford Noble Latif, DO  ketoconazole (NIZORAL) 2 % cream Apply 1 application topically daily. 02/09/21   Noreene Larsson, NP   menthol-cetylpyridinium (CEPACOL) 3 MG lozenge Take 1 lozenge (3 mg total) by mouth as needed for sore throat. 02/18/21   Raiford Noble Latif, DO  methocarbamol (ROBAXIN) 500 MG tablet Take 1 tablet (500 mg total) by mouth every 6 (six) hours as needed for muscle spasms. 02/16/21   Irving Copas, PA-C  mupirocin ointment (BACTROBAN) 2 % Place 1 application into the nose 2 (two) times daily. 02/18/21   Sheikh, Georgina Quint Latif, DO  nicotine (NICODERM CQ - DOSED IN MG/24 HOURS) 14 mg/24hr patch Place 1 patch (14 mg total) onto the skin daily. 02/19/21   Sheikh, Omair Latif, DO  ondansetron (ZOFRAN) 4 MG tablet Take 1 tablet (4 mg total) by mouth every 6 (six) hours as needed for nausea. 02/18/21   Raiford Noble Latif, DO  oxyCODONE (OXY IR/ROXICODONE) 5 MG immediate release tablet Take 1-2 tablets (5-10 mg total) by mouth every 6 (six) hours as needed for severe pain. 02/16/21   Costella Hatcher  R, PA-C  polyethylene glycol (MIRALAX / GLYCOLAX) 17 g packet Take 17 g by mouth daily as needed for mild constipation. 02/16/21   Irving Copas, PA-C  senna-docusate (SENOKOT-S) 8.6-50 MG tablet Take 2 tablets by mouth at bedtime. 02/18/21   Kerney Elbe, DO    Inpatient Medications: Scheduled Meds:  aspirin  81 mg Oral BID   atorvastatin  40 mg Oral QHS   baclofen  10 mg Oral TID   busPIRone  5 mg Oral BID   docusate sodium  100 mg Oral BID   gabapentin  100 mg Oral QHS   hydrOXYzine  25 mg Oral TID   levETIRAcetam  750 mg Oral BID   melatonin  3 mg Oral QHS   mupirocin ointment  1 application Nasal BID   nicotine  14 mg Transdermal Daily   pantoprazole  40 mg Oral Daily   polyethylene glycol  17 g Oral BID   potassium chloride  40 mEq Oral BID   senna-docusate  2 tablet Oral QHS   tamsulosin  0.4 mg Oral QPC breakfast   Continuous Infusions:  heparin 1,450 Units/hr (02/18/21 1642)   methocarbamol (ROBAXIN) IV     PRN Meds: acetaminophen, bisacodyl, diphenhydrAMINE, HYDROmorphone  (DILAUDID) injection, LORazepam **OR** LORazepam, menthol-cetylpyridinium **OR** phenol, methocarbamol **OR** methocarbamol (ROBAXIN) IV, metoCLOPramide **OR** metoCLOPramide (REGLAN) injection, naLOXone (NARCAN)  injection, nitroGLYCERIN, ondansetron **OR** ondansetron (ZOFRAN) IV, oxyCODONE  Allergies:    Allergies  Allergen Reactions   Penicillins     Social History:   Social History   Socioeconomic History   Marital status: Single    Spouse name: Not on file   Number of children: 3   Years of education: Not on file   Highest education level: Not on file  Occupational History   Occupation: Disabled  Tobacco Use   Smoking status: Every Day    Packs/day: 1.50    Years: 25.00    Pack years: 37.50    Types: Cigarettes   Smokeless tobacco: Never  Vaping Use   Vaping Use: Some days   Substances: Nicotine  Substance and Sexual Activity   Alcohol use: Not Currently   Drug use: Not Currently   Sexual activity: Not Currently  Other Topics Concern   Not on file  Social History Narrative   2 in [place], the other daughter may be in [place], [place]   Social Determinants of Health   Financial Resource Strain: Low Risk    Difficulty of Paying Living Expenses: Not hard at all  Food Insecurity: No Food Insecurity   Worried About Charity fundraiser in the Last Year: Never true   Arboriculturist in the Last Year: Never true  Transportation Needs: No Transportation Needs   Lack of Transportation (Medical): No   Lack of Transportation (Non-Medical): No  Physical Activity: Sufficiently Active   Days of Exercise per Week: 7 days   Minutes of Exercise per Session: 30 min  Stress: No Stress Concern Present   Feeling of Stress : Not at all  Social Connections: Socially Isolated   Frequency of Communication with Friends and Family: Three times a week   Frequency of Social Gatherings with Friends and Family: Three times a week   Attends Religious Services: Never   Active Member of  Clubs or Organizations: No   Attends Archivist Meetings: Never   Marital Status: Never married  Intimate Partner Violence: Not At Risk   Fear of Current or Ex-Partner:  No   Emotionally Abused: No   Physically Abused: No   Sexually Abused: No    Family History:   Family History  Problem Relation Age of Onset   CAD Neg Hx      ROS:  Please see the history of present illness.  All other ROS reviewed and negative.     Physical Exam/Data:   Vitals:   02/18/21 1516 02/18/21 1522 02/18/21 1531 02/18/21 1729  BP: 109/66 100/78 117/90 106/70  Pulse: (!) 125 (!) 101 96 85  Resp:    17  Temp: 99.2 F (37.3 C)   98.7 F (37.1 C)  TempSrc: Oral   Oral  SpO2: 96% 97% 95% 95%  Weight:      Height:        Intake/Output Summary (Last 24 hours) at 02/18/2021 1754 Last data filed at 02/18/2021 1753 Gross per 24 hour  Intake 46.66 ml  Output 2200 ml  Net -2153.34 ml   Last 3 Weights 02/14/2021 02/09/2021 02/04/2021  Weight (lbs) 229 lb 229 lb 229 lb  Weight (kg) 103.874 kg 103.874 kg 103.874 kg     Body mass index is 29.4 kg/m.  General: Well developed, well nourished WM in no acute distress. Head: Normocephalic, atraumatic, sclera non-icteric, no xanthomas, nares are without discharge. Neck: Negative for carotid bruits. JVP not elevated. Lungs: Clear bilaterally to auscultation without wheezes, rales, or rhonchi. Breathing is unlabored. Heart: RRR S1 S2 without murmurs, rubs, or gallops.  Abdomen: Soft, non-tender, non-distended with normoactive bowel sounds. No rebound/guarding. Extremities: No clubbing or cyanosis. No edema. Distal pedal pulses are 2+ and equal bilaterally. Neuro: Alert and oriented X 3. Left sided facial drop and leg weakness which pt states is chronic. Psych:  Responds to questions appropriately with a normal affect.  EKG:  The EKG was personally reviewed and demonstrates:    1) sinus tach 120bpm with baseline artifact noted especially inferiorly  but no overt STTW changes 2) f/u EKG not crossing over showed 82bpm with no STTW changes, normal tracing  Telemetry:  Telemetry was personally reviewed and demonstrates: NSR/ST  Relevant CV Studies: N/A  Laboratory Data:  High Sensitivity Troponin:   Recent Labs  Lab 02/18/21 1532  TROPONINIHS 3     Chemistry Recent Labs  Lab 02/16/21 0145 02/17/21 0307 02/18/21 0908  NA 138 140 138  K 3.7 3.5 3.3*  CL 101 102 103  CO2 27 29 27   GLUCOSE 122* 83 117*  BUN 11 15 9   CREATININE 0.69 0.70 0.57*  CALCIUM 8.9 8.6* 8.6*  GFRNONAA >60 >60 >60  ANIONGAP 10 9 8     Recent Labs  Lab 02/14/21 2314 02/18/21 0908  PROT 6.4* 6.5  ALBUMIN 3.6 3.2*  AST 18 22  ALT 14 15  ALKPHOS 70 65  BILITOT 0.5 0.6   Hematology Recent Labs  Lab 02/16/21 0145 02/17/21 0508 02/18/21 0908  WBC 13.3* 9.7 10.9*  RBC 4.40 4.15* 4.43  HGB 12.7* 12.0* 12.7*  HCT 37.6* 35.8* 37.4*  MCV 85.5 86.3 84.4  MCH 28.9 28.9 28.7  MCHC 33.8 33.5 34.0  RDW 13.9 14.2 13.6  PLT 252 226 260   BNPNo results for input(s): BNP, PROBNP in the last 168 hours.  DDimer  Recent Labs  Lab 02/18/21 1532  DDIMER 1.37*     Radiology/Studies:  CT Angio Chest Pulmonary Embolism (PE) W or WO Contrast  Result Date: 02/18/2021 CLINICAL DATA:  Chest pain, shortness of breath, possible fall EXAM: CT ANGIOGRAPHY CHEST  WITH CONTRAST TECHNIQUE: Multidetector CT imaging of the chest was performed using the standard protocol during bolus administration of intravenous contrast. Multiplanar CT image reconstructions and MIPs were obtained to evaluate the vascular anatomy. CONTRAST:  27mL OMNIPAQUE IOHEXOL 350 MG/ML SOLN COMPARISON:  Radiograph 02/18/2021 FINDINGS: Cardiovascular: Evaluation is tailored for the assessment of the pulmonary arteries. Satisfactory opacification of the pulmonary arteries to the lobar level with more distal segmental and subsegmental evaluation limited by suboptimal contrast timing. No large central  or lobar filling defects are identified central pulmonary arteries are normal caliber. Non tailored evaluation of the aorta. The aortic root is suboptimally assessed given cardiac pulsation artifact. No gross acute luminal abnormality of the aorta is visible. Minimal atherosclerotic plaque within the normal caliber aorta. Shared origin of the brachiocephalic and left common carotid arteries. Minimal plaque in the proximal great vessels. No major venous abnormalities. Mixing artifact in the SVC. Normal heart size. No pericardial effusion. Mediastinum/Nodes: No mediastinal fluid or gas. Normal thyroid gland and thoracic inlet. No acute abnormality of the trachea or esophagus. No worrisome mediastinal, hilar or axillary adenopathy. Subcentimeter partially calcified node towards the diaphragmatic hiatus. Lungs/Pleura: Numerous calcified nodules throughout both lungs, most pronounced anteriorly and in the upper lobes, likely calcified granulomata as sequela of prior granulomatous disease. Largest nodule seen in the right upper lobe measuring up to 13 mm in size. Background of centrilobular and paraseptal emphysematous change. No acute consolidation. No CT evidence of edema. No other concerning pulmonary nodules or masses. Bandlike opacities in the lung bases, likely atelectasis and/or scarring. Additional atelectatic changes dependently. Upper Abdomen: No acute abnormalities present in the visualized portions of the upper abdomen. Musculoskeletal: Degenerative changes are present in the imaged spine and shoulders. No acute osseous abnormality or suspicious osseous lesion. No visible displaced rib or sternal fractures. No acute fracture or traumatic osseous injury of the included shoulders. Degenerative changes in the bilateral shoulders with evidence of prior avascular necrosis of the humeral heads. No visible fracture or traumatic listhesis of the thoracic spine as included. Catheter tubing noted at the base of the neck  bilaterally extending inferiorly across the right anterior chest wall within the subcutaneous fat. Review of the MIP images confirms the above findings. IMPRESSION: 1. No evidence of pulmonary embolism to the lobar level with more distal evaluation limited by suboptimal contrast timing. 2. No acute traumatic findings in the chest. 3. Numerous scattered calcified nodules throughout both lungs, most pronounced anteriorly and in the upper lobes, as well as small calcified lymph nodes towards the hiatus. Likely reflect sequela of prior granulomatous disease in the chest. 4. 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 Correlate with patient history. Associated degenerative changes in the shoulders. 5. Centrilobular and paraseptal emphysema ( Emphysema (ICD10-J43.9).) 6.  Aortic Atherosclerosis (ICD10-I70.0). 7. Subcutaneous shunt catheter tubing noted at the base of the neck bilaterally and extending across the right anterior chest wall. Electronically Signed   By: Lovena Le M.D.   On: 02/18/2021 16:48   DG CHEST PORT 1 VIEW  Result Date: 02/18/2021 CLINICAL DATA:  50 year old male with shortness of breath and chest tightness. EXAM: PORTABLE CHEST 1 VIEW COMPARISON:  None FINDINGS: Minimal left lung base atelectasis/scarring. No focal consolidation, pleural effusion, or pneumothorax. There is a 12 mm nodule in the right mid lung field and a 5 mm right upper lung field nodule. Further evaluation with chest CT on a nonemergent/outpatient basis recommended. The cardiac silhouette is within normal limits. No acute osseous  pathology. IMPRESSION: 1. No acute cardiopulmonary process. 2. Right lung nodules. Follow-up with chest CT on a nonemergent/outpatient basis recommended. Electronically Signed   By: Anner Crete M.D.   On: 02/18/2021 16:10   DG C-Arm 1-60 Min  Result Date: 02/15/2021 CLINICAL DATA:  Left hip fracture repair. EXAM: DG C-ARM 1-60 MIN FLUOROSCOPY TIME:  Fluoroscopy Time:  1  minutes 41 seconds Radiation Exposure Index (if provided by the fluoroscopic device): 31.89 mGy Number of Acquired Spot Images: 2 COMPARISON:  February 14, 2021 FINDINGS: A gamma nail and intramedullary rod have been placed across the intertrochanteric fracture with improved alignment. A distal interlocking screw is noted. IMPRESSION: Left hip fracture repair as above. Electronically Signed   By: Dorise Bullion III M.D   On: 02/15/2021 13:06   DG HIP OPERATIVE UNILAT WITH PELVIS LEFT  Result Date: 02/15/2021 CLINICAL DATA:  Left hip fracture repair. EXAM: DG C-ARM 1-60 MIN FLUOROSCOPY TIME:  Fluoroscopy Time:  1 minutes 41 seconds Radiation Exposure Index (if provided by the fluoroscopic device): 31.89 mGy Number of Acquired Spot Images: 2 COMPARISON:  February 14, 2021 FINDINGS: A gamma nail and intramedullary rod have been placed across the intertrochanteric fracture with improved alignment. A distal interlocking screw is noted. IMPRESSION: Left hip fracture repair as above. Electronically Signed   By: Dorise Bullion III M.D   On: 02/15/2021 13:06   DG Hip Unilat W or Wo Pelvis 2-3 Views Left  Result Date: 02/15/2021 CLINICAL DATA:  Status post fall. EXAM: DG HIP (WITH OR WITHOUT PELVIS) 2-3V LEFT COMPARISON:  None. FINDINGS: An acute fracture deformity is seen extending through the inter trochanteric region of the proximal left femur. There is no evidence of dislocation. Moderate severity degenerative changes are seen involving both hips in the form of joint space narrowing and acetabular sclerosis. IMPRESSION: Acute fracture of the proximal left femur. Electronically Signed   By: Virgina Norfolk M.D.   On: 02/15/2021 00:32     Assessment and Plan:   1. Chest pain/dyspnea  - short lived episode - somewhat atypical, some features suggestive of anxiety - cannot fully exclude arrhythmia at the time as he was no longer on telemetry although tracing obtained soon after symptoms began showed sinus tach  (patient's previous HR not infrequently 90s-low 100s) without acute STT changes - troponins negative x 2 thus far, f/u EKG this evening was benign (not currently crossing over) - follow on telemetry - check TSH and f/u CBC in AM - agree with echocardiogram - OK to switch heparin back to DVT prophylaxis dosing from our standpoint -> order changed  2. Mechanical fall with L hip fx - per primary teams (IM, ortho)  3. Essential HTN - BP low normal at times, hold off HCTZ  4. Hypokalemia - being repleted by primary team  Remainder of medical issues per primary team.  Risk Assessment/Risk Scores:     HEAR Score (for undifferentiated chest pain):  HEAR Score: 4   For questions or updates, please contact [place] Please consult [email or web address] for contact info under    Signed, Charlie Pitter, PA-C  02/18/2021 5:54 PM  Patient seen, examined. Available data reviewed. Agree with findings, assessment, and plan as outlined by Melina Copa, PA-C. On my exam this evening: Vitals:   02/18/21 1531 02/18/21 1729  BP: 117/90 106/70  Pulse: 96 85  Resp:  17  Temp:  98.7 F (37.1 C)  SpO2: 95% 95%   Pt is alert and oriented, NAD HEENT:  normal except facial deformity noted Neck: JVP - normal, carotids 2+= without bruits Lungs: CTA bilaterally CV: RRR without murmur or gallop Abd: soft, NT, Positive BS, no hepatomegaly Ext: no C/C/E, distal pulses intact and equal Skin: warm/dry no rash  EKG is normal sinus rhythm, within normal limits with no ST/T wave changes.  Serial troponins are 3 and 3.  CT angiogram of the chest is negative for pulmonary embolus.    It is difficult to know what caused the patient's anxiety, tachycardia, and chest discomfort today.  He was not on telemetry at the time of the event and his EKG with symptoms showed sinus tachycardia and no other significant changes.  His follow-up tracing shows normal sinus rhythm.  He could have had an arrhythmia or a panic attack.   It does not seem like this is an acute ischemic event.  We will check an echocardiogram prior to discharge to make sure that there is no structural heart abnormality.  He does not have a pulmonary embolus based on his evaluation this afternoon with a CT angiogram.  We will follow-up in the morning after his echocardiogram but I would expect that he will be stable for discharge tomorrow.    Sherren Mocha, M.D. 02/18/2021 7:24 PM

## 2021-02-18 NOTE — Progress Notes (Signed)
ANTICOAGULATION CONSULT NOTE - Follow Up Consult  Pharmacy Consult for Heparin Indication: rule out PE  Allergies  Allergen Reactions   Penicillins     Patient Measurements: Height: 6\' 2"  (188 cm) Weight: 103.9 kg (229 lb) IBW/kg (Calculated) : 82.2 Heparin Dosing Weight: 104 kg  Vital Signs: Temp: 99.2 F (37.3 C) (06/29 1516) Temp Source: Oral (06/29 1516) BP: 117/90 (06/29 1531) Pulse Rate: 96 (06/29 1531)  Labs: Recent Labs    02/16/21 0145 02/17/21 0307 02/17/21 0508 02/18/21 0908  HGB 12.7*  --  12.0* 12.7*  HCT 37.6*  --  35.8* 37.4*  PLT 252  --  226 260  CREATININE 0.69 0.70  --  0.57*    Estimated Creatinine Clearance: 140.5 mL/min (A) (by C-G formula based on SCr of 0.57 mg/dL (L)).   Assessment: 50 year old with sudden onset of shortness of breath to begin heparin for rule out PE  Recent hip surgery 6/26  Goal of Therapy:  Heparin level 0.3-0.7 units/ml Monitor platelets by anticoagulation protocol: Yes   Plan:  Heparin 3000 units iv bolus x 1 given recent surgery Heparin drip at 1450 units / hr Heparin level 6 hours after heparin begins Daily heparin level, CBC  Thank you Anette Guarneri, PharmD 02/18/2021,3:49 PM

## 2021-02-18 NOTE — Progress Notes (Signed)
Pt seen in room c/o SOB/chest tightness. V/S checked HR in 110's, cbg 156. Pt remains alert/oriented in  no acute distress. Attending MD made aware of above,rapid respnse called.

## 2021-02-18 NOTE — Progress Notes (Signed)
OT Cancellation Note  Patient Details Name: Roger Morton MRN: [id number] DOB: 19-Apr-1971   Cancelled Treatment:    Reason Eval/Treat Not Completed: Medical issues which prohibited therapy (Per RN, pt experiencing chest pain & tighness. He is being moved to cardiac floor. OT to follow as ordered adn appropriate.)  Shekera Beavers A Sigurd Pugh 02/18/2021, 3:33 PM

## 2021-02-18 NOTE — Progress Notes (Signed)
ANTICOAGULATION CONSULT NOTE - Follow Up Consult  Pharmacy Consult for IV Heparin >> SQ Heparin Indication: rule out PE (ruled out)  Allergies  Allergen Reactions   Penicillins     Patient Measurements: Height: 6\' 2"  (188 cm) Weight: 103.9 kg (229 lb) IBW/kg (Calculated) : 82.2 Heparin Dosing Weight: 104 kg  Vital Signs: Temp: 98.7 F (37.1 C) (06/29 1729) Temp Source: Oral (06/29 1729) BP: 106/70 (06/29 1729) Pulse Rate: 85 (06/29 1729)  Labs: Recent Labs    02/16/21 0145 02/17/21 0307 02/17/21 0508 02/18/21 0908 02/18/21 1532 02/18/21 1653  HGB 12.7*  --  12.0* 12.7*  --   --   HCT 37.6*  --  35.8* 37.4*  --   --   PLT 252  --  226 260  --   --   CREATININE 0.69 0.70  --  0.57*  --   --   TROPONINIHS  --   --   --   --  3 3    Estimated Creatinine Clearance: 140.5 mL/min (A) (by C-G formula based on SCr of 0.57 mg/dL (L)).  Assessment: 50 year old with recent hip surgery (02/15/21) with sudden onset of shortness of breath to begin IV heparin for rule out PE. Pt was given heparin 3000 units IV bolus X 1, followed by heparin infusion at 1450 units/hr.   Chest CT: no evidence of PE; heparin infusion has been discontinued  H/H 12.7/37.4, plt 260; per RN, no bleeding observed  Goal of Therapy:  Prevention of VTE Monitor platelets by anticoagulation protocol: Yes   Plan:  Heparin 5000 units SQ Q 8 hrs, starting tonight Monitor CBC Monitor for bleeding  Gillermina Hu, PharmD, BCPS, [facility] Clinical Pharmacist 02/18/2021,7:13 PM

## 2021-02-18 NOTE — Significant Event (Signed)
Rapid Response Event Note   Reason for Call: SOB and Chest Pain  Initial Focused Assessment: On arrival, pt sitting upright in bed, alert and oriented. Endorses chest pain chest tightness. Breathing is mildly labored. Lung sounds are diminished bilaterally. Skin is warm and dry.   After first nitro dose, pt does not have relief from chest pressure/tightness. Also having increased anxiety. 12 lead EKG showed ST. Breathing does not appear labored. Complains of weakness and dizziness.  After return from CT, pts chest pain was better.  VS: BP 131/110, HR 122, RR 28, O2 99 on 4L [place], T 99.2 CBG: 156 1516 VS: BP 109/66, HR 125, O2 96% 4L [place] 1522 VS: BP 100/78, HR 101, O2 97% 4L [place] 1532 VS: BP 117/90, HR 99, O2 96% 4L [place]  Interventions:  -STAT 12 lead EKG -STAT CXR -Sl Nitro x1 -Metoprolol 5mg  IV -STAT D-Dimer and Troponin -STAT Chest CT to r/o PE -PIV started  Plan of Care:  -Transfer pt to med-tele 6E -Echo ordered -Cards to consult  Event Summary:   MD Notified: Dr. Alfredia Ferguson at bedside Call Time: [place] Time: 1500 End Time: [place]  Netta Corrigan, RN

## 2021-02-18 NOTE — Plan of Care (Signed)
  Problem: Activity: Goal: Ability to avoid complications of mobility impairment will improve Outcome: Progressing   Problem: Education: Goal: Verbalization of understanding the information provided will improve Outcome: Progressing   Problem: Coping: Goal: Level of anxiety will decrease Outcome: Progressing   Problem: Pain Management: Goal: Pain level will decrease Outcome: Progressing

## 2021-02-18 NOTE — Progress Notes (Signed)
The Patient was set to be discharged today to SNF however prior to discharge she developed chest pain and shortness of breath and became.  Bedside evaluation was done and he showed a sinus tachycardic and he was dyspneic and had to be put on 4 L.  Patient was very anxious at this time and stat troponins and EKG and chest x-ray were ordered as well as a D-dimer.  Patient was given sublingual morphine IV metoprolol and a 500 MLS bolus.  This helped his heart rate but he continued to have chest pain.  Because of his recent surgery there is concern for acute PE so CT PE protocol was done with negative for PE.  He was empirically started on IV heparin.  Cardiology was consulted and echocardiogram was obtained.  His troponins are flat.  EKG was personally reviewed, sinus tachycardia.  Appreciate further cardiac evaluation for his chest pain and dyspnea.  Patient was transferred to cardiac telemetry to 6 E.  His discharge has been held today and will await cardiology evaluation recommendations and if he is stable tomorrow can likely be discharged to a skilled nursing facility.

## 2021-02-18 NOTE — Progress Notes (Signed)
PT Cancellation Note  Patient Details Name: Roger Morton MRN: [id number] DOB: Jul 05, 1971   Cancelled Treatment:    Reason Eval/Treat Not Completed: (P) Medical issues which prohibited therapy (Pt to be transferred to cardiac unit as he presents with chest pain and tightness per RN.)   Cristela Blue 02/18/2021, 4:17 PM  Erasmo Leventhal , PTA Acute Rehabilitation Services Pager [phone] Office [phone]

## 2021-02-18 NOTE — TOC Transition Note (Addendum)
Transition of Care [facility]) - CM/SW Discharge Note   Patient Details  Name: Roger Morton MRN: [id number] Date of Birth: Apr 30, 1971  Transition of Care [facility]) CM/SW Contact:  Milinda Antis, [place] Phone Number: 02/18/2021, 2:03 PM   Clinical Narrative:    Patient will DC to: [place] date: 02/18/2021 Family notified: YES Transport by: Corey Harold   Per MD patient ready for DC to SNF. RN to call report prior to discharge [phone]. RN, patient, patient's family, and facility notified of DC. Discharge Summary and FL2 sent to facility. DC packet on chart. Ambulance transport will be requested for patient when RN reports that patient is ready.   CSW will sign off for now as social work intervention is no longer needed. Please consult [place] again if new needs arise.  13:15-  CSW notified by RN that the d/c is on hold.      Final next level of care: [place] Barriers to Discharge: No Barriers Identified   Patient Goals and CMS Choice        Discharge Placement              Patient chooses bed at: [facility] Patient to be transferred to facility by: [place] Name of family member notified: Dwyne, [place] (Brother)   [phone] Patient and family notified of of transfer: 02/18/21  Discharge Plan and Services                                     Social Determinants of Health (SDOH) Interventions     Readmission Risk Interventions No flowsheet data found.

## 2021-02-19 ENCOUNTER — Inpatient Hospital Stay (HOSPITAL_COMMUNITY): Payer: Medicare Other

## 2021-02-19 DIAGNOSIS — R0609 Other forms of dyspnea: Secondary | ICD-10-CM

## 2021-02-19 DIAGNOSIS — R079 Chest pain, unspecified: Secondary | ICD-10-CM

## 2021-02-19 LAB — MAGNESIUM: Magnesium: 2.1 mg/dL (ref 1.7–2.4)

## 2021-02-19 LAB — ECHOCARDIOGRAM COMPLETE
AR max vel: 3.01 cm2
AV Area VTI: 2.71 cm2
AV Area mean vel: 2.95 cm2
AV Mean grad: 3 mmHg
AV Peak grad: 4.9 mmHg
Ao pk vel: 1.11 m/s
Area-P 1/2: 2.97 cm2
Height: 74 in
MV VTI: 2.29 cm2
S' Lateral: 2.7 cm
Weight: 3664 oz

## 2021-02-19 LAB — COMPREHENSIVE METABOLIC PANEL
ALT: 15 U/L (ref 0–44)
AST: 20 U/L (ref 15–41)
Albumin: 3.1 g/dL — ABNORMAL LOW (ref 3.5–5.0)
Alkaline Phosphatase: 60 U/L (ref 38–126)
Anion gap: 10 (ref 5–15)
BUN: 11 mg/dL (ref 6–20)
CO2: 23 mmol/L (ref 22–32)
Calcium: 8.8 mg/dL — ABNORMAL LOW (ref 8.9–10.3)
Chloride: 104 mmol/L (ref 98–111)
Creatinine, Ser: 0.61 mg/dL (ref 0.61–1.24)
GFR, Estimated: >60 mL/min (ref >60)
Glucose, Bld: 102 mg/dL — ABNORMAL HIGH (ref 70–99)
Potassium: 4 mmol/L (ref 3.5–5.1)
Sodium: 137 mmol/L (ref 135–145)
Total Bilirubin: 0.9 mg/dL (ref 0.3–1.2)
Total Protein: 6 g/dL — ABNORMAL LOW (ref 6.5–8.1)

## 2021-02-19 LAB — CBC
HCT: 36.5 % — ABNORMAL LOW (ref 39.0–52.0)
Hemoglobin: 12.3 g/dL — ABNORMAL LOW (ref 13.0–17.0)
MCH: 28.7 pg (ref 26.0–34.0)
MCHC: 33.7 g/dL (ref 30.0–36.0)
MCV: 85.3 fL (ref 80.0–100.0)
Platelets: 242 10*3/uL (ref 150–400)
RBC: 4.28 MIL/uL (ref 4.22–5.81)
RDW: 13.8 % (ref 11.5–15.5)
WBC: 9.1 10*3/uL (ref 4.0–10.5)
nRBC: 0 % (ref 0.0–0.2)

## 2021-02-19 LAB — TSH: TSH: 1.796 u[IU]/mL (ref 0.350–4.500)

## 2021-02-19 LAB — PHOSPHORUS: Phosphorus: 3.5 mg/dL (ref 2.5–4.6)

## 2021-02-19 MED ORDER — SALINE SPRAY 0.65 % NA SOLN
1.0000 | NASAL | Status: DC | PRN
  Administered 2021-02-19 (×2): 1 via NASAL
  Filled 2021-02-19: qty 44

## 2021-02-19 MED ORDER — COVID-19 MRNA VACC (MODERNA) 50 MCG/0.25ML IM SUSP
0.2500 mL | Freq: Once | INTRAMUSCULAR | Status: AC
  Administered 2021-02-19: 0.25 mL via INTRAMUSCULAR
  Filled 2021-02-19: qty 0.25

## 2021-02-19 MED ORDER — PERFLUTREN LIPID MICROSPHERE
1.0000 mL | INTRAVENOUS | Status: AC | PRN
  Administered 2021-02-19: 3 mL via INTRAVENOUS
  Filled 2021-02-19: qty 10

## 2021-02-19 NOTE — TOC Progression Note (Signed)
Transition of Care [facility]) - Progression Note    Patient Details  Name: NILO FALLIN MRN: [id number] Date of Birth: [birth year]  Transition of Care [facility]) CM/SW [place], [place] Phone Number: 02/19/2021, 4:15 PM  Clinical Narrative:     CSW spoke with [facility] liaison; confirmed pt could come today. CSW is informed that pt would need to have a 2nd booster shot. CSW received vaccination records from [facility]. He only has 1 booster but is agreeable to get 2nd. Attending ordered.   [place] informed CSW they will need DC summary by 445p today in order for pt to DC today. Attending informed. Waiting on ECHO results at this time.      Barriers to Discharge: No Barriers Identified  Expected Discharge Plan and Services           Expected Discharge Date: 02/18/21                                     Social Determinants of Health (SDOH) Interventions    Readmission Risk Interventions No flowsheet data found.

## 2021-02-19 NOTE — Progress Notes (Addendum)
Progress Note  Patient Name: Roger Morton Date of Encounter: 02/19/2021  [facility] Cardiologist: None   Subjective   No complaints other than he's hot. Wants a bigger fan.   Inpatient Medications    Scheduled Meds:  aspirin  81 mg Oral BID   atorvastatin  40 mg Oral QHS   baclofen  10 mg Oral TID   busPIRone  5 mg Oral BID   docusate sodium  100 mg Oral BID   gabapentin  100 mg Oral QHS   heparin injection (subcutaneous)  5,000 Units Subcutaneous Q8H   hydrOXYzine  25 mg Oral TID   levETIRAcetam  750 mg Oral BID   melatonin  3 mg Oral QHS   mupirocin ointment  1 application Nasal BID   nicotine  14 mg Transdermal Daily   pantoprazole  40 mg Oral Daily   polyethylene glycol  17 g Oral BID   senna-docusate  2 tablet Oral QHS   tamsulosin  0.4 mg Oral QPC breakfast   Continuous Infusions:  methocarbamol (ROBAXIN) IV     PRN Meds: acetaminophen, bisacodyl, diphenhydrAMINE, HYDROmorphone (DILAUDID) injection, LORazepam **OR** LORazepam, menthol-cetylpyridinium **OR** phenol, methocarbamol **OR** methocarbamol (ROBAXIN) IV, metoCLOPramide **OR** metoCLOPramide (REGLAN) injection, naLOXone (NARCAN)  injection, nitroGLYCERIN, ondansetron **OR** ondansetron (ZOFRAN) IV, oxyCODONE   Vital Signs    Vitals:   02/18/21 1531 02/18/21 1729 02/19/21 0615 02/19/21 0755  BP: 117/90 106/70 126/81 133/89  Pulse: 96 85 96 88  Resp:  17 (!) 24 16  Temp:  98.7 F (37.1 C) 98.5 F (36.9 C) 98.5 F (36.9 C)  TempSrc:  Oral Oral Oral  SpO2: 95% 95% 95% 96%  Weight:      Height:        Intake/Output Summary (Last 24 hours) at 02/19/2021 0932 Last data filed at 02/19/2021 0616 Gross per 24 hour  Intake 46.66 ml  Output 2600 ml  Net -2553.34 ml   Last 3 Weights 02/14/2021 02/09/2021 02/04/2021  Weight (lbs) 229 lb 229 lb 229 lb  Weight (kg) 103.874 kg 103.874 kg 103.874 kg      Telemetry    SR, few episodes of ST- Personally Reviewed  ECG    No new tracing this  morning.  Physical Exam   GEN: No acute distress.   Neck: No JVD Cardiac: RRR, no murmurs, rubs, or gallops.  Respiratory: Clear to auscultation bilaterally. GI: Soft, nontender, non-distended  MS: No edema; No deformity. Neuro:  Nonfocal, left left facial droop (chronic) Psych: Normal affect   Labs    High Sensitivity Troponin:   Recent Labs  Lab 02/18/21 1532 02/18/21 1653  TROPONINIHS 3 3      Chemistry Recent Labs  Lab 02/14/21 2314 02/15/21 0501 02/16/21 0145 02/17/21 0307 02/18/21 0908  NA 141   < > 138 140 138  K 3.3*   < > 3.7 3.5 3.3*  CL 104   < > 101 102 103  CO2 28   < > 27 29 27   GLUCOSE 106*   < > 122* 83 117*  BUN 14   < > 11 15 9   CREATININE 0.75   < > 0.69 0.70 0.57*  CALCIUM 9.0   < > 8.9 8.6* 8.6*  PROT 6.4*  --   --   --  6.5  ALBUMIN 3.6  --   --   --  3.2*  AST 18  --   --   --  22  ALT 14  --   --   --  15  ALKPHOS 70  --   --   --  65  BILITOT 0.5  --   --   --  0.6  GFRNONAA >60   < > >60 >60 >60  ANIONGAP 9   < > 10 9 8    < > = values in this interval not displayed.     Hematology Recent Labs  Lab 02/17/21 0508 02/18/21 0908 02/19/21 0118  WBC 9.7 10.9* 9.1  RBC 4.15* 4.43 4.28  HGB 12.0* 12.7* 12.3*  HCT 35.8* 37.4* 36.5*  MCV 86.3 84.4 85.3  MCH 28.9 28.7 28.7  MCHC 33.5 34.0 33.7  RDW 14.2 13.6 13.8  PLT 226 260 242    BNPNo results for input(s): BNP, PROBNP in the last 168 hours.   DDimer  Recent Labs  Lab 02/18/21 1532  DDIMER 1.37*     Radiology    CT Angio Chest Pulmonary Embolism (PE) W or WO Contrast  Result Date: 02/18/2021 CLINICAL DATA:  Chest pain, shortness of breath, possible fall EXAM: CT ANGIOGRAPHY CHEST WITH CONTRAST TECHNIQUE: Multidetector CT imaging of the chest was performed using the standard protocol during bolus administration of intravenous contrast. Multiplanar CT image reconstructions and MIPs were obtained to evaluate the vascular anatomy. CONTRAST:  56mL OMNIPAQUE IOHEXOL 350 MG/ML  SOLN COMPARISON:  Radiograph 02/18/2021 FINDINGS: Cardiovascular: Evaluation is tailored for the assessment of the pulmonary arteries. Satisfactory opacification of the pulmonary arteries to the lobar level with more distal segmental and subsegmental evaluation limited by suboptimal contrast timing. No large central or lobar filling defects are identified central pulmonary arteries are normal caliber. Non tailored evaluation of the aorta. The aortic root is suboptimally assessed given cardiac pulsation artifact. No gross acute luminal abnormality of the aorta is visible. Minimal atherosclerotic plaque within the normal caliber aorta. Shared origin of the brachiocephalic and left common carotid arteries. Minimal plaque in the proximal great vessels. No major venous abnormalities. Mixing artifact in the SVC. Normal heart size. No pericardial effusion. Mediastinum/Nodes: No mediastinal fluid or gas. Normal thyroid gland and thoracic inlet. No acute abnormality of the trachea or esophagus. No worrisome mediastinal, hilar or axillary adenopathy. Subcentimeter partially calcified node towards the diaphragmatic hiatus. Lungs/Pleura: Numerous calcified nodules throughout both lungs, most pronounced anteriorly and in the upper lobes, likely calcified granulomata as sequela of prior granulomatous disease. Largest nodule seen in the right upper lobe measuring up to 13 mm in size. Background of centrilobular and paraseptal emphysematous change. No acute consolidation. No CT evidence of edema. No other concerning pulmonary nodules or masses. Bandlike opacities in the lung bases, likely atelectasis and/or scarring. Additional atelectatic changes dependently. Upper Abdomen: No acute abnormalities present in the visualized portions of the upper abdomen. Musculoskeletal: Degenerative changes are present in the imaged spine and shoulders. No acute osseous abnormality or suspicious osseous lesion. No visible displaced rib or sternal  fractures. No acute fracture or traumatic osseous injury of the included shoulders. Degenerative changes in the bilateral shoulders with evidence of prior avascular necrosis of the humeral heads. No visible fracture or traumatic listhesis of the thoracic spine as included. Catheter tubing noted at the base of the neck bilaterally extending inferiorly across the right anterior chest wall within the subcutaneous fat. Review of the MIP images confirms the above findings. IMPRESSION: 1. No evidence of pulmonary embolism to the lobar level with more distal evaluation limited by suboptimal contrast timing. 2. No acute traumatic findings in the chest. 3. Numerous scattered calcified nodules throughout both lungs,  most pronounced anteriorly and in the upper lobes, as well as small calcified lymph nodes towards the hiatus. Likely reflect sequela of prior granulomatous disease in the chest. 4. 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 Correlate with patient history. Associated degenerative changes in the shoulders. 5. Centrilobular and paraseptal emphysema ( Emphysema (ICD10-J43.9).) 6.  Aortic Atherosclerosis (ICD10-I70.0). 7. Subcutaneous shunt catheter tubing noted at the base of the neck bilaterally and extending across the right anterior chest wall. Electronically Signed   By: Lovena Le M.D.   On: 02/18/2021 16:48   DG CHEST PORT 1 VIEW  Result Date: 02/18/2021 CLINICAL DATA:  50 year old male with shortness of breath and chest tightness. EXAM: PORTABLE CHEST 1 VIEW COMPARISON:  None FINDINGS: Minimal left lung base atelectasis/scarring. No focal consolidation, pleural effusion, or pneumothorax. There is a 12 mm nodule in the right mid lung field and a 5 mm right upper lung field nodule. Further evaluation with chest CT on a nonemergent/outpatient basis recommended. The cardiac silhouette is within normal limits. No acute osseous pathology. IMPRESSION: 1. No acute cardiopulmonary process. 2.  Right lung nodules. Follow-up with chest CT on a nonemergent/outpatient basis recommended. Electronically Signed   By: Anner Crete M.D.   On: 02/18/2021 16:10    Cardiac Studies   Echo: pending  Patient Profile     50 y.o. male with a hx of brain tumor s/p surgery/intracranial shunt x2 with post-op stroke with contractures, seizure disorder, neuropathy, ?HTN (pt denies, on HCTZ), borderline HLD (on statin), BPH, GERD, chronic appearing baseline elevated HR who is being seen 02/18/2021 for the evaluation of chest pain at the request of Dr. Alfredia Ferguson.  Assessment & Plan    Chest pain/dyspnea: developed a short lived episode yesterday with somewhat atypical, some features suggestive of anxiety. hsTn troponin were negative. Reports another episode last evening, felt flushed at the time. No further episodes overnight or this morning.  -- TSH WNL, f/u CBC stable -- echo pending, suspect he can be discharged if this is stable from cardiology perspective    Mechanical fall with L hip fx: per primary teams (IM, ortho)   Essential HTN: BP has been low at times, but stable without antihypertensives   Hypokalemia: K+ 3.3 yesterday. Supplemented  Seizure disorder/CVA with contractures: hx of brain tumor/shunt x2   For questions or updates, please contact [place] Please consult [email or web address] for contact info under        Signed, Reino Bellis, NP  02/19/2021, 9:32 AM    Patient seen, examined. Available data reviewed. Agree with findings, assessment, and plan as outlined by Reino Bellis, NP.  The patient is independently evaluated and examined.  He is alert, oriented, in no distress.  Lungs are clear, heart is regular rate and rhythm no murmur gallop, abdomen soft nontender, extremities have no edema, skin is warm dry with no rash.  Telemetry is reviewed and shows normal sinus rhythm with no arrhythmia.  The patient had an episode of atypical chest pain yesterday.  Serial troponins  are negative.  He is awaiting an echocardiogram today.  As soon as his echo is interpreted, and as long as there are no significant findings, he can be discharged today.  No further cardiac evaluation indicated unless he has recurrent symptoms.  Sherren Mocha, M.D. 02/19/2021 12:40 PM

## 2021-02-19 NOTE — Progress Notes (Signed)
Occupational Therapy Treatment Patient Details Name: Roger Morton MRN: [id number] DOB: 1971/03/12 Today's Date: 02/19/2021    History of present illness Pt is a 50 y.o. male admitted 02/14/21 after fall at home sustaining L hip fx. S/p L intertrochanteric IM nail 6/26. Pt with c/o chest tightness and SOB 6/29 with transfer to med-tele 6E; suspect atypical chest pain, now resolved. PMH includes CVA, seizure disorder, neuropathy, HTN.   OT comments  Pt making progress with OT goals this session. Pt transferred to chair with lateral scoots, then completed 2 sit<>stands from the chair with mod/max A +2. Additionally pt worked on grooming in sitting and LB bathing. Pt motivated to work with therapy and appreciates encouragement. Acute OT will continue to follow up.    Follow Up Recommendations  SNF;Supervision/Assistance - 24 hour    Equipment Recommendations  None recommended by OT    Recommendations for Other Services      Precautions / Restrictions Precautions Precautions: Fall;Other (comment) Precaution Comments: Residual L-side weakness from stroke Restrictions Weight Bearing Restrictions: Yes LLE Weight Bearing: Partial weight bearing LLE Partial Weight Bearing Percentage or Pounds: 50%       Mobility Bed Mobility Overal bed mobility: Needs Assistance Bed Mobility: Supine to Sit     Supine to sit: Supervision;HOB elevated     General bed mobility comments: Pt able to come to long sitting from elevated HOB with bed rail, cues to use BUEs to assist LLE to EOB, able to do so without assist    Transfers Overall transfer level: Needs assistance Equipment used: None;Rolling walker (2 wheeled) Transfers: Lateral/Scoot Transfers;Sit to/from Stand Sit to Stand: Min guard         General transfer comment: Lateral scoot from EOB to drop-arm recliner towards R-side, min guard for balance, cues for sequencing. Performed 2x sit<>stand trials from recliner to RW, pt with very  poor tolerance to accept WB onto LLE, cues for sequencing with heavy reliance on BUE/RLE support, modA+2 to assist trunk elevation and maintain stability when transitioning RUE support to RW; difficulty achieving full hip extension due to pain, very minimal WB through LLE    Balance Overall balance assessment: Needs assistance Sitting-balance support: Feet supported;No upper extremity supported Sitting balance-Leahy Scale: Fair     Standing balance support: Bilateral upper extremity supported Standing balance-Leahy Scale: Poor Standing balance comment: Reliant on BUE support and external assist; dependent for posterior pericare                           ADL either performed or assessed with clinical judgement   ADL Overall ADL's : Needs assistance/impaired     Grooming: Wash/dry face;Wash/dry hands;Applying deodorant;Set up;Sitting       Lower Body Bathing: Maximal assistance;Sit to/from stand           Toilet Transfer: Min guard;Requires drop arm (Pt able to scoot himself from bed to chair)           Functional mobility during ADLs: Maximal assistance;+2 for physical assistance;+2 for safety/equipment General ADL Comments: Pt completed grooming with set up in recliner, required max A for LB bathing in standing due to balance deficits, transfers pt could scoot himself with no assist and in standing pt required mod/max A +2     Vision   Vision Assessment?: No apparent visual deficits   Perception     Praxis      Cognition Arousal/Alertness: Awake/alert Behavior During Therapy: WFL for tasks assessed/performed;Anxious  Overall Cognitive Status: History of cognitive impairments - at baseline                                 General Comments: Pt with baseline h/o strokes, brain tumor resection, and recent concussion. Pt follows majority of simple, one-step commands, requires some repetition of cues and commands; pt repeating some conversation. Pt  responds well to motivation/encouragement and humor        Exercises General Exercises - Lower Extremity Long Arc Quad: AAROM;Left;Seated Heel Slides: AAROM;Left;Seated Straight Leg Raises: PROM;Left (small range)   Shoulder Instructions       General Comments RN aware pt needing new dressings on neck wound    Pertinent Vitals/ Pain       Pain Assessment: Faces Faces Pain Scale: Hurts whole lot Pain Location: L thigh Pain Descriptors / Indicators: Grimacing;Guarding;Moaning Pain Intervention(s): Limited activity within patient's tolerance;Monitored during session;Premedicated before session;Repositioned  Home Living                                          Prior Functioning/Environment              Frequency  Min 2X/week        Progress Toward Goals  OT Goals(current goals can now be found in the care plan section)  Progress towards OT goals: Progressing toward goals  Acute Rehab OT Goals Patient Stated Goal: to get back on his feet to go home, likes to hunt and fish OT Goal Formulation: With patient Time For Goal Achievement: 03/02/21 Potential to Achieve Goals: Fair ADL Goals Pt Will Perform Grooming: standing;with supervision Pt Will Perform Lower Body Bathing: with set-up;sit to/from stand Pt Will Perform Lower Body Dressing: with set-up;sit to/from stand Pt Will Transfer to Toilet: with min guard assist;bedside commode;stand pivot transfer Pt Will Perform Toileting - Clothing Manipulation and hygiene: with min guard assist  Plan Discharge plan remains appropriate;Frequency remains appropriate    Co-evaluation    PT/OT/SLP Co-Evaluation/Treatment: Yes Reason for Co-Treatment: For patient/therapist safety;To address functional/ADL transfers PT goals addressed during session: Mobility/safety with mobility;Balance;Proper use of DME OT goals addressed during session: ADL's and self-care;Proper use of Adaptive equipment and DME       AM-PAC OT "6 Clicks" Daily Activity     Outcome Measure   Help from another person eating meals?: A Little Help from another person taking care of personal grooming?: A Little Help from another person toileting, which includes using toliet, bedpan, or urinal?: A Lot Help from another person bathing (including washing, rinsing, drying)?: A Lot Help from another person to put on and taking off regular upper body clothing?: A Little Help from another person to put on and taking off regular lower body clothing?: A Lot 6 Click Score: 15    End of Session Equipment Utilized During Treatment: Gait belt;Rolling walker  OT Visit Diagnosis: Unsteadiness on feet (R26.81);Muscle weakness (generalized) (M62.81);History of falling (Z91.81);Other abnormalities of gait and mobility (R26.89);Pain;Hemiplegia and hemiparesis Hemiplegia - Right/Left: Left Hemiplegia - dominant/non-dominant: Non-Dominant Pain - Right/Left: Left Pain - part of body: Leg   Activity Tolerance Patient limited by pain   Patient Left in chair;with call bell/phone within reach;with chair alarm set   Nurse Communication Mobility status;Precautions;Weight bearing status        Time: 7062-3762 OT Time Calculation (min):  24 min  Charges: OT General Charges $OT Visit: 1 Visit OT Treatments $Self Care/Home Management : 8-22 mins  Maan Zarcone H., OTR/L Acute Rehabilitation  Maddalyn Lutze Elane Yolanda Bonine 02/19/2021, 4:56 PM

## 2021-02-19 NOTE — Discharge Summary (Signed)
Physician Discharge Summary  Roger Morton OBS:[id number] DOB: Apr 01, 1971 DOA: 02/14/2021  PCP: Noreene Larsson, NP  Admit date: 02/14/2021 Discharge date: 02/20/2021  Admitted From: Home Disposition: SNF  Recommendations for Outpatient Follow-up:  Follow up with PCP in 1-2 weeks Follow up with Orthopedic Surgery Follow up with Cardiology as an outpatient  Follow up as an outpatient for Lung Nodules  Please obtain CMP/CBC, Mag, Phos in one week Please follow up on the following pending results:  Home Health: No Equipment/Devices: None  Discharge Condition: Stable CODE STATUS: FULL CODE  Diet recommendation:   Brief/Interim Summary: The patient is a 50 year old overweight Caucasian male with a past medical history significant for but not limited to CVA with contractures, seizure disorder, neuropathy, hypertension, BPH, GERD as well as other comorbidities who presented to the hospital after a fall.  He had a resultant left hip fracture and orthopedic surgery was consulted.  He underwent IM nail placement on 02/15/2021.  Ortho recommended partial weightbearing as tolerated and PT OT consulted and recommending SNF.  Patient is currently medically stable to be discharged to SNF and will need to follow-up with PCP as well as orthopedic surgery in outpatient setting in 1 to 2 weeks.   The Patient was set to be discharged to SNF on 02/18/21 however prior to discharge she developed chest pain and shortness of breath and became.  Bedside evaluation was done and he showed a sinus tachycardic and he was dyspneic and had to be put on 4 L.  Patient was very anxious at this time and stat troponins and EKG and chest x-ray were ordered as well as a D-dimer.  Patient was given sublingual morphine IV metoprolol and a 500 MLS bolus.  This helped his heart rate but he continued to have chest pain.  Because of his recent surgery there is concern for acute PE so CT PE protocol was done with negative for PE.  He was  empirically started on IV heparin.  Cardiology was consulted and echocardiogram was obtained.  His troponins are flat.  EKG was personally reviewed, sinus tachycardia.  Appreciate further cardiac evaluation for his chest pain and dyspnea.  Patient was transferred to cardiac telemetry to 6 E.  His discharge was held and Cardiology consulted. Cardiology evaluated and recommending getting ECHO and if was normal patient could be Discharged. ECHO finally done late afternoon 02/19/21 and showed Normal EF to 60-65%. Anticipating D/C to SNF this AM as he is now Medically Stable.   Discharge Diagnoses:  Active Problems:   Seizure disorder (HCC)   Femur fracture (HCC)   Leukocytosis   Hyperlipidemia   GERD (gastroesophageal reflux disease)  Left Proximal Femur Fracture -In the setting of Fall -Orthopedic Surgery was consulted and IM Nail placement was performed on 02/15/21 -VTE Prophylaxis ASA 81 mg po BID x 4 Weeks -Ortho Recommendind PWB LLE 50% and recommending that the Dressings remain in place until follow up in 2 weeks in Clinic with Dr. Alvan Dame -PT/OT evaluated and recommending SNF with [employer] Recommendations for Wheelchair, Wheelchair Cushion and Hospital Bed -Pain Control with Acetaminophen 325-650 mg po q6hprn Mild Pain, Oxycodone IR 5-10 mg po q4hprn Moderate/Sever Pain, and IV Hydromorphone 0.5-1 mg IV q4hprn Severe Pain -C/w Robaxin 500 mg po/IV q6hprn   Atypical Chest Pain/Dyspnea and Sinus Tachycardia, improving -Developed Chest Pain 7/10 in Severity and described it as a squeezing and became Dyspneic and Tachypenic yesterday afternoon and had to be placed on O2; Had another episode yesterday evening -  Troponin Negative -EKG showed Sinus Tachycardia -TSH checked and Normal -SpO2: 94 % O2 Flow Rate (L/min): 4 L/min previously and Now on Room Air  -CTA PE done and showed "No evidence of pulmonary embolism to the lobar level with more distal evaluation limited by suboptimal contrast timing.  No acute traumatic findings in the chest. Numerous  scattered calcified nodules throughout both lungs, most pronounced anteriorly and in the upper lobes, as  well as small calcified lymph nodes towards the hiatus. Likely reflect sequela of prior granulomatous disease in the chest. 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 Correlate with patient history. Associated degenerative changes in the shoulders. Centrilobular and paraseptal emphysema ( Emphysema (ICD10-J43.9).) Aortic Atherosclerosis (ICD10-I70.0).  Subcutaneous shunt catheter tubing noted at the base of the neck bilaterally and extending across the right anterior chest wall." -Cardiology consulted and recommending checking ECHO -ECHO showed Normal EF and Normal Diastolic Fxn -Patient no longer on O2 and no longer having Chest Pain; He is stable to D/C; Social work to arrange transport to SNF in AM    Leukocytosis -Likely reactive. WBC of 13.7k initially. Mild. No evidence of infection at this time. Resolved yesterday but slightly worse the day before yesterday at 10.9 and now 9.7 today  -Continue to monitor and Trend and Repeat CMP within 1 week   Lung Nodules -Noted on CT Scan and CXR -CT Scan showed "Numerous  scattered calcified nodules throughout both lungs, most pronounced anteriorly and in the upper lobes, as  well as small calcified lymph nodes towards the hiatus. Likely reflect sequela of prior granulomatous disease in the chest" -CXR showed "Minimal left lung base atelectasis/scarring. No focal consolidation, pleural effusion, or pneumothorax. There is a 12 mm nodule in the right mid lung field and a 5 mm right upper lung field nodule. Further evaluation with chest CT on a nonemergent/outpatient basis recommended. The cardiac silhouette  is within normal limits. No acute osseous pathology." -Outpatient follow up    Normocytic Anemia -Mild and Stable. -Hgb/Hct is now 12.3/36.5 yesterday and today is  11.9/36.0 -Check Anemia Panel in the outpatient setting -Continue to Monitor for S/Sx of Bleeding; No overt Bleeding Noted -Repeat CBC within 1 week   Seizure Disorder -Continue home Levetiracetam 750 mg po BID -Patient has 2 mg of IV Lorazepam q6hprn for Seizures   Hypokalemia -Mild with a K+ of 3.3 and improved to 4.0 yesterday and today is 3.5 -Replete with po Kcl 40 mEQ BID again  -Continue to Monitor and Replete as Necessary -Repeat CMP within 1 week   Neuropathy Hx of CVA and Contractures -Had a Brain tumor and had CVAs during his operation -Continue Gabapentin 100 mg po qHS and Baclofen 10 mg TID   Hyperlipidemia -Continue Atorvastatin 40 mg po qHS   BPH -Continue Tamsulosin 0.4 mg po Daily   GERD -Continue Pantoprazole 40 mg po Daily   Constipation -Continue Senokot-S 2 tab po qHS -Switch to Miralax BID, decrease to daily or prn once having more regular bowel movements and c/w Dousate 100 mg po BID -Given a Bisacodyl 10 mg RC Moderate Constipation   Tobacco use -Continue nicotine patch 14 mg TD -Smoking Cessation Counseling given   Discharge Instructions  Discharge Instructions     Call MD for:  difficulty breathing, headache or visual disturbances   Complete by: As directed    Call MD for:  difficulty breathing, headache or visual disturbances   Complete by: As directed    Call MD for:  extreme fatigue   Complete by: As directed    Call MD for:  extreme fatigue   Complete by: As directed    Call MD for:  hives   Complete by: As directed    Call MD for:  hives   Complete by: As directed    Call MD for:  persistant dizziness or light-headedness   Complete by: As directed    Call MD for:  persistant dizziness or light-headedness   Complete by: As directed    Call MD for:  persistant nausea and vomiting   Complete by: As directed    Call MD for:  persistant nausea and vomiting   Complete by: As directed    Call MD for:  redness, tenderness, or signs  of infection (pain, swelling, redness, odor or green/yellow discharge around incision site)   Complete by: As directed    Call MD for:  redness, tenderness, or signs of infection (pain, swelling, redness, odor or green/yellow discharge around incision site)   Complete by: As directed    Call MD for:  severe uncontrolled pain   Complete by: As directed    Call MD for:  severe uncontrolled pain   Complete by: As directed    Call MD for:  temperature >100.4   Complete by: As directed    Call MD for:  temperature >100.4   Complete by: As directed    Diet - low sodium heart healthy   Complete by: As directed    Diet - low sodium heart healthy   Complete by: As directed    Discharge instructions   Complete by: As directed    You were cared for by a hospitalist during your hospital stay. If you have any questions about your discharge medications or the care you received while you were in the hospital after you are discharged, you can call the unit and ask to speak with the hospitalist on call if the hospitalist that took care of you is not available. Once you are discharged, your primary care physician will handle any further medical issues. Please note that NO REFILLS for any discharge medications will be authorized once you are discharged, as it is imperative that you return to your primary care physician (or establish a relationship with a primary care physician if you do not have one) for your aftercare needs so that they can reassess your need for medications and monitor your lab values.  Follow up with PCP and Orthopedic Surgery within 1-2 weeks. Take all medications as prescribed. If symptoms change or worsen please return to the ED for evaluation   Discharge instructions   Complete by: As directed    You were cared for by a hospitalist during your hospital stay. If you have any questions about your discharge medications or the care you received while you were in the hospital after you are  discharged, you can call the unit and ask to speak with the hospitalist on call if the hospitalist that took care of you is not available. Once you are discharged, your primary care physician will handle any further medical issues. Please note that NO REFILLS for any discharge medications will be authorized once you are discharged, as it is imperative that you return to your primary care physician (or establish a relationship with a primary care physician if you do not have one) for your aftercare needs so that they can reassess your need for medications and monitor your lab values.  Follow up with  PCP and Orthopedic Surgery and Cardiology as an outpatient. Take all medications as prescribed. If symptoms change or worsen please return to the ED for evaluation   Discharge wound care:   Complete by: As directed    Per Ortho Reccs   Discharge wound care:   Complete by: As directed    Per Ortho Reinforce Dressing   Increase activity slowly   Complete by: As directed    Increase activity slowly   Complete by: As directed       Allergies as of 02/20/2021       Reactions   Penicillins         Medication List     TAKE these medications    acetaminophen 325 MG tablet Commonly known as: TYLENOL Take 650 mg by mouth daily.   aspirin 81 MG chewable tablet Chew 1 tablet (81 mg total) by mouth 2 (two) times daily for 28 days.   atorvastatin 40 MG tablet Commonly known as: LIPITOR Take 40 mg by mouth at bedtime.   baclofen 10 MG tablet Commonly known as: LIORESAL Take 1 tablet (10 mg total) by mouth 3 (three) times daily.   bisacodyl 10 MG suppository Commonly known as: DULCOLAX Place 1 suppository (10 mg total) rectally daily as needed for moderate constipation.   busPIRone 5 MG tablet Commonly known as: BUSPAR Take 1 tablet (5 mg total) by mouth 2 (two) times daily.   celecoxib 200 MG capsule Commonly known as: CELEBREX Take 1 capsule (200 mg total) by mouth daily.    cetirizine 10 MG tablet Commonly known as: ZYRTEC Take 1 tablet (10 mg total) by mouth daily.   CLEAR EYES FOR DRY EYES OP Place 1 drop into the left eye in the morning and at bedtime.   gabapentin 100 MG capsule Commonly known as: NEURONTIN Take 1 capsule (100 mg total) by mouth at bedtime.   hydrochlorothiazide 25 MG tablet Commonly known as: HYDRODIURIL Take 1 tablet (25 mg total) by mouth daily.   hydrOXYzine 25 MG tablet Commonly known as: ATARAX/VISTARIL Take 1 tablet (25 mg total) by mouth 3 (three) times daily.   ketoconazole 2 % cream Commonly known as: NIZORAL Apply 1 application topically daily.   levETIRAcetam 750 MG tablet Commonly known as: KEPPRA Take 1 tablet (750 mg total) by mouth 2 (two) times daily.   menthol-cetylpyridinium 3 MG lozenge Commonly known as: CEPACOL Take 1 lozenge (3 mg total) by mouth as needed for sore throat.   methocarbamol 500 MG tablet Commonly known as: ROBAXIN Take 1 tablet (500 mg total) by mouth every 6 (six) hours as needed for muscle spasms.   mupirocin ointment 2 % Commonly known as: BACTROBAN Place 1 application into the nose 2 (two) times daily.   nicotine 14 mg/24hr patch Commonly known as: NICODERM CQ - dosed in mg/24 hours Place 1 patch (14 mg total) onto the skin daily.   omeprazole 20 MG capsule Commonly known as: PRILOSEC Take 1 capsule (20 mg total) by mouth daily.   ondansetron 4 MG tablet Commonly known as: ZOFRAN Take 1 tablet (4 mg total) by mouth every 6 (six) hours as needed for nausea.   oxyCODONE 5 MG immediate release tablet Commonly known as: Oxy IR/ROXICODONE Take 1-2 tablets (5-10 mg total) by mouth every 6 (six) hours as needed for severe pain.   polyethylene glycol 17 g packet Commonly known as: MIRALAX / GLYCOLAX Take 17 g by mouth daily as needed for mild constipation.   promethazine  12.5 MG tablet Commonly known as: PHENERGAN Take 1 tablet (12.5 mg total) by mouth every 8 (eight)  hours as needed for nausea or vomiting.   senna-docusate 8.6-50 MG tablet Commonly known as: Senokot-S Take 2 tablets by mouth at bedtime.   tamsulosin 0.4 MG Caps capsule Commonly known as: FLOMAX Take 0.4 mg by mouth in the morning and at bedtime.   traZODone 150 MG tablet Commonly known as: DESYREL Take 1 tablet (150 mg total) by mouth at bedtime.   triamcinolone cream 0.1 % Commonly known as: KENALOG Apply 1 application topically 2 (two) times daily.       ASK your doctor about these medications    docusate sodium 100 MG capsule Commonly known as: COLACE Take 1 capsule (100 mg total) by mouth daily.               Discharge Care Instructions  (From admission, onward)           Start     Ordered   02/20/21 0000  Discharge wound care:       Comments: Per Ortho Reinforce Dressing   02/20/21 1135   02/18/21 0000  Discharge wound care:       Comments: Per Ortho Reccs   02/18/21 1232            Contact information for follow-up providers     Paralee Cancel, MD. Schedule an appointment as soon as possible for a visit in 2 week(s).   Specialty: Orthopedic Surgery Contact information: [address] [place] [address] [phone]         Noreene Larsson, NP Follow up.   Specialty: Nurse Practitioner Why: Follow up within 1-2 weeks Contact information: [address]  [address] [place] [address] [phone]              Contact information for after-discharge care     Destination     [facility] .   Service: Skilled Nursing Contact information: [address] [place] [phone]                    Allergies  Allergen Reactions   Penicillins    Consultations: Orthopedic Surgery  Procedures/Studies: CT Angio Chest Pulmonary Embolism (PE) W or WO Contrast  Result Date: 02/18/2021 CLINICAL DATA:  Chest pain, shortness of breath,  possible fall EXAM: CT ANGIOGRAPHY CHEST WITH CONTRAST TECHNIQUE: Multidetector CT imaging of the chest was performed using the standard protocol during bolus administration of intravenous contrast. Multiplanar CT image reconstructions and MIPs were obtained to evaluate the vascular anatomy. CONTRAST:  24mL OMNIPAQUE IOHEXOL 350 MG/ML SOLN COMPARISON:  Radiograph 02/18/2021 FINDINGS: Cardiovascular: Evaluation is tailored for the assessment of the pulmonary arteries. Satisfactory opacification of the pulmonary arteries to the lobar level with more distal segmental and subsegmental evaluation limited by suboptimal contrast timing. No large central or lobar filling defects are identified central pulmonary arteries are normal caliber. Non tailored evaluation of the aorta. The aortic root is suboptimally assessed given cardiac pulsation artifact. No gross acute luminal abnormality of the aorta is visible. Minimal atherosclerotic plaque within the normal caliber aorta. Shared origin of the brachiocephalic and left common carotid arteries. Minimal plaque in the proximal great vessels. No major venous abnormalities. Mixing artifact in the SVC. Normal heart size. No pericardial effusion. Mediastinum/Nodes: No mediastinal fluid or gas. Normal thyroid gland and thoracic inlet. No acute abnormality of the trachea or esophagus.  No worrisome mediastinal, hilar or axillary adenopathy. Subcentimeter partially calcified node towards the diaphragmatic hiatus. Lungs/Pleura: Numerous calcified nodules throughout both lungs, most pronounced anteriorly and in the upper lobes, likely calcified granulomata as sequela of prior granulomatous disease. Largest nodule seen in the right upper lobe measuring up to 13 mm in size. Background of centrilobular and paraseptal emphysematous change. No acute consolidation. No CT evidence of edema. No other concerning pulmonary nodules or masses. Bandlike opacities in the lung bases, likely atelectasis  and/or scarring. Additional atelectatic changes dependently. Upper Abdomen: No acute abnormalities present in the visualized portions of the upper abdomen. Musculoskeletal: Degenerative changes are present in the imaged spine and shoulders. No acute osseous abnormality or suspicious osseous lesion. No visible displaced rib or sternal fractures. No acute fracture or traumatic osseous injury of the included shoulders. Degenerative changes in the bilateral shoulders with evidence of prior avascular necrosis of the humeral heads. No visible fracture or traumatic listhesis of the thoracic spine as included. Catheter tubing noted at the base of the neck bilaterally extending inferiorly across the right anterior chest wall within the subcutaneous fat. Review of the MIP images confirms the above findings. IMPRESSION: 1. No evidence of pulmonary embolism to the lobar level with more distal evaluation limited by suboptimal contrast timing. 2. No acute traumatic findings in the chest. 3. Numerous scattered calcified nodules throughout both lungs, most pronounced anteriorly and in the upper lobes, as well as small calcified lymph nodes towards the hiatus. Likely reflect sequela of prior granulomatous disease in the chest. 4. 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 Correlate with patient history. Associated degenerative changes in the shoulders. 5. Centrilobular and paraseptal emphysema ( Emphysema (ICD10-J43.9).) 6.  Aortic Atherosclerosis (ICD10-I70.0). 7. Subcutaneous shunt catheter tubing noted at the base of the neck bilaterally and extending across the right anterior chest wall. Electronically Signed   By: Lovena Le M.D.   On: 02/18/2021 16:48   DG CHEST PORT 1 VIEW  Result Date: 02/18/2021 CLINICAL DATA:  50 year old male with shortness of breath and chest tightness. EXAM: PORTABLE CHEST 1 VIEW COMPARISON:  None FINDINGS: Minimal left lung base atelectasis/scarring. No focal consolidation,  pleural effusion, or pneumothorax. There is a 12 mm nodule in the right mid lung field and a 5 mm right upper lung field nodule. Further evaluation with chest CT on a nonemergent/outpatient basis recommended. The cardiac silhouette is within normal limits. No acute osseous pathology. IMPRESSION: 1. No acute cardiopulmonary process. 2. Right lung nodules. Follow-up with chest CT on a nonemergent/outpatient basis recommended. Electronically Signed   By: Anner Crete M.D.   On: 02/18/2021 16:10   DG C-Arm 1-60 Min  Result Date: 02/15/2021 CLINICAL DATA:  Left hip fracture repair. EXAM: DG C-ARM 1-60 MIN FLUOROSCOPY TIME:  Fluoroscopy Time:  1 minutes 41 seconds Radiation Exposure Index (if provided by the fluoroscopic device): 31.89 mGy Number of Acquired Spot Images: 2 COMPARISON:  February 14, 2021 FINDINGS: A gamma nail and intramedullary rod have been placed across the intertrochanteric fracture with improved alignment. A distal interlocking screw is noted. IMPRESSION: Left hip fracture repair as above. Electronically Signed   By: Dorise Bullion III M.D   On: 02/15/2021 13:06   ECHOCARDIOGRAM COMPLETE  Result Date: 02/19/2021    ECHOCARDIOGRAM REPORT   Patient Name:   Roger Morton Date of Exam: 02/19/2021 Medical Rec #:  [id number]        Height:       74.0 in Accession #:  [id number]       Weight:       229.0 lb Date of Birth:  [birth year]       BSA:          2.302 m Patient Age:    50 years         BP:           137/97 mmHg Patient Gender: M                HR:           91 bpm. Exam Location:  Inpatient Procedure: 2D Echo, Cardiac Doppler and Color Doppler Indications:    Dyspnea                 Chest pain  History:        Patient has no prior history of Echocardiogram examinations.                 Risk Factors:Hypertension and Dyslipidemia. GERD.  Sonographer:    Clayton Lefort RDCS (AE) Referring Phys: [id number] Georgina Quint LATIF [place]  1. Left ventricular ejection fraction, by estimation, is 60  to 65%. The left ventricle has normal function. The left ventricle has no regional wall motion abnormalities. There is mild concentric left ventricular hypertrophy. Left ventricular diastolic parameters were normal.  2. Right ventricular systolic function is normal. The right ventricular size is normal. Tricuspid regurgitation signal is inadequate for assessing PA pressure.  3. The mitral valve is grossly normal. No evidence of mitral valve regurgitation. No evidence of mitral stenosis.  4. The aortic valve is tricuspid. Aortic valve regurgitation is not visualized. No aortic stenosis is present.  5. The inferior vena cava is normal in size with greater than 50% respiratory variability, suggesting right atrial pressure of 3 mmHg. Conclusion(s)/Recommendation(s): Normal biventricular function without evidence of hemodynamically significant valvular heart disease. FINDINGS  Left Ventricle: Left ventricular ejection fraction, by estimation, is 60 to 65%. The left ventricle has normal function. The left ventricle has no regional wall motion abnormalities. Definity contrast agent was given IV to delineate the left ventricular  endocardial borders. The left ventricular internal cavity size was normal in size. There is mild concentric left ventricular hypertrophy. Left ventricular diastolic parameters were normal. Right Ventricle: The right ventricular size is normal. No increase in right ventricular wall thickness. Right ventricular systolic function is normal. Tricuspid regurgitation signal is inadequate for assessing PA pressure. Left Atrium: Left atrial size was normal in size. Right Atrium: Right atrial size was normal in size. Pericardium: Trivial pericardial effusion is present. Mitral Valve: The mitral valve is grossly normal. No evidence of mitral valve regurgitation. No evidence of mitral valve stenosis. MV peak gradient, 3.9 mmHg. The mean mitral valve gradient is 2.0 mmHg. Tricuspid Valve: The tricuspid valve  is grossly normal. Tricuspid valve regurgitation is trivial. No evidence of tricuspid stenosis. Aortic Valve: The aortic valve is tricuspid. Aortic valve regurgitation is not visualized. No aortic stenosis is present. Aortic valve mean gradient measures 3.0 mmHg. Aortic valve peak gradient measures 4.9 mmHg. Aortic valve area, by VTI measures 2.71 cm. Pulmonic Valve: The pulmonic valve was grossly normal. Pulmonic valve regurgitation is not visualized. No evidence of pulmonic stenosis. Aorta: The aortic root and ascending aorta are structurally normal, with no evidence of dilitation. Venous: The inferior vena cava is normal in size with greater than 50% respiratory variability, suggesting right atrial pressure of 3 mmHg. IAS/Shunts: The atrial septum is grossly normal.  LEFT  VENTRICLE PLAX 2D LVIDd:         4.20 cm  Diastology LVIDs:         2.70 cm  LV e' medial:    7.62 cm/s LV PW:         1.30 cm  LV E/e' medial:  8.5 LV IVS:        1.30 cm  LV e' lateral:   9.79 cm/s LVOT diam:     1.90 cm  LV E/e' lateral: 6.6 LV SV:         50 LV SV Index:   22 LVOT Area:     2.84 cm  RIGHT VENTRICLE             IVC RV Basal diam:  2.90 cm     IVC diam: 1.90 cm RV S prime:     19.40 cm/s TAPSE (M-mode): 2.0 cm LEFT ATRIUM           Index       RIGHT ATRIUM           Index LA diam:      2.00 cm 0.87 cm/m  RA Area:     16.30 cm LA Vol (A2C): 43.2 ml 18.76 ml/m RA Volume:   41.80 ml  18.16 ml/m LA Vol (A4C): 24.7 ml 10.73 ml/m  AORTIC VALVE AV Area (Vmax):    3.01 cm AV Area (Vmean):   2.95 cm AV Area (VTI):     2.71 cm AV Vmax:           111.00 cm/s AV Vmean:          74.100 cm/s AV VTI:            0.183 m AV Peak Grad:      4.9 mmHg AV Mean Grad:      3.0 mmHg LVOT Vmax:         118.00 cm/s LVOT Vmean:        77.000 cm/s LVOT VTI:          0.175 m LVOT/AV VTI ratio: 0.96  AORTA Ao Root diam: 3.58 cm Ao Asc diam:  3.70 cm MITRAL VALVE MV Area (PHT): 2.97 cm    SHUNTS MV Area VTI:   2.29 cm    Systemic VTI:  0.18 m MV  Peak grad:  3.9 mmHg    Systemic Diam: 1.90 cm MV Mean grad:  2.0 mmHg MV Vmax:       0.99 m/s MV Vmean:      67.3 cm/s MV Decel Time: 255 msec MV E velocity: 65.00 cm/s MV A velocity: 78.10 cm/s MV E/A ratio:  0.83 Eleonore Chiquito MD Electronically signed by Eleonore Chiquito MD Signature Date/Time: 02/19/2021/4:47:49 PM    Final    DG HIP OPERATIVE UNILAT WITH PELVIS LEFT  Result Date: 02/15/2021 CLINICAL DATA:  Left hip fracture repair. EXAM: DG C-ARM 1-60 MIN FLUOROSCOPY TIME:  Fluoroscopy Time:  1 minutes 41 seconds Radiation Exposure Index (if provided by the fluoroscopic device): 31.89 mGy Number of Acquired Spot Images: 2 COMPARISON:  February 14, 2021 FINDINGS: A gamma nail and intramedullary rod have been placed across the intertrochanteric fracture with improved alignment. A distal interlocking screw is noted. IMPRESSION: Left hip fracture repair as above. Electronically Signed   By: Dorise Bullion III M.D   On: 02/15/2021 13:06   DG Hip Unilat W or Wo Pelvis 2-3 Views Left  Result Date: 02/15/2021 CLINICAL DATA:  Status post fall. EXAM: DG  HIP (WITH OR WITHOUT PELVIS) 2-3V LEFT COMPARISON:  None. FINDINGS: An acute fracture deformity is seen extending through the inter trochanteric region of the proximal left femur. There is no evidence of dislocation. Moderate severity degenerative changes are seen involving both hips in the form of joint space narrowing and acetabular sclerosis. IMPRESSION: Acute fracture of the proximal left femur. Electronically Signed   By: Virgina Norfolk M.D.   On: 02/15/2021 00:32   DG Knee AP/LAT W/Sunrise Left  Result Date: 02/10/2021 Formatting of this result is different from the original. X-rays left knee were obtained in clinic today and demonstrates no acute injuries.  Well-maintained joint spaces.  Flexion contractures evident on the x-rays.  Calcified matrix in the distal femur.   Impression: Normal left knee x-ray.  Minimal degenerative changes.    IMPRESSIONS      1. Left ventricular ejection fraction, by estimation, is 60 to 65%. The  left ventricle has normal function. The left ventricle has no regional  wall motion abnormalities. There is mild concentric left ventricular  hypertrophy. Left ventricular diastolic  parameters were normal.   2. Right ventricular systolic function is normal. The right ventricular  size is normal. Tricuspid regurgitation signal is inadequate for assessing  PA pressure.   3. The mitral valve is grossly normal. No evidence of mitral valve  regurgitation. No evidence of mitral stenosis.   4. The aortic valve is tricuspid. Aortic valve regurgitation is not  visualized. No aortic stenosis is present.   5. The inferior vena cava is normal in size with greater than 50%  respiratory variability, suggesting right atrial pressure of 3 mmHg.   Conclusion(s)/Recommendation(s): Normal biventricular function without  evidence of hemodynamically significant valvular heart disease.   FINDINGS   Left Ventricle: Left ventricular ejection fraction, by estimation, is 60  to 65%. The left ventricle has normal function. The left ventricle has no  regional wall motion abnormalities. Definity contrast agent was given IV  to delineate the left ventricular   endocardial borders. The left ventricular internal cavity size was normal  in size. There is mild concentric left ventricular hypertrophy. Left  ventricular diastolic parameters were normal.   Right Ventricle: The right ventricular size is normal. No increase in  right ventricular wall thickness. Right ventricular systolic function is  normal. Tricuspid regurgitation signal is inadequate for assessing PA  pressure.   Left Atrium: Left atrial size was normal in size.   Right Atrium: Right atrial size was normal in size.   Pericardium: Trivial pericardial effusion is present.   Mitral Valve: The mitral valve is grossly normal. No evidence of mitral  valve regurgitation. No  evidence of mitral valve stenosis. MV peak  gradient, 3.9 mmHg. The mean mitral valve gradient is 2.0 mmHg.   Tricuspid Valve: The tricuspid valve is grossly normal. Tricuspid valve  regurgitation is trivial. No evidence of tricuspid stenosis.   Aortic Valve: The aortic valve is tricuspid. Aortic valve regurgitation is  not visualized. No aortic stenosis is present. Aortic valve mean gradient  measures 3.0 mmHg. Aortic valve peak gradient measures 4.9 mmHg. Aortic  valve area, by VTI measures 2.71  cm.   Pulmonic Valve: The pulmonic valve was grossly normal. Pulmonic valve  regurgitation is not visualized. No evidence of pulmonic stenosis.   Aorta: The aortic root and ascending aorta are structurally normal, with  no evidence of dilitation.   Venous: The inferior vena cava is normal in size with greater than 50%  respiratory variability, suggesting  right atrial pressure of 3 mmHg.   IAS/Shunts: The atrial septum is grossly normal.      LEFT VENTRICLE  PLAX 2D  LVIDd:         4.20 cm  Diastology  LVIDs:         2.70 cm  LV e' medial:    7.62 cm/s  LV PW:         1.30 cm  LV E/e' medial:  8.5  LV IVS:        1.30 cm  LV e' lateral:   9.79 cm/s  LVOT diam:     1.90 cm  LV E/e' lateral: 6.6  LV SV:         50  LV SV Index:   22  LVOT Area:     2.84 cm      RIGHT VENTRICLE             IVC  RV Basal diam:  2.90 cm     IVC diam: 1.90 cm  RV S prime:     19.40 cm/s  TAPSE (M-mode): 2.0 cm   LEFT ATRIUM           Index       RIGHT ATRIUM           Index  LA diam:      2.00 cm 0.87 cm/m  RA Area:     16.30 cm  LA Vol (A2C): 43.2 ml 18.76 ml/m RA Volume:   41.80 ml  18.16 ml/m  LA Vol (A4C): 24.7 ml 10.73 ml/m   AORTIC VALVE  AV Area (Vmax):    3.01 cm  AV Area (Vmean):   2.95 cm  AV Area (VTI):     2.71 cm  AV Vmax:           111.00 cm/s  AV Vmean:          74.100 cm/s  AV VTI:            0.183 m  AV Peak Grad:      4.9 mmHg  AV Mean Grad:      3.0 mmHg  LVOT  Vmax:         118.00 cm/s  LVOT Vmean:        77.000 cm/s  LVOT VTI:          0.175 m  LVOT/AV VTI ratio: 0.96     AORTA  Ao Root diam: 3.58 cm  Ao Asc diam:  3.70 cm   MITRAL VALVE  MV Area (PHT): 2.97 cm    SHUNTS  MV Area VTI:   2.29 cm    Systemic VTI:  0.18 m  MV Peak grad:  3.9 mmHg    Systemic Diam: 1.90 cm  MV Mean grad:  2.0 mmHg  MV Vmax:       0.99 m/s  MV Vmean:      67.3 cm/s  MV Decel Time: 255 msec  MV E velocity: 65.00 cm/s  MV A velocity: 78.10 cm/s  MV E/A ratio:  0.83   Subjective: Seen and examined at bedside and is feeling good. Had some pain. No nausea or vomiting. No Chest Pain. States the Mupirocin stops up his nose. No other concerns or complaints at this time.   Discharge Exam: Vitals:   02/19/21 1951 02/20/21 0334  BP: (!) 134/98 140/87  Pulse: 96 93  Resp: 19 18  Temp: 99.1 F (37.3 C) 98.2 F (36.8 C)  SpO2: 94% 94%   Vitals:   02/19/21 0755 02/19/21 1351 02/19/21 1951 02/20/21 0334  BP: 133/89 (!) 137/97 (!) 134/98 140/87  Pulse: 88 91 96 93  Resp: 16 14 19 18   Temp: 98.5 F (36.9 C) 98.2 F (36.8 C) 99.1 F (37.3 C) 98.2 F (36.8 C)  TempSrc: Oral Oral Oral Oral  SpO2: 96% 95% 94% 94%  Weight:      Height:       General: Pt is alert, awake, not in acute distress Cardiovascular: RRR, S1/S2 +, no rubs, no gallops Respiratory: Diminished to auscultation  Abdominal: Soft, NT, Distended 2/2 secondary body habitus, bowel sounds + Extremities: no edema, no cyanosis  The results of significant diagnostics from this hospitalization (including imaging, microbiology, ancillary and laboratory) are listed below for reference.    Microbiology: Recent Results (from the past 240 hour(s))  Resp Panel by RT-PCR (Flu A&B, Covid) Nasopharyngeal Swab     Status: None   Collection Time: 02/14/21 11:15 PM   Specimen: Nasopharyngeal Swab; Nasopharyngeal(NP) swabs in vial transport medium  Result Value Ref Range Status   SARS Coronavirus 2 by RT  PCR NEGATIVE NEGATIVE Final    Comment: (NOTE) SARS-CoV-2 target nucleic acids are NOT DETECTED.  The SARS-CoV-2 RNA is generally detectable in upper respiratory specimens during the acute phase of infection. The lowest concentration of SARS-CoV-2 viral copies this assay can detect is 138 copies/mL. A negative result does not preclude SARS-Cov-2 infection and should not be used as the sole basis for treatment or other patient management decisions. A negative result may occur with  improper specimen collection/handling, submission of specimen other than nasopharyngeal swab, presence of viral mutation(s) within the areas targeted by this assay, and inadequate number of viral copies(<138 copies/mL). A negative result must be combined with clinical observations, patient history, and epidemiological information. The expected result is Negative.  Fact Sheet for Patients:  EntrepreneurPulse.com.au  Fact Sheet for Healthcare Providers:  IncredibleEmployment.be  This test is no t yet approved or cleared by the [place] FDA and  has been authorized for detection and/or diagnosis of SARS-CoV-2 by FDA under an Emergency Use Authorization (EUA). This EUA will remain  in effect (meaning this test can be used) for the duration of the COVID-19 declaration under Section 564(b)(1) of the Act, 21 U.S.C.section 360bbb-3(b)(1), unless the authorization is terminated  or revoked sooner.       Influenza A by PCR NEGATIVE NEGATIVE Final   Influenza B by PCR NEGATIVE NEGATIVE Final    Comment: (NOTE) The Xpert Xpress SARS-CoV-2/FLU/RSV plus assay is intended as an aid in the diagnosis of influenza from Nasopharyngeal swab specimens and should not be used as a sole basis for treatment. Nasal washings and aspirates are unacceptable for Xpert Xpress SARS-CoV-2/FLU/RSV testing.  Fact Sheet for Patients: EntrepreneurPulse.com.au  Fact Sheet for  Healthcare Providers: IncredibleEmployment.be  This test is not yet approved or cleared by the [place] FDA and has been authorized for detection and/or diagnosis of SARS-CoV-2 by FDA under an Emergency Use Authorization (EUA). This EUA will remain in effect (meaning this test can be used) for the duration of the COVID-19 declaration under Section 564(b)(1) of the Act, 21 U.S.C. section 360bbb-3(b)(1), unless the authorization is terminated or revoked.  Performed at [facility], [place] [address] [place], [place] [address]   Surgical PCR screen     Status: Abnormal   Collection Time: 02/15/21  4:34 AM   Specimen: Nasal Mucosa; Nasal Swab  Result Value Ref Range Status   MRSA, PCR POSITIVE (A) NEGATIVE Final    Comment: RESULT CALLED TO, READ BACK BY AND VERIFIED WITH: A,DAVY RN @1155  02/15/21 EB    Staphylococcus aureus POSITIVE (A) NEGATIVE Final    Comment: (NOTE) The Xpert SA Assay (FDA approved for NASAL specimens in patients 47 years of age and older), is one component of a comprehensive surveillance program. It is not intended to diagnose infection nor to guide or monitor treatment. Performed at [facility], [place] [address], [place], [place] [address]   Resp Panel by RT-PCR (Flu A&B, Covid) Nasopharyngeal Swab     Status: None   Collection Time: 02/18/21 11:55 AM   Specimen: Nasopharyngeal Swab; Nasopharyngeal(NP) swabs in vial transport medium  Result Value Ref Range Status   SARS Coronavirus 2 by RT PCR NEGATIVE NEGATIVE Final    Comment: (NOTE) SARS-CoV-2 target nucleic acids are NOT DETECTED.  The SARS-CoV-2 RNA is generally detectable in upper respiratory specimens during the acute phase of infection. The lowest concentration of SARS-CoV-2 viral copies this assay can detect is 138 copies/mL. A negative result does not preclude SARS-Cov-2 infection and should not be used as the sole basis for treatment or other patient management  decisions. A negative result may occur with  improper specimen collection/handling, submission of specimen other than nasopharyngeal swab, presence of viral mutation(s) within the areas targeted by this assay, and inadequate number of viral copies(<138 copies/mL). A negative result must be combined with clinical observations, patient history, and epidemiological information. The expected result is Negative.  Fact Sheet for Patients:  EntrepreneurPulse.com.au  Fact Sheet for Healthcare Providers:  IncredibleEmployment.be  This test is no t yet approved or cleared by the [place] FDA and  has been authorized for detection and/or diagnosis of SARS-CoV-2 by FDA under an Emergency Use Authorization (EUA). This EUA will remain  in effect (meaning this test can be used) for the duration of the COVID-19 declaration under Section 564(b)(1) of the Act, 21 U.S.C.section 360bbb-3(b)(1), unless the authorization is terminated  or revoked sooner.       Influenza A by PCR NEGATIVE NEGATIVE Final   Influenza B by PCR NEGATIVE NEGATIVE Final    Comment: (NOTE) The Xpert Xpress SARS-CoV-2/FLU/RSV plus assay is intended as an aid in the diagnosis of influenza from Nasopharyngeal swab specimens and should not be used as a sole basis for treatment. Nasal washings and aspirates are unacceptable for Xpert Xpress SARS-CoV-2/FLU/RSV testing.  Fact Sheet for Patients: EntrepreneurPulse.com.au  Fact Sheet for Healthcare Providers: IncredibleEmployment.be  This test is not yet approved or cleared by the [place] FDA and has been authorized for detection and/or diagnosis of SARS-CoV-2 by FDA under an Emergency Use Authorization (EUA). This EUA will remain in effect (meaning this test can be used) for the duration of the COVID-19 declaration under Section 564(b)(1) of the Act, 21 U.S.C. section 360bbb-3(b)(1), unless the  authorization is terminated or revoked.  Performed at [facility], [place] [address] [place], [place] [address]     Labs: BNP (last 3 results) No results for input(s): BNP in the last 8760 hours. Basic Metabolic Panel: Recent Labs  Lab 02/15/21 0501 02/16/21 0145 02/17/21 0307 02/18/21 0908 02/19/21 0118 02/20/21 0209  NA 138 138 140 138 137 138  K 3.5 3.7 3.5 3.3* 4.0 3.5  CL 104 101 102 103 104 104  CO2 24 27 29 27 23 25   GLUCOSE 140* 122* 83 117* 102* 97  BUN 15 11 15 9 11 12   CREATININE 0.64 0.69 0.70 0.57* 0.61 0.57*  CALCIUM 9.0 8.9 8.6* 8.6* 8.8* 8.6*  MG 1.7  --   --  1.9 2.1 2.1  PHOS 2.9  --   --  3.3 3.5 4.1   Liver Function Tests: Recent Labs  Lab 02/14/21 2314 02/18/21 0908 02/19/21 0118 02/20/21 0209  AST 18 22 20 18   ALT 14 15 15 14   ALKPHOS 70 65 60 59  BILITOT 0.5 0.6 0.9 0.9  PROT 6.4* 6.5 6.0* 5.9*  ALBUMIN 3.6 3.2* 3.1* 3.0*   No results for input(s): LIPASE, AMYLASE in the last 168 hours. No results for input(s): AMMONIA in the last 168 hours. CBC: Recent Labs  Lab 02/14/21 2314 02/15/21 0501 02/16/21 0145 02/17/21 0508 02/18/21 0908 02/19/21 0118 02/20/21 0209  WBC 13.7*   < > 13.3* 9.7 10.9* 9.1 9.7  NEUTROABS 11.2*  --   --   --  8.6*  --  6.1  HGB 14.0   < > 12.7* 12.0* 12.7* 12.3* 11.9*  HCT 41.5   < > 37.6* 35.8* 37.4* 36.5* 36.0*  MCV 85.7   < > 85.5 86.3 84.4 85.3 86.1  PLT 253   < > 252 226 260 242 270   < > = values in this interval not displayed.   Cardiac Enzymes: No results for input(s): CKTOTAL, CKMB, CKMBINDEX, TROPONINI in the last 168 hours. BNP: Invalid input(s): POCBNP CBG: Recent Labs  Lab 02/18/21 1440  GLUCAP 156*   D-Dimer Recent Labs    02/18/21 1532  DDIMER 1.37*   Hgb A1c No results for input(s): HGBA1C in the last 72 hours. Lipid Profile No results for input(s): CHOL, HDL, LDLCALC, TRIG, CHOLHDL, LDLDIRECT in the last 72 hours. Thyroid function studies Recent Labs     02/19/21 0118  TSH 1.796   Anemia work up No results for input(s): VITAMINB12, FOLATE, FERRITIN, TIBC, IRON, RETICCTPCT in the last 72 hours. Urinalysis No results found for: COLORURINE, APPEARANCEUR, [place], [place], GLUCOSEU, [place], [place], [place], PROTEINUR, UROBILINOGEN, NITRITE, LEUKOCYTESUR Sepsis Labs Invalid input(s): PROCALCITONIN,  WBC,  LACTICIDVEN Microbiology Recent Results (from the past 240 hour(s))  Resp Panel by RT-PCR (Flu A&B, Covid) Nasopharyngeal Swab     Status: None   Collection Time: 02/14/21 11:15 PM   Specimen: Nasopharyngeal Swab; Nasopharyngeal(NP) swabs in vial transport medium  Result Value Ref Range Status   SARS Coronavirus 2 by RT PCR NEGATIVE NEGATIVE Final    Comment: (NOTE) SARS-CoV-2 target nucleic acids are NOT DETECTED.  The SARS-CoV-2 RNA is generally detectable in upper respiratory specimens during the acute phase of infection. The lowest concentration of SARS-CoV-2 viral copies this assay can detect is 138 copies/mL. A negative result does not preclude SARS-Cov-2 infection and should not be used as the sole basis for treatment or other patient management decisions. A negative result may occur with  improper specimen collection/handling, submission of specimen other than nasopharyngeal swab, presence of viral mutation(s) within the areas targeted by this assay, and inadequate number of viral copies(<138 copies/mL). A negative result must be combined with clinical observations, patient history, and epidemiological information. The expected result is Negative.  Fact Sheet for Patients:  EntrepreneurPulse.com.au  Fact Sheet for Healthcare Providers:  IncredibleEmployment.be  This test is no t yet approved or cleared by the [place] FDA and  has been authorized for detection and/or diagnosis of SARS-CoV-2 by FDA under an Emergency Use Authorization (EUA). This EUA will remain  in  effect  (meaning this test can be used) for the duration of the COVID-19 declaration under Section 564(b)(1) of the Act, 21 U.S.C.section 360bbb-3(b)(1), unless the authorization is terminated  or revoked sooner.       Influenza A by PCR NEGATIVE NEGATIVE Final   Influenza B by PCR NEGATIVE NEGATIVE Final    Comment: (NOTE) The Xpert Xpress SARS-CoV-2/FLU/RSV plus assay is intended as an aid in the diagnosis of influenza from Nasopharyngeal swab specimens and should not be used as a sole basis for treatment. Nasal washings and aspirates are unacceptable for Xpert Xpress SARS-CoV-2/FLU/RSV testing.  Fact Sheet for Patients: EntrepreneurPulse.com.au  Fact Sheet for Healthcare Providers: IncredibleEmployment.be  This test is not yet approved or cleared by the [place] FDA and has been authorized for detection and/or diagnosis of SARS-CoV-2 by FDA under an Emergency Use Authorization (EUA). This EUA will remain in effect (meaning this test can be used) for the duration of the COVID-19 declaration under Section 564(b)(1) of the Act, 21 U.S.C. section 360bbb-3(b)(1), unless the authorization is terminated or revoked.  Performed at [facility], [place] [address] [place], [place] [address]   Surgical PCR screen     Status: Abnormal   Collection Time: 02/15/21  4:34 AM   Specimen: Nasal Mucosa; Nasal Swab  Result Value Ref Range Status   MRSA, PCR POSITIVE (A) NEGATIVE Final    Comment: RESULT CALLED TO, READ BACK BY AND VERIFIED WITH: A,DAVY RN @1155  02/15/21 EB    Staphylococcus aureus POSITIVE (A) NEGATIVE Final    Comment: (NOTE) The Xpert SA Assay (FDA approved for NASAL specimens in patients 51 years of age and older), is one component of a comprehensive surveillance program. It is not intended to diagnose infection nor to guide or monitor treatment. Performed at [facility], [place] [address] [place], [place] [address]   Resp  Panel by RT-PCR (Flu A&B, Covid) Nasopharyngeal Swab     Status: None   Collection Time: 02/18/21 11:55 AM   Specimen: Nasopharyngeal Swab; Nasopharyngeal(NP) swabs in vial transport medium  Result Value Ref Range Status   SARS Coronavirus 2 by RT PCR NEGATIVE NEGATIVE Final    Comment: (NOTE) SARS-CoV-2 target nucleic acids are NOT DETECTED.  The SARS-CoV-2 RNA is generally detectable in upper respiratory specimens during the acute phase of infection. The lowest concentration of SARS-CoV-2 viral copies this assay can detect is 138 copies/mL. A negative result does not preclude SARS-Cov-2 infection and should not be used as the sole basis for treatment or other patient management decisions. A negative result may occur with  improper specimen collection/handling, submission of specimen other than nasopharyngeal swab, presence of viral mutation(s) within the areas targeted by this assay, and inadequate number of viral copies(<138 copies/mL). A negative result must be combined with clinical observations, patient history, and epidemiological information. The expected result is Negative.  Fact Sheet for Patients:  EntrepreneurPulse.com.au  Fact Sheet for Healthcare Providers:  IncredibleEmployment.be  This test is no t yet approved or cleared by the [place] FDA and  has been authorized for detection and/or diagnosis of SARS-CoV-2 by FDA under an Emergency Use Authorization (EUA). This EUA will remain  in effect (meaning this test can be used) for the duration of the COVID-19 declaration under Section 564(b)(1) of the Act, 21 U.S.C.section 360bbb-3(b)(1), unless the authorization is terminated  or revoked sooner.       Influenza A by PCR NEGATIVE NEGATIVE Final   Influenza B by PCR  NEGATIVE NEGATIVE Final    Comment: (NOTE) The Xpert Xpress SARS-CoV-2/FLU/RSV plus assay is intended as an aid in the diagnosis of influenza from Nasopharyngeal  swab specimens and should not be used as a sole basis for treatment. Nasal washings and aspirates are unacceptable for Xpert Xpress SARS-CoV-2/FLU/RSV testing.  Fact Sheet for Patients: EntrepreneurPulse.com.au  Fact Sheet for Healthcare Providers: IncredibleEmployment.be  This test is not yet approved or cleared by the [place] FDA and has been authorized for detection and/or diagnosis of SARS-CoV-2 by FDA under an Emergency Use Authorization (EUA). This EUA will remain in effect (meaning this test can be used) for the duration of the COVID-19 declaration under Section 564(b)(1) of the Act, 21 U.S.C. section 360bbb-3(b)(1), unless the authorization is terminated or revoked.  Performed at [facility], [place] [address] [place], [place] [address]    Time coordinating discharge: 35 minutes  SIGNED:  Kerney Elbe, DO [facility] 02/20/2021, 11:53 AM Pager is on [place]  If 7PM-7AM, please contact night-coverage [email or web address]

## 2021-02-19 NOTE — Progress Notes (Signed)
Physical Therapy Treatment Patient Details Name: Roger Morton MRN: [id number] DOB: 01/03/1971 Today's Date: 02/19/2021    History of Present Illness Pt is a 50 y.o. male admitted 02/14/21 after fall at home sustaining L hip fx. S/p L intertrochanteric IM nail 6/26. Pt with c/o chest tightness and SOB 6/29 with transfer to med-tele 6E; suspect atypical chest pain, now resolved. PMH includes CVA, seizure disorder, neuropathy, HTN.   PT Comments    Pt progressing with mobility. Today's session focused on transfer training and LLE ROM. Pt initially states, "I'll just tell you right now, I can't put any weight on that leg... I can't stand." Educ pt re: LLE precautions, positioning, transfer options, importance of ROM/mobility. Pt responds well to encouragement and able to perform multiple bouts of transfer training, including lateral scooting and standing with RW, requiring up to modA+2 for standing. Continue to recommend SNF-level therapies to maximize functional mobility and independence prior to return home. Will follow acutely to address established goals.   Follow Up Recommendations  SNF;Supervision for mobility/OOB     Equipment Recommendations  Wheelchair (measurements PT);Wheelchair cushion (measurements PT);Hospital bed    Recommendations for Other Services       Precautions / Restrictions Precautions Precautions: Fall;Other (comment) Precaution Comments: Residual L-side weakness from stroke Restrictions Weight Bearing Restrictions: Yes LLE Weight Bearing: Partial weight bearing LLE Partial Weight Bearing Percentage or Pounds: 50%    Mobility  Bed Mobility Overal bed mobility: Needs Assistance Bed Mobility: Supine to Sit     Supine to sit: Supervision;HOB elevated     General bed mobility comments: Pt able to come to long sitting from elevated HOB with bed rail, cues to use BUEs to assist LLE to EOB, able to do so without assist    Transfers Overall transfer level:  Needs assistance Equipment used: None;Rolling walker (2 wheeled) Transfers: Lateral/Scoot Transfers;Sit to/from Stand Sit to Stand: Min guard         General transfer comment: Lateral scoot from EOB to drop-arm recliner towards R-side, min guard for balance, cues for sequencing. Performed 2x sit<>stand trials from recliner to RW, pt with very poor tolerance to accept WB onto LLE, cues for sequencing with heavy reliance on BUE/RLE support, modA+2 to assist trunk elevation and maintain stability when transitioning RUE support to RW; difficulty achieving full hip extension due to pain, very minimal WB through LLE  Ambulation/Gait             General Gait Details: unable this session secondary to pain   Stairs             Wheelchair Mobility    Modified Rankin (Stroke Patients Only)       Balance Overall balance assessment: Needs assistance Sitting-balance support: Feet supported;No upper extremity supported Sitting balance-Leahy Scale: Fair     Standing balance support: Bilateral upper extremity supported Standing balance-Leahy Scale: Poor Standing balance comment: Reliant on BUE support and external assist; dependent for posterior pericare                            Cognition Arousal/Alertness: Awake/alert Behavior During Therapy: WFL for tasks assessed/performed;Anxious Overall Cognitive Status: History of cognitive impairments - at baseline                                 General Comments: Pt with baseline h/o strokes, brain tumor resection, and recent  concussion. Pt follows majority of simple, one-step commands, requires some repetition of cues and commands; pt repeating some conversation. Pt responds well to motivation/encouragement and humor      Exercises General Exercises - Lower Extremity Long Arc Quad: AAROM;Left;Seated Heel Slides: AAROM;Left;Seated Straight Leg Raises: PROM;Left (small range)    General Comments General  comments (skin integrity, edema, etc.): RN aware pt needing new dressings on neck wound      Pertinent Vitals/Pain Pain Assessment: Faces Faces Pain Scale: Hurts whole lot Pain Location: L thigh Pain Descriptors / Indicators: Grimacing;Guarding;Moaning Pain Intervention(s): Limited activity within patient's tolerance;Monitored during session;Repositioned;Premedicated before session    Home Living                      Prior Function            PT Goals (current goals can now be found in the care plan section) Progress towards PT goals: Progressing toward goals    Frequency    Min 3X/week      PT Plan Current plan remains appropriate    Co-evaluation              AM-PAC PT "6 Clicks" Mobility   Outcome Measure  Help needed turning from your back to your side while in a flat bed without using bedrails?: A Little Help needed moving from lying on your back to sitting on the side of a flat bed without using bedrails?: A Little Help needed moving to and from a bed to a chair (including a wheelchair)?: A Lot Help needed standing up from a chair using your arms (e.g., wheelchair or bedside chair)?: A Lot Help needed to walk in hospital room?: A Lot Help needed climbing 3-5 steps with a railing? : Total 6 Click Score: 13    End of Session Equipment Utilized During Treatment: Gait belt Activity Tolerance: Patient tolerated treatment well;Patient limited by pain Patient left: in chair;with call bell/phone within reach;with chair alarm set Nurse Communication: Mobility status;Precautions;Weight bearing status PT Visit Diagnosis: Muscle weakness (generalized) (M62.81);Difficulty in walking, not elsewhere classified (R26.2);Pain Pain - Right/Left: Left Pain - part of body: Hip;Leg     Time: 3532-9924 PT Time Calculation (min) (ACUTE ONLY): 33 min  Charges:  $Therapeutic Activity: 8-22 mins                     Mabeline Caras, PT, DPT Acute Rehabilitation  Services  Pager [phone] [facility] [place] 02/19/2021, 3:43 PM

## 2021-02-19 NOTE — Progress Notes (Signed)
  Echocardiogram 2D Echocardiogram has been performed.  Roger Morton 02/19/2021, 3:04 PM

## 2021-02-20 DIAGNOSIS — E785 Hyperlipidemia, unspecified: Secondary | ICD-10-CM | POA: Diagnosis not present

## 2021-02-20 DIAGNOSIS — R0981 Nasal congestion: Secondary | ICD-10-CM | POA: Diagnosis not present

## 2021-02-20 DIAGNOSIS — S72142D Displaced intertrochanteric fracture of left femur, subsequent encounter for closed fracture with routine healing: Secondary | ICD-10-CM | POA: Diagnosis not present

## 2021-02-20 DIAGNOSIS — G40909 Epilepsy, unspecified, not intractable, without status epilepticus: Secondary | ICD-10-CM | POA: Diagnosis not present

## 2021-02-20 DIAGNOSIS — Z743 Need for continuous supervision: Secondary | ICD-10-CM | POA: Diagnosis not present

## 2021-02-20 DIAGNOSIS — R300 Dysuria: Secondary | ICD-10-CM | POA: Diagnosis not present

## 2021-02-20 DIAGNOSIS — J302 Other seasonal allergic rhinitis: Secondary | ICD-10-CM | POA: Diagnosis not present

## 2021-02-20 DIAGNOSIS — Z720 Tobacco use: Secondary | ICD-10-CM | POA: Diagnosis not present

## 2021-02-20 DIAGNOSIS — S79929A Unspecified injury of unspecified thigh, initial encounter: Secondary | ICD-10-CM | POA: Diagnosis not present

## 2021-02-20 DIAGNOSIS — J3489 Other specified disorders of nose and nasal sinuses: Secondary | ICD-10-CM | POA: Diagnosis not present

## 2021-02-20 DIAGNOSIS — I69392 Facial weakness following cerebral infarction: Secondary | ICD-10-CM | POA: Diagnosis not present

## 2021-02-20 DIAGNOSIS — M25562 Pain in left knee: Secondary | ICD-10-CM | POA: Diagnosis not present

## 2021-02-20 DIAGNOSIS — S7292XA Unspecified fracture of left femur, initial encounter for closed fracture: Secondary | ICD-10-CM | POA: Diagnosis not present

## 2021-02-20 DIAGNOSIS — R262 Difficulty in walking, not elsewhere classified: Secondary | ICD-10-CM | POA: Diagnosis not present

## 2021-02-20 DIAGNOSIS — K5903 Drug induced constipation: Secondary | ICD-10-CM | POA: Diagnosis not present

## 2021-02-20 DIAGNOSIS — M6281 Muscle weakness (generalized): Secondary | ICD-10-CM | POA: Diagnosis not present

## 2021-02-20 DIAGNOSIS — M79605 Pain in left leg: Secondary | ICD-10-CM | POA: Diagnosis not present

## 2021-02-20 DIAGNOSIS — G629 Polyneuropathy, unspecified: Secondary | ICD-10-CM | POA: Diagnosis not present

## 2021-02-20 DIAGNOSIS — G8929 Other chronic pain: Secondary | ICD-10-CM | POA: Diagnosis not present

## 2021-02-20 DIAGNOSIS — Z7401 Bed confinement status: Secondary | ICD-10-CM | POA: Diagnosis not present

## 2021-02-20 DIAGNOSIS — Z4789 Encounter for other orthopedic aftercare: Secondary | ICD-10-CM | POA: Diagnosis not present

## 2021-02-20 DIAGNOSIS — S72302D Unspecified fracture of shaft of left femur, subsequent encounter for closed fracture with routine healing: Secondary | ICD-10-CM | POA: Diagnosis not present

## 2021-02-20 DIAGNOSIS — S72142A Displaced intertrochanteric fracture of left femur, initial encounter for closed fracture: Secondary | ICD-10-CM | POA: Diagnosis not present

## 2021-02-20 DIAGNOSIS — I10 Essential (primary) hypertension: Secondary | ICD-10-CM | POA: Diagnosis not present

## 2021-02-20 DIAGNOSIS — M6240 Contracture of muscle, unspecified site: Secondary | ICD-10-CM | POA: Diagnosis not present

## 2021-02-20 DIAGNOSIS — G479 Sleep disorder, unspecified: Secondary | ICD-10-CM | POA: Diagnosis not present

## 2021-02-20 DIAGNOSIS — M62838 Other muscle spasm: Secondary | ICD-10-CM | POA: Diagnosis not present

## 2021-02-20 DIAGNOSIS — N390 Urinary tract infection, site not specified: Secondary | ICD-10-CM | POA: Diagnosis not present

## 2021-02-20 DIAGNOSIS — E876 Hypokalemia: Secondary | ICD-10-CM | POA: Diagnosis not present

## 2021-02-20 DIAGNOSIS — D72829 Elevated white blood cell count, unspecified: Secondary | ICD-10-CM | POA: Diagnosis not present

## 2021-02-20 DIAGNOSIS — S1091XA Abrasion of unspecified part of neck, initial encounter: Secondary | ICD-10-CM | POA: Diagnosis not present

## 2021-02-20 DIAGNOSIS — M62461 Contracture of muscle, right lower leg: Secondary | ICD-10-CM | POA: Diagnosis not present

## 2021-02-20 DIAGNOSIS — D649 Anemia, unspecified: Secondary | ICD-10-CM | POA: Diagnosis not present

## 2021-02-20 DIAGNOSIS — M25552 Pain in left hip: Secondary | ICD-10-CM | POA: Diagnosis not present

## 2021-02-20 DIAGNOSIS — K219 Gastro-esophageal reflux disease without esophagitis: Secondary | ICD-10-CM | POA: Diagnosis not present

## 2021-02-20 DIAGNOSIS — M62462 Contracture of muscle, left lower leg: Secondary | ICD-10-CM | POA: Diagnosis not present

## 2021-02-20 DIAGNOSIS — R339 Retention of urine, unspecified: Secondary | ICD-10-CM | POA: Diagnosis not present

## 2021-02-20 DIAGNOSIS — G4700 Insomnia, unspecified: Secondary | ICD-10-CM | POA: Diagnosis not present

## 2021-02-20 DIAGNOSIS — I69354 Hemiplegia and hemiparesis following cerebral infarction affecting left non-dominant side: Secondary | ICD-10-CM | POA: Diagnosis not present

## 2021-02-20 DIAGNOSIS — F17210 Nicotine dependence, cigarettes, uncomplicated: Secondary | ICD-10-CM | POA: Diagnosis not present

## 2021-02-20 DIAGNOSIS — E46 Unspecified protein-calorie malnutrition: Secondary | ICD-10-CM | POA: Diagnosis not present

## 2021-02-20 LAB — PHOSPHORUS: Phosphorus: 4.1 mg/dL (ref 2.5–4.6)

## 2021-02-20 LAB — CBC WITH DIFFERENTIAL/PLATELET
Abs Immature Granulocytes: 0.08 10*3/uL — ABNORMAL HIGH (ref 0.00–0.07)
Basophils Absolute: 0 10*3/uL (ref 0.0–0.1)
Basophils Relative: 0 %
Eosinophils Absolute: 0.2 10*3/uL (ref 0.0–0.5)
Eosinophils Relative: 2 %
HCT: 36 % — ABNORMAL LOW (ref 39.0–52.0)
Hemoglobin: 11.9 g/dL — ABNORMAL LOW (ref 13.0–17.0)
Immature Granulocytes: 1 %
Lymphocytes Relative: 26 %
Lymphs Abs: 2.5 10*3/uL (ref 0.7–4.0)
MCH: 28.5 pg (ref 26.0–34.0)
MCHC: 33.1 g/dL (ref 30.0–36.0)
MCV: 86.1 fL (ref 80.0–100.0)
Monocytes Absolute: 0.8 10*3/uL (ref 0.1–1.0)
Monocytes Relative: 9 %
Neutro Abs: 6.1 10*3/uL (ref 1.7–7.7)
Neutrophils Relative %: 62 %
Platelets: 270 10*3/uL (ref 150–400)
RBC: 4.18 MIL/uL — ABNORMAL LOW (ref 4.22–5.81)
RDW: 13.9 % (ref 11.5–15.5)
WBC: 9.7 10*3/uL (ref 4.0–10.5)
nRBC: 0 % (ref 0.0–0.2)

## 2021-02-20 LAB — COMPREHENSIVE METABOLIC PANEL WITH GFR
ALT: 14 U/L (ref 0–44)
AST: 18 U/L (ref 15–41)
Albumin: 3 g/dL — ABNORMAL LOW (ref 3.5–5.0)
Alkaline Phosphatase: 59 U/L (ref 38–126)
Anion gap: 9 (ref 5–15)
BUN: 12 mg/dL (ref 6–20)
CO2: 25 mmol/L (ref 22–32)
Calcium: 8.6 mg/dL — ABNORMAL LOW (ref 8.9–10.3)
Chloride: 104 mmol/L (ref 98–111)
Creatinine, Ser: 0.57 mg/dL — ABNORMAL LOW (ref 0.61–1.24)
GFR, Estimated: >60 mL/min
Glucose, Bld: 97 mg/dL (ref 70–99)
Potassium: 3.5 mmol/L (ref 3.5–5.1)
Sodium: 138 mmol/L (ref 135–145)
Total Bilirubin: 0.9 mg/dL (ref 0.3–1.2)
Total Protein: 5.9 g/dL — ABNORMAL LOW (ref 6.5–8.1)

## 2021-02-20 LAB — MAGNESIUM: Magnesium: 2.1 mg/dL (ref 1.7–2.4)

## 2021-02-20 MED ORDER — POTASSIUM CHLORIDE CRYS ER 20 MEQ PO TBCR
40.0000 meq | EXTENDED_RELEASE_TABLET | Freq: Two times a day (BID) | ORAL | Status: DC
  Administered 2021-02-20: 40 meq via ORAL
  Filled 2021-02-20: qty 2

## 2021-02-20 NOTE — Progress Notes (Signed)
Attempted to give report, was transferred over to receiving station , no one  picking  up/answering phone   noted .

## 2021-02-20 NOTE — TOC Transition Note (Signed)
Transition of Care [facility]) - CM/SW Discharge Note   Patient Details  Name: Roger Morton MRN: [id number] Date of Birth: [birth year]  [facility] CM/SW Contact:  Coralee Pesa, [place] Phone Number: 02/20/2021, 11:07 AM   Clinical Narrative:    Pt to be transported via [facility] to [facility]. Nurse to call report to [phone] Rm 130   Final next level of care: [place] Barriers to Discharge: No Barriers Identified   Patient Goals and CMS Choice        Discharge Placement              Patient chooses bed at: [facility] Patient to be transferred to facility by: [place] Name of family member notified: Timoth, Schara (Brother)   [phone] Patient and family notified of of transfer: 02/18/21  Discharge Plan and Services                                     Social Determinants of Health (SDOH) Interventions     Readmission Risk Interventions No flowsheet data found.

## 2021-02-20 NOTE — Plan of Care (Signed)
  Problem: Activity: Goal: Ability to avoid complications of mobility impairment will improve Outcome: Completed/Met Goal: Ability to tolerate increased activity will improve Outcome: Completed/Met   Problem: Education: Goal: Verbalization of understanding the information provided will improve Outcome: Completed/Met   Problem: Coping: Goal: Level of anxiety will decrease Outcome: Completed/Met   Problem: Physical Regulation: Goal: Postoperative complications will be avoided or minimized Outcome: Completed/Met   Problem: Respiratory: Goal: Ability to maintain a clear airway will improve Outcome: Completed/Met   Problem: Pain Management: Goal: Pain level will decrease Outcome: Completed/Met   Problem: Skin Integrity: Goal: Signs of wound healing will improve Outcome: Completed/Met   Problem: Tissue Perfusion: Goal: Ability to maintain adequate tissue perfusion will improve Outcome: Completed/Met

## 2021-02-25 ENCOUNTER — Ambulatory Visit (HOSPITAL_COMMUNITY): Payer: Medicare Other | Admitting: Physical Therapy

## 2021-02-25 DIAGNOSIS — S72302D Unspecified fracture of shaft of left femur, subsequent encounter for closed fracture with routine healing: Secondary | ICD-10-CM | POA: Diagnosis not present

## 2021-02-25 DIAGNOSIS — S1091XA Abrasion of unspecified part of neck, initial encounter: Secondary | ICD-10-CM | POA: Diagnosis not present

## 2021-02-25 DIAGNOSIS — R0981 Nasal congestion: Secondary | ICD-10-CM | POA: Diagnosis not present

## 2021-02-25 DIAGNOSIS — J302 Other seasonal allergic rhinitis: Secondary | ICD-10-CM | POA: Diagnosis not present

## 2021-02-25 DIAGNOSIS — K5903 Drug induced constipation: Secondary | ICD-10-CM | POA: Diagnosis not present

## 2021-02-25 DIAGNOSIS — M79605 Pain in left leg: Secondary | ICD-10-CM | POA: Diagnosis not present

## 2021-02-25 DIAGNOSIS — M62838 Other muscle spasm: Secondary | ICD-10-CM | POA: Diagnosis not present

## 2021-02-26 DIAGNOSIS — S72302D Unspecified fracture of shaft of left femur, subsequent encounter for closed fracture with routine healing: Secondary | ICD-10-CM | POA: Diagnosis not present

## 2021-02-26 DIAGNOSIS — K219 Gastro-esophageal reflux disease without esophagitis: Secondary | ICD-10-CM | POA: Diagnosis not present

## 2021-02-26 DIAGNOSIS — E785 Hyperlipidemia, unspecified: Secondary | ICD-10-CM | POA: Diagnosis not present

## 2021-03-02 DIAGNOSIS — J302 Other seasonal allergic rhinitis: Secondary | ICD-10-CM | POA: Diagnosis not present

## 2021-03-02 DIAGNOSIS — M25552 Pain in left hip: Secondary | ICD-10-CM | POA: Diagnosis not present

## 2021-03-02 DIAGNOSIS — K5903 Drug induced constipation: Secondary | ICD-10-CM | POA: Diagnosis not present

## 2021-03-02 DIAGNOSIS — R0981 Nasal congestion: Secondary | ICD-10-CM | POA: Diagnosis not present

## 2021-03-02 DIAGNOSIS — G4700 Insomnia, unspecified: Secondary | ICD-10-CM | POA: Diagnosis not present

## 2021-03-02 DIAGNOSIS — S72302D Unspecified fracture of shaft of left femur, subsequent encounter for closed fracture with routine healing: Secondary | ICD-10-CM | POA: Diagnosis not present

## 2021-03-03 DIAGNOSIS — F17210 Nicotine dependence, cigarettes, uncomplicated: Secondary | ICD-10-CM | POA: Diagnosis not present

## 2021-03-03 DIAGNOSIS — G4700 Insomnia, unspecified: Secondary | ICD-10-CM | POA: Diagnosis not present

## 2021-03-03 DIAGNOSIS — J302 Other seasonal allergic rhinitis: Secondary | ICD-10-CM | POA: Diagnosis not present

## 2021-03-05 DIAGNOSIS — S72142A Displaced intertrochanteric fracture of left femur, initial encounter for closed fracture: Secondary | ICD-10-CM | POA: Diagnosis not present

## 2021-03-09 DIAGNOSIS — M25552 Pain in left hip: Secondary | ICD-10-CM | POA: Diagnosis not present

## 2021-03-09 DIAGNOSIS — R262 Difficulty in walking, not elsewhere classified: Secondary | ICD-10-CM | POA: Diagnosis not present

## 2021-03-09 DIAGNOSIS — R0981 Nasal congestion: Secondary | ICD-10-CM | POA: Diagnosis not present

## 2021-03-09 DIAGNOSIS — M79605 Pain in left leg: Secondary | ICD-10-CM | POA: Diagnosis not present

## 2021-03-09 DIAGNOSIS — J302 Other seasonal allergic rhinitis: Secondary | ICD-10-CM | POA: Diagnosis not present

## 2021-03-09 DIAGNOSIS — I69354 Hemiplegia and hemiparesis following cerebral infarction affecting left non-dominant side: Secondary | ICD-10-CM | POA: Diagnosis not present

## 2021-03-09 DIAGNOSIS — M62461 Contracture of muscle, right lower leg: Secondary | ICD-10-CM | POA: Diagnosis not present

## 2021-03-09 DIAGNOSIS — S72302D Unspecified fracture of shaft of left femur, subsequent encounter for closed fracture with routine healing: Secondary | ICD-10-CM | POA: Diagnosis not present

## 2021-03-13 DIAGNOSIS — M25552 Pain in left hip: Secondary | ICD-10-CM | POA: Diagnosis not present

## 2021-03-13 DIAGNOSIS — Z4789 Encounter for other orthopedic aftercare: Secondary | ICD-10-CM | POA: Diagnosis not present

## 2021-03-13 DIAGNOSIS — S72142D Displaced intertrochanteric fracture of left femur, subsequent encounter for closed fracture with routine healing: Secondary | ICD-10-CM | POA: Diagnosis not present

## 2021-03-16 DIAGNOSIS — R339 Retention of urine, unspecified: Secondary | ICD-10-CM | POA: Diagnosis not present

## 2021-03-16 DIAGNOSIS — M25562 Pain in left knee: Secondary | ICD-10-CM | POA: Diagnosis not present

## 2021-03-16 DIAGNOSIS — R300 Dysuria: Secondary | ICD-10-CM | POA: Diagnosis not present

## 2021-03-16 DIAGNOSIS — M25552 Pain in left hip: Secondary | ICD-10-CM | POA: Diagnosis not present

## 2021-03-16 DIAGNOSIS — N390 Urinary tract infection, site not specified: Secondary | ICD-10-CM | POA: Diagnosis not present

## 2021-03-16 DIAGNOSIS — S72302D Unspecified fracture of shaft of left femur, subsequent encounter for closed fracture with routine healing: Secondary | ICD-10-CM | POA: Diagnosis not present

## 2021-03-17 ENCOUNTER — Other Ambulatory Visit: Payer: Self-pay | Admitting: Orthopedic Surgery

## 2021-03-17 DIAGNOSIS — R262 Difficulty in walking, not elsewhere classified: Secondary | ICD-10-CM | POA: Diagnosis not present

## 2021-03-17 DIAGNOSIS — M6281 Muscle weakness (generalized): Secondary | ICD-10-CM | POA: Diagnosis not present

## 2021-03-17 DIAGNOSIS — M25552 Pain in left hip: Secondary | ICD-10-CM

## 2021-03-17 DIAGNOSIS — S72302D Unspecified fracture of shaft of left femur, subsequent encounter for closed fracture with routine healing: Secondary | ICD-10-CM | POA: Diagnosis not present

## 2021-03-17 DIAGNOSIS — Z4789 Encounter for other orthopedic aftercare: Secondary | ICD-10-CM | POA: Diagnosis not present

## 2021-03-18 DIAGNOSIS — Z4789 Encounter for other orthopedic aftercare: Secondary | ICD-10-CM | POA: Diagnosis not present

## 2021-03-18 DIAGNOSIS — R262 Difficulty in walking, not elsewhere classified: Secondary | ICD-10-CM | POA: Diagnosis not present

## 2021-03-18 DIAGNOSIS — M6281 Muscle weakness (generalized): Secondary | ICD-10-CM | POA: Diagnosis not present

## 2021-03-18 DIAGNOSIS — S72302D Unspecified fracture of shaft of left femur, subsequent encounter for closed fracture with routine healing: Secondary | ICD-10-CM | POA: Diagnosis not present

## 2021-03-19 DIAGNOSIS — S72302D Unspecified fracture of shaft of left femur, subsequent encounter for closed fracture with routine healing: Secondary | ICD-10-CM | POA: Diagnosis not present

## 2021-03-19 DIAGNOSIS — M6281 Muscle weakness (generalized): Secondary | ICD-10-CM | POA: Diagnosis not present

## 2021-03-19 DIAGNOSIS — R262 Difficulty in walking, not elsewhere classified: Secondary | ICD-10-CM | POA: Diagnosis not present

## 2021-03-19 DIAGNOSIS — Z4789 Encounter for other orthopedic aftercare: Secondary | ICD-10-CM | POA: Diagnosis not present

## 2021-03-20 DIAGNOSIS — R262 Difficulty in walking, not elsewhere classified: Secondary | ICD-10-CM | POA: Diagnosis not present

## 2021-03-20 DIAGNOSIS — M25552 Pain in left hip: Secondary | ICD-10-CM | POA: Diagnosis not present

## 2021-03-20 DIAGNOSIS — M6281 Muscle weakness (generalized): Secondary | ICD-10-CM | POA: Diagnosis not present

## 2021-03-20 DIAGNOSIS — F17210 Nicotine dependence, cigarettes, uncomplicated: Secondary | ICD-10-CM | POA: Diagnosis not present

## 2021-03-20 DIAGNOSIS — K5903 Drug induced constipation: Secondary | ICD-10-CM | POA: Diagnosis not present

## 2021-03-20 DIAGNOSIS — M25562 Pain in left knee: Secondary | ICD-10-CM | POA: Diagnosis not present

## 2021-03-20 DIAGNOSIS — S72302D Unspecified fracture of shaft of left femur, subsequent encounter for closed fracture with routine healing: Secondary | ICD-10-CM | POA: Diagnosis not present

## 2021-03-20 DIAGNOSIS — Z4789 Encounter for other orthopedic aftercare: Secondary | ICD-10-CM | POA: Diagnosis not present

## 2021-03-21 DIAGNOSIS — M6281 Muscle weakness (generalized): Secondary | ICD-10-CM | POA: Diagnosis not present

## 2021-03-21 DIAGNOSIS — S72302D Unspecified fracture of shaft of left femur, subsequent encounter for closed fracture with routine healing: Secondary | ICD-10-CM | POA: Diagnosis not present

## 2021-03-21 DIAGNOSIS — Z4789 Encounter for other orthopedic aftercare: Secondary | ICD-10-CM | POA: Diagnosis not present

## 2021-03-21 DIAGNOSIS — R262 Difficulty in walking, not elsewhere classified: Secondary | ICD-10-CM | POA: Diagnosis not present

## 2021-03-22 DIAGNOSIS — Z4789 Encounter for other orthopedic aftercare: Secondary | ICD-10-CM | POA: Diagnosis not present

## 2021-03-22 DIAGNOSIS — R262 Difficulty in walking, not elsewhere classified: Secondary | ICD-10-CM | POA: Diagnosis not present

## 2021-03-22 DIAGNOSIS — M6281 Muscle weakness (generalized): Secondary | ICD-10-CM | POA: Diagnosis not present

## 2021-03-22 DIAGNOSIS — S72302D Unspecified fracture of shaft of left femur, subsequent encounter for closed fracture with routine healing: Secondary | ICD-10-CM | POA: Diagnosis not present

## 2021-03-23 DIAGNOSIS — M6281 Muscle weakness (generalized): Secondary | ICD-10-CM | POA: Diagnosis not present

## 2021-03-23 DIAGNOSIS — R2689 Other abnormalities of gait and mobility: Secondary | ICD-10-CM | POA: Diagnosis not present

## 2021-03-23 DIAGNOSIS — S72302D Unspecified fracture of shaft of left femur, subsequent encounter for closed fracture with routine healing: Secondary | ICD-10-CM | POA: Diagnosis not present

## 2021-03-24 DIAGNOSIS — M6281 Muscle weakness (generalized): Secondary | ICD-10-CM | POA: Diagnosis not present

## 2021-03-24 DIAGNOSIS — R2689 Other abnormalities of gait and mobility: Secondary | ICD-10-CM | POA: Diagnosis not present

## 2021-03-24 DIAGNOSIS — S72302D Unspecified fracture of shaft of left femur, subsequent encounter for closed fracture with routine healing: Secondary | ICD-10-CM | POA: Diagnosis not present

## 2021-03-25 DIAGNOSIS — R2689 Other abnormalities of gait and mobility: Secondary | ICD-10-CM | POA: Diagnosis not present

## 2021-03-25 DIAGNOSIS — S72302D Unspecified fracture of shaft of left femur, subsequent encounter for closed fracture with routine healing: Secondary | ICD-10-CM | POA: Diagnosis not present

## 2021-03-25 DIAGNOSIS — M6281 Muscle weakness (generalized): Secondary | ICD-10-CM | POA: Diagnosis not present

## 2021-03-26 DIAGNOSIS — S72302D Unspecified fracture of shaft of left femur, subsequent encounter for closed fracture with routine healing: Secondary | ICD-10-CM | POA: Diagnosis not present

## 2021-03-26 DIAGNOSIS — R2689 Other abnormalities of gait and mobility: Secondary | ICD-10-CM | POA: Diagnosis not present

## 2021-03-26 DIAGNOSIS — M6281 Muscle weakness (generalized): Secondary | ICD-10-CM | POA: Diagnosis not present

## 2021-03-27 DIAGNOSIS — S72302D Unspecified fracture of shaft of left femur, subsequent encounter for closed fracture with routine healing: Secondary | ICD-10-CM | POA: Diagnosis not present

## 2021-03-27 DIAGNOSIS — R2689 Other abnormalities of gait and mobility: Secondary | ICD-10-CM | POA: Diagnosis not present

## 2021-03-27 DIAGNOSIS — M6281 Muscle weakness (generalized): Secondary | ICD-10-CM | POA: Diagnosis not present

## 2021-03-29 DIAGNOSIS — R2689 Other abnormalities of gait and mobility: Secondary | ICD-10-CM | POA: Diagnosis not present

## 2021-03-29 DIAGNOSIS — M6281 Muscle weakness (generalized): Secondary | ICD-10-CM | POA: Diagnosis not present

## 2021-03-29 DIAGNOSIS — S72302D Unspecified fracture of shaft of left femur, subsequent encounter for closed fracture with routine healing: Secondary | ICD-10-CM | POA: Diagnosis not present

## 2021-03-30 DIAGNOSIS — M6281 Muscle weakness (generalized): Secondary | ICD-10-CM | POA: Diagnosis not present

## 2021-03-30 DIAGNOSIS — R2689 Other abnormalities of gait and mobility: Secondary | ICD-10-CM | POA: Diagnosis not present

## 2021-03-30 DIAGNOSIS — S72302D Unspecified fracture of shaft of left femur, subsequent encounter for closed fracture with routine healing: Secondary | ICD-10-CM | POA: Diagnosis not present

## 2021-03-31 DIAGNOSIS — M6281 Muscle weakness (generalized): Secondary | ICD-10-CM | POA: Diagnosis not present

## 2021-03-31 DIAGNOSIS — R2689 Other abnormalities of gait and mobility: Secondary | ICD-10-CM | POA: Diagnosis not present

## 2021-03-31 DIAGNOSIS — S72302D Unspecified fracture of shaft of left femur, subsequent encounter for closed fracture with routine healing: Secondary | ICD-10-CM | POA: Diagnosis not present

## 2021-04-01 DIAGNOSIS — M6281 Muscle weakness (generalized): Secondary | ICD-10-CM | POA: Diagnosis not present

## 2021-04-01 DIAGNOSIS — S72302D Unspecified fracture of shaft of left femur, subsequent encounter for closed fracture with routine healing: Secondary | ICD-10-CM | POA: Diagnosis not present

## 2021-04-01 DIAGNOSIS — R2689 Other abnormalities of gait and mobility: Secondary | ICD-10-CM | POA: Diagnosis not present

## 2021-04-02 ENCOUNTER — Other Ambulatory Visit: Payer: Medicare Other

## 2021-04-02 DIAGNOSIS — M6281 Muscle weakness (generalized): Secondary | ICD-10-CM | POA: Diagnosis not present

## 2021-04-02 DIAGNOSIS — S72302D Unspecified fracture of shaft of left femur, subsequent encounter for closed fracture with routine healing: Secondary | ICD-10-CM | POA: Diagnosis not present

## 2021-04-02 DIAGNOSIS — R2689 Other abnormalities of gait and mobility: Secondary | ICD-10-CM | POA: Diagnosis not present

## 2021-04-03 DIAGNOSIS — S72302D Unspecified fracture of shaft of left femur, subsequent encounter for closed fracture with routine healing: Secondary | ICD-10-CM | POA: Diagnosis not present

## 2021-04-03 DIAGNOSIS — M6281 Muscle weakness (generalized): Secondary | ICD-10-CM | POA: Diagnosis not present

## 2021-04-03 DIAGNOSIS — R2689 Other abnormalities of gait and mobility: Secondary | ICD-10-CM | POA: Diagnosis not present

## 2021-04-05 DIAGNOSIS — R2689 Other abnormalities of gait and mobility: Secondary | ICD-10-CM | POA: Diagnosis not present

## 2021-04-05 DIAGNOSIS — M6281 Muscle weakness (generalized): Secondary | ICD-10-CM | POA: Diagnosis not present

## 2021-04-05 DIAGNOSIS — S72302D Unspecified fracture of shaft of left femur, subsequent encounter for closed fracture with routine healing: Secondary | ICD-10-CM | POA: Diagnosis not present

## 2021-04-06 DIAGNOSIS — R0981 Nasal congestion: Secondary | ICD-10-CM | POA: Diagnosis not present

## 2021-04-06 DIAGNOSIS — M6281 Muscle weakness (generalized): Secondary | ICD-10-CM | POA: Diagnosis not present

## 2021-04-06 DIAGNOSIS — R339 Retention of urine, unspecified: Secondary | ICD-10-CM | POA: Diagnosis not present

## 2021-04-06 DIAGNOSIS — Z8616 Personal history of COVID-19: Secondary | ICD-10-CM | POA: Diagnosis not present

## 2021-04-06 DIAGNOSIS — R2689 Other abnormalities of gait and mobility: Secondary | ICD-10-CM | POA: Diagnosis not present

## 2021-04-06 DIAGNOSIS — S72302D Unspecified fracture of shaft of left femur, subsequent encounter for closed fracture with routine healing: Secondary | ICD-10-CM | POA: Diagnosis not present

## 2021-04-06 DIAGNOSIS — H04123 Dry eye syndrome of bilateral lacrimal glands: Secondary | ICD-10-CM | POA: Diagnosis not present

## 2021-04-06 DIAGNOSIS — G4700 Insomnia, unspecified: Secondary | ICD-10-CM | POA: Diagnosis not present

## 2021-04-07 DIAGNOSIS — R2689 Other abnormalities of gait and mobility: Secondary | ICD-10-CM | POA: Diagnosis not present

## 2021-04-07 DIAGNOSIS — S72302D Unspecified fracture of shaft of left femur, subsequent encounter for closed fracture with routine healing: Secondary | ICD-10-CM | POA: Diagnosis not present

## 2021-04-07 DIAGNOSIS — M6281 Muscle weakness (generalized): Secondary | ICD-10-CM | POA: Diagnosis not present

## 2021-04-08 ENCOUNTER — Encounter: Payer: Medicare Other | Attending: Physical Medicine & Rehabilitation | Admitting: Physical Medicine & Rehabilitation

## 2021-04-08 DIAGNOSIS — M6281 Muscle weakness (generalized): Secondary | ICD-10-CM | POA: Diagnosis not present

## 2021-04-08 DIAGNOSIS — S72302D Unspecified fracture of shaft of left femur, subsequent encounter for closed fracture with routine healing: Secondary | ICD-10-CM | POA: Diagnosis not present

## 2021-04-08 DIAGNOSIS — R0981 Nasal congestion: Secondary | ICD-10-CM | POA: Diagnosis not present

## 2021-04-08 DIAGNOSIS — R2689 Other abnormalities of gait and mobility: Secondary | ICD-10-CM | POA: Diagnosis not present

## 2021-04-08 DIAGNOSIS — J302 Other seasonal allergic rhinitis: Secondary | ICD-10-CM | POA: Diagnosis not present

## 2021-04-09 DIAGNOSIS — I69354 Hemiplegia and hemiparesis following cerebral infarction affecting left non-dominant side: Secondary | ICD-10-CM | POA: Diagnosis not present

## 2021-04-09 DIAGNOSIS — R2689 Other abnormalities of gait and mobility: Secondary | ICD-10-CM | POA: Diagnosis not present

## 2021-04-09 DIAGNOSIS — R0981 Nasal congestion: Secondary | ICD-10-CM | POA: Diagnosis not present

## 2021-04-09 DIAGNOSIS — R262 Difficulty in walking, not elsewhere classified: Secondary | ICD-10-CM | POA: Diagnosis not present

## 2021-04-09 DIAGNOSIS — S72302D Unspecified fracture of shaft of left femur, subsequent encounter for closed fracture with routine healing: Secondary | ICD-10-CM | POA: Diagnosis not present

## 2021-04-09 DIAGNOSIS — M62461 Contracture of muscle, right lower leg: Secondary | ICD-10-CM | POA: Diagnosis not present

## 2021-04-09 DIAGNOSIS — M6281 Muscle weakness (generalized): Secondary | ICD-10-CM | POA: Diagnosis not present

## 2021-04-10 DIAGNOSIS — M6281 Muscle weakness (generalized): Secondary | ICD-10-CM | POA: Diagnosis not present

## 2021-04-10 DIAGNOSIS — R2689 Other abnormalities of gait and mobility: Secondary | ICD-10-CM | POA: Diagnosis not present

## 2021-04-10 DIAGNOSIS — S72302D Unspecified fracture of shaft of left femur, subsequent encounter for closed fracture with routine healing: Secondary | ICD-10-CM | POA: Diagnosis not present

## 2021-04-12 DIAGNOSIS — M6281 Muscle weakness (generalized): Secondary | ICD-10-CM | POA: Diagnosis not present

## 2021-04-12 DIAGNOSIS — R2689 Other abnormalities of gait and mobility: Secondary | ICD-10-CM | POA: Diagnosis not present

## 2021-04-12 DIAGNOSIS — S72302D Unspecified fracture of shaft of left femur, subsequent encounter for closed fracture with routine healing: Secondary | ICD-10-CM | POA: Diagnosis not present

## 2021-04-13 DIAGNOSIS — R2689 Other abnormalities of gait and mobility: Secondary | ICD-10-CM | POA: Diagnosis not present

## 2021-04-13 DIAGNOSIS — M6281 Muscle weakness (generalized): Secondary | ICD-10-CM | POA: Diagnosis not present

## 2021-04-13 DIAGNOSIS — S72302D Unspecified fracture of shaft of left femur, subsequent encounter for closed fracture with routine healing: Secondary | ICD-10-CM | POA: Diagnosis not present

## 2021-04-14 DIAGNOSIS — R2689 Other abnormalities of gait and mobility: Secondary | ICD-10-CM | POA: Diagnosis not present

## 2021-04-14 DIAGNOSIS — S72302D Unspecified fracture of shaft of left femur, subsequent encounter for closed fracture with routine healing: Secondary | ICD-10-CM | POA: Diagnosis not present

## 2021-04-14 DIAGNOSIS — M6281 Muscle weakness (generalized): Secondary | ICD-10-CM | POA: Diagnosis not present

## 2021-04-14 DIAGNOSIS — S61305A Unspecified open wound of left ring finger with damage to nail, initial encounter: Secondary | ICD-10-CM | POA: Diagnosis not present

## 2021-04-15 DIAGNOSIS — S72302D Unspecified fracture of shaft of left femur, subsequent encounter for closed fracture with routine healing: Secondary | ICD-10-CM | POA: Diagnosis not present

## 2021-04-15 DIAGNOSIS — R2689 Other abnormalities of gait and mobility: Secondary | ICD-10-CM | POA: Diagnosis not present

## 2021-04-15 DIAGNOSIS — M6281 Muscle weakness (generalized): Secondary | ICD-10-CM | POA: Diagnosis not present

## 2021-04-15 DIAGNOSIS — R0981 Nasal congestion: Secondary | ICD-10-CM | POA: Diagnosis not present

## 2021-04-15 DIAGNOSIS — J302 Other seasonal allergic rhinitis: Secondary | ICD-10-CM | POA: Diagnosis not present

## 2021-04-17 ENCOUNTER — Ambulatory Visit
Admission: RE | Admit: 2021-04-17 | Discharge: 2021-04-17 | Disposition: A | Discharge status: Home / Self Care | Payer: Medicare Other | Source: Ambulatory Visit | Attending: Orthopedic Surgery | Admitting: Orthopedic Surgery

## 2021-04-17 ENCOUNTER — Other Ambulatory Visit: Payer: Self-pay

## 2021-04-17 DIAGNOSIS — M25552 Pain in left hip: Secondary | ICD-10-CM

## 2021-04-17 DIAGNOSIS — S72142D Displaced intertrochanteric fracture of left femur, subsequent encounter for closed fracture with routine healing: Secondary | ICD-10-CM | POA: Diagnosis not present

## 2021-04-17 DIAGNOSIS — M1612 Unilateral primary osteoarthritis, left hip: Secondary | ICD-10-CM | POA: Diagnosis not present

## 2021-04-21 ENCOUNTER — Ambulatory Visit: Payer: Self-pay | Admitting: Diagnostic Neuroimaging

## 2021-04-22 ENCOUNTER — Encounter: Payer: Self-pay | Admitting: Diagnostic Neuroimaging

## 2021-04-23 DIAGNOSIS — S1190XS Unspecified open wound of unspecified part of neck, sequela: Secondary | ICD-10-CM | POA: Diagnosis not present

## 2021-05-06 DIAGNOSIS — Z4789 Encounter for other orthopedic aftercare: Secondary | ICD-10-CM | POA: Diagnosis not present

## 2021-05-06 DIAGNOSIS — J302 Other seasonal allergic rhinitis: Secondary | ICD-10-CM | POA: Diagnosis not present

## 2021-05-06 DIAGNOSIS — R2689 Other abnormalities of gait and mobility: Secondary | ICD-10-CM | POA: Diagnosis not present

## 2021-05-06 DIAGNOSIS — M25552 Pain in left hip: Secondary | ICD-10-CM | POA: Diagnosis not present

## 2021-05-06 DIAGNOSIS — S72302D Unspecified fracture of shaft of left femur, subsequent encounter for closed fracture with routine healing: Secondary | ICD-10-CM | POA: Diagnosis not present

## 2021-05-06 DIAGNOSIS — R0981 Nasal congestion: Secondary | ICD-10-CM | POA: Diagnosis not present

## 2021-05-07 DIAGNOSIS — R2689 Other abnormalities of gait and mobility: Secondary | ICD-10-CM | POA: Diagnosis not present

## 2021-05-07 DIAGNOSIS — S72302D Unspecified fracture of shaft of left femur, subsequent encounter for closed fracture with routine healing: Secondary | ICD-10-CM | POA: Diagnosis not present

## 2021-05-09 DIAGNOSIS — R2689 Other abnormalities of gait and mobility: Secondary | ICD-10-CM | POA: Diagnosis not present

## 2021-05-09 DIAGNOSIS — S72302D Unspecified fracture of shaft of left femur, subsequent encounter for closed fracture with routine healing: Secondary | ICD-10-CM | POA: Diagnosis not present

## 2021-05-10 DIAGNOSIS — M62461 Contracture of muscle, right lower leg: Secondary | ICD-10-CM | POA: Diagnosis not present

## 2021-05-10 DIAGNOSIS — R2689 Other abnormalities of gait and mobility: Secondary | ICD-10-CM | POA: Diagnosis not present

## 2021-05-10 DIAGNOSIS — S72302D Unspecified fracture of shaft of left femur, subsequent encounter for closed fracture with routine healing: Secondary | ICD-10-CM | POA: Diagnosis not present

## 2021-05-10 DIAGNOSIS — R262 Difficulty in walking, not elsewhere classified: Secondary | ICD-10-CM | POA: Diagnosis not present

## 2021-05-10 DIAGNOSIS — I69354 Hemiplegia and hemiparesis following cerebral infarction affecting left non-dominant side: Secondary | ICD-10-CM | POA: Diagnosis not present

## 2021-05-11 DIAGNOSIS — R2689 Other abnormalities of gait and mobility: Secondary | ICD-10-CM | POA: Diagnosis not present

## 2021-05-11 DIAGNOSIS — S72302D Unspecified fracture of shaft of left femur, subsequent encounter for closed fracture with routine healing: Secondary | ICD-10-CM | POA: Diagnosis not present

## 2021-05-12 DIAGNOSIS — R2689 Other abnormalities of gait and mobility: Secondary | ICD-10-CM | POA: Diagnosis not present

## 2021-05-12 DIAGNOSIS — M25552 Pain in left hip: Secondary | ICD-10-CM | POA: Diagnosis not present

## 2021-05-12 DIAGNOSIS — G894 Chronic pain syndrome: Secondary | ICD-10-CM | POA: Diagnosis not present

## 2021-05-12 DIAGNOSIS — S72302D Unspecified fracture of shaft of left femur, subsequent encounter for closed fracture with routine healing: Secondary | ICD-10-CM | POA: Diagnosis not present

## 2021-05-12 DIAGNOSIS — S1190XD Unspecified open wound of unspecified part of neck, subsequent encounter: Secondary | ICD-10-CM | POA: Diagnosis not present

## 2021-05-13 DIAGNOSIS — S72302D Unspecified fracture of shaft of left femur, subsequent encounter for closed fracture with routine healing: Secondary | ICD-10-CM | POA: Diagnosis not present

## 2021-05-13 DIAGNOSIS — R2689 Other abnormalities of gait and mobility: Secondary | ICD-10-CM | POA: Diagnosis not present

## 2021-05-13 NOTE — Progress Notes (Deleted)
Referring Provider: Noreene Larsson, NP Primary Care Physician:  Noreene Larsson, NP Primary Gastroenterologist:  Dr. Abbey Chatters  No chief complaint on file.   HPI:   Roger Morton is a 50 y.o. male presenting today at the request of Noreene Larsson, NP for colon cancer screening.     Past Medical History:  Diagnosis Date   BPH (benign prostatic hyperplasia)    Essential hypertension    GERD (gastroesophageal reflux disease)    Neuropathy    Seizure disorder ([place])    Stroke ([place])    had brain tumor, and had CVAs during operation for his brain tumor    Past Surgical History:  Procedure Laterality Date   BRAIN SURGERY  2013   brain tumor excision   INTRAMEDULLARY (IM) NAIL INTERTROCHANTERIC Left 02/15/2021   Procedure: INTRAMEDULLARY (IM) NAIL INTERTROCHANTRIC;  Surgeon: Paralee Cancel, MD;  Location: [facility];  Service: Orthopedics;  Laterality: Left;   shunt intracranial x2      Current Outpatient Medications  Medication Sig Dispense Refill   acetaminophen (TYLENOL) 325 MG tablet Take 650 mg by mouth daily.     atorvastatin (LIPITOR) 40 MG tablet Take 40 mg by mouth at bedtime.     baclofen (LIORESAL) 10 MG tablet Take 1 tablet (10 mg total) by mouth 3 (three) times daily. 30 each 3   bisacodyl (DULCOLAX) 10 MG suppository Place 1 suppository (10 mg total) rectally daily as needed for moderate constipation. 12 suppository 0   busPIRone (BUSPAR) 5 MG tablet Take 1 tablet (5 mg total) by mouth 2 (two) times daily. 60 tablet 3   Carboxymethylcellul-Glycerin (CLEAR EYES FOR DRY EYES OP) Place 1 drop into the left eye in the morning and at bedtime.     celecoxib (CELEBREX) 200 MG capsule Take 1 capsule (200 mg total) by mouth daily. 30 capsule 3   cetirizine (ZYRTEC) 10 MG tablet Take 1 tablet (10 mg total) by mouth daily. 30 tablet 3   docusate sodium (COLACE) 100 MG capsule Take 1 capsule (100 mg total) by mouth daily. (Patient taking differently: Take 100 mg by mouth 2 (two) times  daily.) 10 capsule 3   gabapentin (NEURONTIN) 100 MG capsule Take 1 capsule (100 mg total) by mouth at bedtime. 30 capsule 3   hydrochlorothiazide (HYDRODIURIL) 25 MG tablet Take 1 tablet (25 mg total) by mouth daily. 30 tablet 3   hydrOXYzine (ATARAX/VISTARIL) 25 MG tablet Take 1 tablet (25 mg total) by mouth 3 (three) times daily. 90 tablet 3   ketoconazole (NIZORAL) 2 % cream Apply 1 application topically daily. 15 g 0   levETIRAcetam (KEPPRA) 750 MG tablet Take 1 tablet (750 mg total) by mouth 2 (two) times daily. 60 tablet 3   menthol-cetylpyridinium (CEPACOL) 3 MG lozenge Take 1 lozenge (3 mg total) by mouth as needed for sore throat. 100 tablet 12   methocarbamol (ROBAXIN) 500 MG tablet Take 1 tablet (500 mg total) by mouth every 6 (six) hours as needed for muscle spasms. 40 tablet 0   mupirocin ointment (BACTROBAN) 2 % Place 1 application into the nose 2 (two) times daily. 22 g 0   nicotine (NICODERM CQ - DOSED IN MG/24 HOURS) 14 mg/24hr patch Place 1 patch (14 mg total) onto the skin daily. 28 patch 0   omeprazole (PRILOSEC) 20 MG capsule Take 1 capsule (20 mg total) by mouth daily. 30 capsule 3   ondansetron (ZOFRAN) 4 MG tablet Take 1 tablet (4 mg total) by  mouth every 6 (six) hours as needed for nausea. 20 tablet 0   oxyCODONE (OXY IR/ROXICODONE) 5 MG immediate release tablet Take 1-2 tablets (5-10 mg total) by mouth every 6 (six) hours as needed for severe pain. 42 tablet 0   polyethylene glycol (MIRALAX / GLYCOLAX) 17 g packet Take 17 g by mouth daily as needed for mild constipation. 14 each 0   promethazine (PHENERGAN) 12.5 MG tablet Take 1 tablet (12.5 mg total) by mouth every 8 (eight) hours as needed for nausea or vomiting. 20 tablet 0   senna-docusate (SENOKOT-S) 8.6-50 MG tablet Take 2 tablets by mouth at bedtime. 60 tablet 0   tamsulosin (FLOMAX) 0.4 MG CAPS capsule Take 0.4 mg by mouth in the morning and at bedtime.     traZODone (DESYREL) 150 MG tablet Take 1 tablet (150 mg  total) by mouth at bedtime. 90 tablet 3   triamcinolone cream (KENALOG) 0.1 % Apply 1 application topically 2 (two) times daily. 30 g 0   No current facility-administered medications for this visit.    Allergies as of 05/14/2021 - Review Complete 04/15/2021  Allergen Reaction Noted   Penicillins  01/14/2021    Family History  Problem Relation Age of Onset   CAD Neg Hx     Social History   Socioeconomic History   Marital status: Single    Spouse name: Not on file   Number of children: 3   Years of education: Not on file   Highest education level: Not on file  Occupational History   Occupation: Disabled  Tobacco Use   Smoking status: Every Day    Packs/day: 1.50    Years: 25.00    Pack years: 37.50    Types: Cigarettes   Smokeless tobacco: Never  Vaping Use   Vaping Use: Some days   Substances: Nicotine  Substance and Sexual Activity   Alcohol use: Not Currently   Drug use: Not Currently   Sexual activity: Not Currently  Other Topics Concern   Not on file  Social History Narrative   2 in [place], the other daughter may be in [place], [place]   Social Determinants of Health   Financial Resource Strain: Low Risk    Difficulty of Paying Living Expenses: Not hard at all  Food Insecurity: No Food Insecurity   Worried About Charity fundraiser in the Last Year: Never true   [place] in the Last Year: Never true  Transportation Needs: No Transportation Needs   Lack of Transportation (Medical): No   Lack of Transportation (Non-Medical): No  Physical Activity: Sufficiently Active   Days of Exercise per Week: 7 days   Minutes of Exercise per Session: 30 min  Stress: No Stress Concern Present   Feeling of Stress : Not at all  Social Connections: Socially Isolated   Frequency of Communication with Friends and Family: Three times a week   Frequency of Social Gatherings with Friends and Family: Three times a week   Attends Religious Services: Never   Active  Member of Clubs or Organizations: No   Attends Archivist Meetings: Never   Marital Status: Never married  Human resources officer Violence: Not At Risk   Fear of Current or Ex-Partner: No   Emotionally Abused: No   Physically Abused: No   Sexually Abused: No    Review of Systems: Gen: Denies any fever, chills, fatigue, weight loss, lack of appetite.  CV: Denies chest pain, heart palpitations, peripheral edema, syncope.  Resp: Denies shortness of breath at rest or with exertion. Denies wheezing or cough.  GI: Denies dysphagia or odynophagia. Denies jaundice, hematemesis, fecal incontinence. GU : Denies urinary burning, urinary frequency, urinary hesitancy MS: Denies joint pain, muscle weakness, cramps, or limitation of movement.  Derm: Denies rash, itching, dry skin Psych: Denies depression, anxiety, memory loss, and confusion Heme: Denies bruising, bleeding, and enlarged lymph nodes.  Physical Exam: There were no vitals taken for this visit. General:   Alert and oriented. Pleasant and cooperative. Well-nourished and well-developed.  Head:  Normocephalic and atraumatic. Eyes:  Without icterus, sclera clear and conjunctiva pink.  Ears:  Normal auditory acuity. Nose:  No deformity, discharge,  or lesions. Mouth:  No deformity or lesions, oral mucosa pink.  Neck:  Supple, without mass or thyromegaly. Lungs:  Clear to auscultation bilaterally. No wheezes, rales, or rhonchi. No distress.  Heart:  S1, S2 present without murmurs appreciated.  Abdomen:  +BS, soft, non-tender and non-distended. No HSM noted. No guarding or rebound. No masses appreciated.  Rectal:  Deferred  Msk:  Symmetrical without gross deformities. Normal posture. Pulses:  Normal pulses noted. Extremities:  Without clubbing or edema. Neurologic:  Alert and  oriented x4;  grossly normal neurologically. Skin:  Intact without significant lesions or rashes. Cervical Nodes:  No significant cervical adenopathy. Psych:   Alert and cooperative. Normal mood and affect.

## 2021-05-14 ENCOUNTER — Ambulatory Visit: Payer: Medicare Other | Admitting: Gastroenterology

## 2021-05-14 ENCOUNTER — Other Ambulatory Visit: Payer: Self-pay

## 2021-05-14 ENCOUNTER — Encounter: Payer: Self-pay | Admitting: Internal Medicine

## 2021-05-14 DIAGNOSIS — G8929 Other chronic pain: Secondary | ICD-10-CM

## 2021-05-14 DIAGNOSIS — S72302D Unspecified fracture of shaft of left femur, subsequent encounter for closed fracture with routine healing: Secondary | ICD-10-CM | POA: Diagnosis not present

## 2021-05-14 DIAGNOSIS — I152 Hypertension secondary to endocrine disorders: Secondary | ICD-10-CM

## 2021-05-14 DIAGNOSIS — R2689 Other abnormalities of gait and mobility: Secondary | ICD-10-CM | POA: Diagnosis not present

## 2021-05-14 DIAGNOSIS — I639 Cerebral infarction, unspecified: Secondary | ICD-10-CM

## 2021-05-14 DIAGNOSIS — G40909 Epilepsy, unspecified, not intractable, without status epilepticus: Secondary | ICD-10-CM

## 2021-05-14 DIAGNOSIS — F419 Anxiety disorder, unspecified: Secondary | ICD-10-CM

## 2021-05-14 MED ORDER — CELECOXIB 200 MG PO CAPS: 200.0000 mg | ORAL_CAPSULE | Freq: Every day | ORAL | 3 refills | Status: DC

## 2021-05-14 MED ORDER — BUSPIRONE HCL 5 MG PO TABS: 5.0000 mg | ORAL_TABLET | Freq: Two times a day (BID) | ORAL | 3 refills | Status: DC

## 2021-05-14 MED ORDER — LEVETIRACETAM 750 MG PO TABS: 750.0000 mg | ORAL_TABLET | Freq: Two times a day (BID) | ORAL | 3 refills | Status: DC

## 2021-05-14 MED ORDER — HYDROXYZINE HCL 25 MG PO TABS: 25.0000 mg | ORAL_TABLET | Freq: Three times a day (TID) | ORAL | 3 refills | Status: DC

## 2021-05-14 MED ORDER — HYDROCHLOROTHIAZIDE 25 MG PO TABS: 25.0000 mg | ORAL_TABLET | Freq: Every day | ORAL | 3 refills | Status: DC

## 2021-05-15 DIAGNOSIS — S1190XD Unspecified open wound of unspecified part of neck, subsequent encounter: Secondary | ICD-10-CM | POA: Diagnosis not present

## 2021-05-15 DIAGNOSIS — G894 Chronic pain syndrome: Secondary | ICD-10-CM | POA: Diagnosis not present

## 2021-05-15 DIAGNOSIS — I10 Essential (primary) hypertension: Secondary | ICD-10-CM | POA: Diagnosis not present

## 2021-05-15 DIAGNOSIS — K219 Gastro-esophageal reflux disease without esophagitis: Secondary | ICD-10-CM | POA: Diagnosis not present

## 2021-05-15 DIAGNOSIS — M25552 Pain in left hip: Secondary | ICD-10-CM | POA: Diagnosis not present

## 2021-05-15 DIAGNOSIS — M6281 Muscle weakness (generalized): Secondary | ICD-10-CM | POA: Diagnosis not present

## 2021-05-15 DIAGNOSIS — Z720 Tobacco use: Secondary | ICD-10-CM | POA: Diagnosis not present

## 2021-05-15 DIAGNOSIS — I69354 Hemiplegia and hemiparesis following cerebral infarction affecting left non-dominant side: Secondary | ICD-10-CM | POA: Diagnosis not present

## 2021-05-17 DIAGNOSIS — R2689 Other abnormalities of gait and mobility: Secondary | ICD-10-CM | POA: Diagnosis not present

## 2021-05-17 DIAGNOSIS — S72302D Unspecified fracture of shaft of left femur, subsequent encounter for closed fracture with routine healing: Secondary | ICD-10-CM | POA: Diagnosis not present

## 2021-05-19 DIAGNOSIS — S72302D Unspecified fracture of shaft of left femur, subsequent encounter for closed fracture with routine healing: Secondary | ICD-10-CM | POA: Diagnosis not present

## 2021-05-19 DIAGNOSIS — R2689 Other abnormalities of gait and mobility: Secondary | ICD-10-CM | POA: Diagnosis not present

## 2021-05-21 DIAGNOSIS — R262 Difficulty in walking, not elsewhere classified: Secondary | ICD-10-CM | POA: Diagnosis not present

## 2021-05-21 DIAGNOSIS — M6281 Muscle weakness (generalized): Secondary | ICD-10-CM | POA: Diagnosis not present

## 2021-05-21 DIAGNOSIS — Z1159 Encounter for screening for other viral diseases: Secondary | ICD-10-CM | POA: Diagnosis not present

## 2021-05-21 DIAGNOSIS — S72302D Unspecified fracture of shaft of left femur, subsequent encounter for closed fracture with routine healing: Secondary | ICD-10-CM | POA: Diagnosis not present

## 2021-05-22 DIAGNOSIS — Z1159 Encounter for screening for other viral diseases: Secondary | ICD-10-CM | POA: Diagnosis not present

## 2021-05-22 DIAGNOSIS — R262 Difficulty in walking, not elsewhere classified: Secondary | ICD-10-CM | POA: Diagnosis not present

## 2021-05-22 DIAGNOSIS — S72302D Unspecified fracture of shaft of left femur, subsequent encounter for closed fracture with routine healing: Secondary | ICD-10-CM | POA: Diagnosis not present

## 2021-05-22 DIAGNOSIS — M6281 Muscle weakness (generalized): Secondary | ICD-10-CM | POA: Diagnosis not present

## 2021-05-25 ENCOUNTER — Ambulatory Visit (INDEPENDENT_AMBULATORY_CARE_PROVIDER_SITE_OTHER): Payer: Medicare Other

## 2021-05-25 ENCOUNTER — Other Ambulatory Visit: Payer: Self-pay

## 2021-05-25 VITALS — Ht 74.0 in | Wt 229.0 lb

## 2021-05-25 DIAGNOSIS — Z0000 Encounter for general adult medical examination without abnormal findings: Secondary | ICD-10-CM

## 2021-05-25 DIAGNOSIS — I639 Cerebral infarction, unspecified: Secondary | ICD-10-CM

## 2021-05-25 DIAGNOSIS — Z1211 Encounter for screening for malignant neoplasm of colon: Secondary | ICD-10-CM

## 2021-05-25 DIAGNOSIS — Z139 Encounter for screening, unspecified: Secondary | ICD-10-CM

## 2021-05-25 NOTE — Progress Notes (Signed)
Subjective:   Roger Morton is a 50 y.o. male who presents for an Initial Medicare Annual Wellness Visit. Virtual Visit via Telephone Note  I connected with  Roger Morton on 05/25/21 at 11:00 AM EDT by telephone and verified that I am speaking with the correct person using two identifiers.  Location: Patient: HOME Provider: [facility] Persons participating in the virtual visit: patient/Nurse Health Advisor   I discussed the limitations, risks, security and privacy concerns of performing an evaluation and management service by telephone and the availability of in person appointments. The patient expressed understanding and agreed to proceed.  Interactive audio and video telecommunications were attempted between this nurse and patient, however failed, due to patient having technical difficulties OR patient did not have access to video capability.  We continued and completed visit with audio only.  Some vital signs may be absent or patient reported.   Chriss Driver, LPN  Review of Systems     Cardiac Risk Factors include: advanced age (>74men, >3 women);dyslipidemia;hypertension;male gender;obesity (BMI >30kg/m2);sedentary lifestyle     Objective:    Today's Vitals   05/25/21 0938  Weight: 229 lb (103.9 kg)  Height: 6\' 2"  (1.88 m)   Body mass index is 29.4 kg/m.  Advanced Directives 05/25/2021 02/15/2021 02/14/2021 01/14/2021  Does Patient Have a Medical Advance Directive? Yes No No No  Type of Advance Directive [place] in Chart? No - copy requested - - -  Would patient like information on creating a medical advance directive? - No - Patient declined - No - Patient declined    Current Medications (verified) Outpatient Encounter Medications as of 05/25/2021  Medication Sig   ACETAMINOPHEN EXTRA STRENGTH 500 MG tablet Take 500 mg by mouth every 8 (eight) hours.   atorvastatin (LIPITOR) 40 MG tablet Take 40  mg by mouth at bedtime.   Baclofen 5 MG TABS SMARTSIG:3 Tablet(s) By Mouth Every 8 Hours PRN   bisacodyl (DULCOLAX) 10 MG suppository Place 1 suppository (10 mg total) rectally daily as needed for moderate constipation.   busPIRone (BUSPAR) 5 MG tablet Take 1 tablet (5 mg total) by mouth 2 (two) times daily.   Carboxymethylcellul-Glycerin (CLEAR EYES FOR DRY EYES OP) Place 1 drop into the left eye in the morning and at bedtime.   celecoxib (CELEBREX) 200 MG capsule Take 1 capsule (200 mg total) by mouth daily.   docusate sodium (COLACE) 100 MG capsule Take 1 capsule (100 mg total) by mouth daily. (Patient taking differently: Take 100 mg by mouth 2 (two) times daily.)   fluticasone (FLONASE) 50 MCG/ACT nasal spray Place into both nostrils.   gabapentin (NEURONTIN) 100 MG capsule Take 1 capsule (100 mg total) by mouth at bedtime.   hydrochlorothiazide (HYDRODIURIL) 25 MG tablet Take 1 tablet (25 mg total) by mouth daily.   hydrOXYzine (VISTARIL) 25 MG capsule Take 25 mg by mouth 3 (three) times daily.   ketoconazole (NIZORAL) 2 % cream Apply 1 application topically daily.   levETIRAcetam (KEPPRA) 250 MG tablet Take by mouth.   Lidocaine 4 % PTCH Apply 1 patch topically daily.   menthol-cetylpyridinium (CEPACOL) 3 MG lozenge Take 1 lozenge (3 mg total) by mouth as needed for sore throat.   mupirocin ointment (BACTROBAN) 2 % Place 1 application into the nose 2 (two) times daily.   nicotine (NICODERM CQ - DOSED IN MG/24 HOURS) 14 mg/24hr patch Place 1 patch (14 mg  total) onto the skin daily.   omeprazole (PRILOSEC) 20 MG capsule Take 1 capsule (20 mg total) by mouth daily.   ondansetron (ZOFRAN) 4 MG tablet Take 1 tablet (4 mg total) by mouth every 6 (six) hours as needed for nausea.   oxyCODONE (OXY IR/ROXICODONE) 5 MG immediate release tablet Take 1-2 tablets (5-10 mg total) by mouth every 6 (six) hours as needed for severe pain.   polyethylene glycol (MIRALAX / GLYCOLAX) 17 g packet Take 17 g by  mouth daily as needed for mild constipation.   promethazine (PHENERGAN) 12.5 MG tablet Take 1 tablet (12.5 mg total) by mouth every 8 (eight) hours as needed for nausea or vomiting.   senna-docusate (SENOKOT-S) 8.6-50 MG tablet Take 2 tablets by mouth at bedtime.   tamsulosin (FLOMAX) 0.4 MG CAPS capsule Take 0.4 mg by mouth in the morning and at bedtime.   traZODone (DESYREL) 150 MG tablet Take 1 tablet (150 mg total) by mouth at bedtime.   triamcinolone cream (KENALOG) 0.1 % Apply 1 application topically 2 (two) times daily.   [DISCONTINUED] acetaminophen (TYLENOL) 325 MG tablet Take 650 mg by mouth daily.   [DISCONTINUED] baclofen (LIORESAL) 10 MG tablet Take 1 tablet (10 mg total) by mouth 3 (three) times daily.   [DISCONTINUED] cetirizine (ZYRTEC) 10 MG tablet Take 1 tablet (10 mg total) by mouth daily.   [DISCONTINUED] hydrOXYzine (ATARAX/VISTARIL) 25 MG tablet Take 1 tablet (25 mg total) by mouth 3 (three) times daily.   [DISCONTINUED] levETIRAcetam (KEPPRA) 750 MG tablet Take 1 tablet (750 mg total) by mouth 2 (two) times daily.   [DISCONTINUED] methocarbamol (ROBAXIN) 500 MG tablet Take 1 tablet (500 mg total) by mouth every 6 (six) hours as needed for muscle spasms.   No facility-administered encounter medications on file as of 05/25/2021.    Allergies (verified) Penicillins   History: Past Medical History:  Diagnosis Date   BPH (benign prostatic hyperplasia)    Essential hypertension    GERD (gastroesophageal reflux disease)    Neuropathy    Seizure disorder ([place])    Stroke ([place])    had brain tumor, and had CVAs during operation for his brain tumor   Past Surgical History:  Procedure Laterality Date   BRAIN SURGERY  2013   brain tumor excision   INTRAMEDULLARY (IM) NAIL INTERTROCHANTERIC Left 02/15/2021   Procedure: INTRAMEDULLARY (IM) NAIL INTERTROCHANTRIC;  Surgeon: Paralee Cancel, MD;  Location: [facility];  Service: Orthopedics;  Laterality: Left;   shunt intracranial x2      Family History  Problem Relation Age of Onset   CAD Neg Hx    Social History   Socioeconomic History   Marital status: Single    Spouse name: Not on file   Number of children: 3   Years of education: Not on file   Highest education level: Not on file  Occupational History   Occupation: Disabled  Tobacco Use   Smoking status: Every Day    Packs/day: 1.50    Years: 25.00    Pack years: 37.50    Types: Cigarettes   Smokeless tobacco: Never  Vaping Use   Vaping Use: Some days   Substances: Nicotine  Substance and Sexual Activity   Alcohol use: Not Currently   Drug use: Not Currently   Sexual activity: Not Currently  Other Topics Concern   Not on file  Social History Narrative   2 in [place], the other daughter may be in [place], [place]   Social Determinants of  Health   Financial Resource Strain: Low Risk    Difficulty of Paying Living Expenses: Not hard at all  Food Insecurity: No Food Insecurity   Worried About [place] in the Last Year: Never true   Ran Out of Food in the Last Year: Never true  Transportation Needs: No Transportation Needs   Lack of Transportation (Medical): No   Lack of Transportation (Non-Medical): No  Physical Activity: Sufficiently Active   Days of Exercise per Week: 5 days   Minutes of Exercise per Session: 30 min  Stress: No Stress Concern Present   Feeling of Stress : Only a little  Social Connections: Moderately Integrated   Frequency of Communication with Friends and Family: More than three times a week   Frequency of Social Gatherings with Friends and Family: Three times a week   Attends Religious Services: 1 to 4 times per year   Active Member of Clubs or Organizations: Yes   Attends Archivist Meetings: 1 to 4 times per year   Marital Status: Divorced    Tobacco Counseling Ready to quit: No Counseling given: Not Answered   Clinical Intake:                 Diabetic?NO          Activities of Daily Living In your present state of health, do you have any difficulty performing the following activities: 05/25/2021 02/15/2021  Hearing? Y N  Comment Hearing loss L ear. -  Vision? N N  Difficulty concentrating or making decisions? N N  Walking or climbing stairs? Y Y  Dressing or bathing? N Y  Doing errands, shopping? N Y  Conservation officer, nature and eating ? N -  Using the Toilet? N -  In the past six months, have you accidently leaked urine? N -  Do you have problems with loss of bowel control? N -  Managing your Medications? N -  Managing your Finances? N -  Housekeeping or managing your Housekeeping? N -  Some recent data might be hidden    Patient Care Team: Noreene Larsson, NP as PCP - General (Nurse Practitioner) Eloise Harman, DO as Consulting Physician (Gastroenterology)  Indicate any recent Medical Services you may have received from other than [facility] providers in the past year (date may be approximate).     Assessment:   This is a routine wellness examination for [place].  Hearing/Vision screen Hearing Screening - Comments:: Hearing issues L ear.   Vision Screening - Comments:: No glasses. Overdue for exam. Would like referral.  Dietary issues and exercise activities discussed: Current Exercise Habits: Structured exercise class, Type of exercise: Other - see comments (Currently in rehab in [place] ), Time (Minutes): 30, Frequency (Times/Week): 5, Weekly Exercise (Minutes/Week): 150, Intensity: Mild, Exercise limited by: cardiac condition(s);neurologic condition(s);orthopedic condition(s)   Goals Addressed             This Visit's Progress    Set My Weight Loss Goal   Not on track      Depression Screen PHQ 2/9 Scores 05/25/2021 02/09/2021 02/02/2021 02/02/2021 01/14/2021  PHQ - 2 Score 0 0 3 3 0  PHQ- 9 Score - 0 8 8 -    Fall Risk Fall Risk  05/25/2021 02/09/2021 02/02/2021 01/14/2021  Falls in the past year? 0 0 0 0  Number falls in past  yr: 0 0 0 0  Injury with Fall? 1 0 0 0  Risk for fall due to :  History of fall(s);Impaired balance/gait;Impaired mobility No Fall Risks No Fall Risks No Fall Risks  Follow up Falls prevention discussed Falls evaluation completed Falls evaluation completed Falls evaluation completed    [place]:  Any stairs in or around the home? No  If so, are there any without handrails? No  Home free of loose throw rugs in walkways, pet beds, electrical cords, etc? Yes  Adequate lighting in your home to reduce risk of falls? Yes   ASSISTIVE DEVICES UTILIZED TO PREVENT FALLS:  Life alert? No  Use of a cane, walker or w/c? Yes  Grab bars in the bathroom? Yes  Shower chair or bench in shower? Yes  Elevated toilet seat or a handicapped toilet? Yes   TIMED UP AND GO:  Was the test performed? No . Phone visit.  Cognitive Function:     6CIT Screen 05/25/2021 02/02/2021  What Year? 0 points 0 points  What month? 0 points 3 points  What time? 0 points 0 points  Count back from 20 0 points 0 points  Months in reverse 0 points 0 points  Repeat phrase 0 points 2 points  Total Score 0 5    Immunizations Immunization History  Administered Date(s) Administered   Moderna SARS-COV2 Booster Vaccination 02/19/2021    TDAP status: Due, Education has been provided regarding the importance of this vaccine. Advised may receive this vaccine at local pharmacy or Health Dept. Aware to provide a copy of the vaccination record if obtained from local pharmacy or Health Dept. Verbalized acceptance and understanding.  Flu Vaccine status: Due, Education has been provided regarding the importance of this vaccine. Advised may receive this vaccine at local pharmacy or Health Dept. Aware to provide a copy of the vaccination record if obtained from local pharmacy or Health Dept. Verbalized acceptance and understanding.  Pneumococcal vaccine status: Due, Education has been provided  regarding the importance of this vaccine. Advised may receive this vaccine at local pharmacy or Health Dept. Aware to provide a copy of the vaccination record if obtained from local pharmacy or Health Dept. Verbalized acceptance and understanding.  Covid-19 vaccine status: Information provided on how to obtain vaccines.   Qualifies for Shingles Vaccine? Yes   Zostavax completed No   Shingrix Completed?: No.    Education has been provided regarding the importance of this vaccine. Patient has been advised to call insurance company to determine out of pocket expense if they have not yet received this vaccine. Advised may also receive vaccine at local pharmacy or Health Dept. Verbalized acceptance and understanding.  Screening Tests Health Maintenance  Topic Date Due   URINE MICROALBUMIN  Never done   HIV Screening  Never done   TETANUS/TDAP  Never done   COLONOSCOPY (Pts 45-14yrs Insurance coverage will need to be confirmed)  Never done   Zoster Vaccines- Shingrix (1 of 2) Never done   COVID-19 Vaccine (2 - Moderna series) 03/19/2021   INFLUENZA VACCINE  Never done   Hepatitis C Screening  Completed   HPV VACCINES  Aged Out    Health Maintenance  Health Maintenance Due  Topic Date Due   URINE MICROALBUMIN  Never done   HIV Screening  Never done   TETANUS/TDAP  Never done   COLONOSCOPY (Pts 45-30yrs Insurance coverage will need to be confirmed)  Never done   Zoster Vaccines- Shingrix (1 of 2) Never done   COVID-19 Vaccine (2 - Moderna series) 03/19/2021   INFLUENZA VACCINE  Never  done    Colorectal cancer screening: Referral to GI placed 05/25/2021. Pt aware the office will call re: appt.  Lung Cancer Screening: (Low Dose CT Chest recommended if Age 51-80 years, 30 pack-year currently smoking OR have quit w/in 15years.) does not qualify.    Additional Screening:  Hepatitis C Screening: does not qualify; Completed 01/29/2021  Vision Screening: Recommended annual ophthalmology  exams for early detection of glaucoma and other disorders of the eye. Is the patient up to date with their annual eye exam?  No  Who is the provider or what is the name of the office in which the patient attends annual eye exams? N/A If pt is not established with a provider, would they like to be referred to a provider to establish care? Yes .   Dental Screening: Recommended annual dental exams for proper oral hygiene  Community Resource Referral / Chronic Care Management: CRR required this visit?  No   CCM required this visit?  No      Plan:     I have personally reviewed and noted the following in the patient's chart:   Medical and social history Use of alcohol, tobacco or illicit drugs  Current medications and supplements including opioid prescriptions. Patient is currently taking opioid prescriptions. Information provided to patient regarding non-opioid alternatives. Patient advised to discuss non-opioid treatment plan with their provider. Functional ability and status Nutritional status Physical activity Advanced directives List of other physicians Hospitalizations, surgeries, and ER visits in previous 12 months Vitals Screenings to include cognitive, depression, and falls Referrals and appointments  In addition, I have reviewed and discussed with patient certain preventive protocols, quality metrics, and best practice recommendations. A written personalized care plan for preventive services as well as general preventive health recommendations were provided to patient.     Chriss Driver, LPN   [id number]   Nurse Notes: Pt currently in [facility] in [place], s/p fall and fractured femur. Pt states he is unsure how long he will be there. Pt asked for referral to [facility] and for a colonoscopy. Referral made for both.  Pt has appointment at [facility] on 06/04/21 @ 10 am. Called patient's bother Michail Boyte to have transportation set up for patient. LM on  identified voicemail.

## 2021-05-25 NOTE — Patient Instructions (Signed)
[name] , Thank you for taking time to come for your Medicare Wellness Visit. I appreciate your ongoing commitment to your health goals. Please review the following plan we discussed and let me know if I can assist you in the future.   Screening recommendations/referrals: Colonoscopy: Due. Repeat in 10 years  Recommended yearly ophthalmology/optometry visit for glaucoma screening and checkup Recommended yearly dental visit for hygiene and checkup  Vaccinations: Influenza vaccine: Due. Repeat annually  Pneumococcal vaccine: Not due until age 8 Tdap vaccine: Due Repeat in 10 years  Shingles vaccine: Shingrix discussed. Please contact your pharmacy for coverage information.     Covid-19: Done 02/19/21  Advanced directives: Please bring a copy of your health care power of attorney and living will to the office to be added to your chart at your convenience.   Conditions/risks identified: Aim for 30 minutes of exercise a day, drink 6-8 glasses of water and eat lots of fruits and vegetables.   Next appointment: Follow up in one year for your annual wellness visit 2023  Preventive Care 40-64 Years, Male Preventive care refers to lifestyle choices and visits with your health care provider that can promote health and wellness. What does preventive care include? A yearly physical exam. This is also called an annual well check. Dental exams once or twice a year. Routine eye exams. Ask your health care provider how often you should have your eyes checked. Personal lifestyle choices, including: Daily care of your teeth and gums. Regular physical activity. Eating a healthy diet. Avoiding tobacco and drug use. Limiting alcohol use. Practicing safe sex. Taking low-dose aspirin every day starting at age 107. What happens during an annual well check? The services and screenings done by your health care provider during your annual well check will depend on your age, overall health,  lifestyle risk factors, and family history of disease. Counseling  Your health care provider may ask you questions about your: Alcohol use. Tobacco use. Drug use. Emotional well-being. Home and relationship well-being. Sexual activity. Eating habits. Work and work Statistician. Screening  You may have the following tests or measurements: Height, weight, and BMI. Blood pressure. Lipid and cholesterol levels. These may be checked every 5 years, or more frequently if you are over 67 years old. Skin check. Lung cancer screening. You may have this screening every year starting at age 36 if you have a 30-pack-year history of smoking and currently smoke or have quit within the past 15 years. Fecal occult blood test (FOBT) of the stool. You may have this test every year starting at age 8. Flexible sigmoidoscopy or colonoscopy. You may have a sigmoidoscopy every 5 years or a colonoscopy every 10 years starting at age 53. Prostate cancer screening. Recommendations will vary depending on your family history and other risks. Hepatitis C blood test. Hepatitis B blood test. Sexually transmitted disease (STD) testing. Diabetes screening. This is done by checking your blood sugar (glucose) after you have not eaten for a while (fasting). You may have this done every 1-3 years. Discuss your test results, treatment options, and if necessary, the need for more tests with your health care provider. Vaccines  Your health care provider may recommend certain vaccines, such as: Influenza vaccine. This is recommended every year. Tetanus, diphtheria, and acellular pertussis (Tdap, Td) vaccine. You may need a Td booster every 10 years. Zoster vaccine. You may need this after age 59. Pneumococcal 13-valent conjugate (PCV13) vaccine. You may need this if you have certain conditions and  have not been vaccinated. Pneumococcal polysaccharide (PPSV23) vaccine. You may need one or two doses if you smoke cigarettes or  if you have certain conditions. Talk to your health care provider about which screenings and vaccines you need and how often you need them. This information is not intended to replace advice given to you by your health care provider. Make sure you discuss any questions you have with your health care provider. Document Released: 09/05/2015 Document Revised: 04/28/2016 Document Reviewed: 06/10/2015 Elsevier Interactive Patient Education  2017 [place] Prevention in the Home Falls can cause injuries. They can happen to people of all ages. There are many things you can do to make your home safe and to help prevent falls. What can I do on the outside of my home? Regularly fix the edges of walkways and driveways and fix any cracks. Remove anything that might make you trip as you walk through a door, such as a raised step or threshold. Trim any bushes or trees on the path to your home. Use bright outdoor lighting. Clear any walking paths of anything that might make someone trip, such as rocks or tools. Regularly check to see if handrails are loose or broken. Make sure that both sides of any steps have handrails. Any raised decks and porches should have guardrails on the edges. Have any leaves, snow, or ice cleared regularly. Use sand or salt on walking paths during winter. Clean up any spills in your garage right away. This includes oil or grease spills. What can I do in the bathroom? Use night lights. Install grab bars by the toilet and in the tub and shower. Do not use towel bars as grab bars. Use non-skid mats or decals in the tub or shower. If you need to sit down in the shower, use a plastic, non-slip stool. Keep the floor dry. Clean up any water that spills on the floor as soon as it happens. Remove soap buildup in the tub or shower regularly. Attach bath mats securely with double-sided non-slip rug tape. Do not have throw rugs and other things on the floor that can make you  trip. What can I do in the bedroom? Use night lights. Make sure that you have a light by your bed that is easy to reach. Do not use any sheets or blankets that are too big for your bed. They should not hang down onto the floor. Have a firm chair that has side arms. You can use this for support while you get dressed. Do not have throw rugs and other things on the floor that can make you trip. What can I do in the kitchen? Clean up any spills right away. Avoid walking on wet floors. Keep items that you use a lot in easy-to-reach places. If you need to reach something above you, use a strong step stool that has a grab bar. Keep electrical cords out of the way. Do not use floor polish or wax that makes floors slippery. If you must use wax, use non-skid floor wax. Do not have throw rugs and other things on the floor that can make you trip. What can I do with my stairs? Do not leave any items on the stairs. Make sure that there are handrails on both sides of the stairs and use them. Fix handrails that are broken or loose. Make sure that handrails are as long as the stairways. Check any carpeting to make sure that it is firmly attached to the stairs.  Fix any carpet that is loose or worn. Avoid having throw rugs at the top or bottom of the stairs. If you do have throw rugs, attach them to the floor with carpet tape. Make sure that you have a light switch at the top of the stairs and the bottom of the stairs. If you do not have them, ask someone to add them for you. What else can I do to help prevent falls? Wear shoes that: Do not have high heels. Have rubber bottoms. Are comfortable and fit you well. Are closed at the toe. Do not wear sandals. If you use a stepladder: Make sure that it is fully opened. Do not climb a closed stepladder. Make sure that both sides of the stepladder are locked into place. Ask someone to hold it for you, if possible. Clearly mark and make sure that you can  see: Any grab bars or handrails. First and last steps. Where the edge of each step is. Use tools that help you move around (mobility aids) if they are needed. These include: Canes. Walkers. Scooters. Crutches. Turn on the lights when you go into a dark area. Replace any light bulbs as soon as they burn out. Set up your furniture so you have a clear path. Avoid moving your furniture around. If any of your floors are uneven, fix them. If there are any pets around you, be aware of where they are. Review your medicines with your doctor. Some medicines can make you feel dizzy. This can increase your chance of falling. Ask your doctor what other things that you can do to help prevent falls. This information is not intended to replace advice given to you by your health care provider. Make sure you discuss any questions you have with your health care provider. Document Released: 06/05/2009 Document Revised: 01/15/2016 Document Reviewed: 09/13/2014 Elsevier Interactive Patient Education  2017 [employer].

## 2021-05-27 ENCOUNTER — Other Ambulatory Visit: Payer: Self-pay

## 2021-05-27 DIAGNOSIS — Z1211 Encounter for screening for malignant neoplasm of colon: Secondary | ICD-10-CM

## 2021-05-27 NOTE — Progress Notes (Signed)
Gastroenterology Pre-Procedure Review  Request Date: 06/08/21 Requesting Physician: Dr. Vicente Males  PATIENT REVIEW QUESTIONS: The patient responded to the following health history questions as indicated:  Magda Paganini from [facility] answered questions regarding patient.  1. Are you having any GI issues? no 2. Do you have a personal history of Polyps? no 3. Do you have a family history of Colon Cancer or Polyps? no 4. Diabetes Mellitus? no 5. Joint replacements in the past 12 months?no 6. Major health problems in the past 3 months?no 7. Any artificial heart valves, MVP, or defibrillator?no    MEDICATIONS & ALLERGIES:    Patient reports the following regarding taking any anticoagulation/antiplatelet therapy:   Plavix, Coumadin, Eliquis, Xarelto, Lovenox, Pradaxa, Brilinta, or Effient? no Aspirin? no  Patient confirms/reports the following medications:  Current Outpatient Medications  Medication Sig Dispense Refill   ACETAMINOPHEN EXTRA STRENGTH 500 MG tablet Take 500 mg by mouth every 8 (eight) hours.     atorvastatin (LIPITOR) 40 MG tablet Take 40 mg by mouth at bedtime.     Baclofen 5 MG TABS SMARTSIG:3 Tablet(s) By Mouth Every 8 Hours PRN     bisacodyl (DULCOLAX) 10 MG suppository Place 1 suppository (10 mg total) rectally daily as needed for moderate constipation. 12 suppository 0   busPIRone (BUSPAR) 5 MG tablet Take 1 tablet (5 mg total) by mouth 2 (two) times daily. 60 tablet 3   Carboxymethylcellul-Glycerin (CLEAR EYES FOR DRY EYES OP) Place 1 drop into the left eye in the morning and at bedtime.     celecoxib (CELEBREX) 200 MG capsule Take 1 capsule (200 mg total) by mouth daily. 30 capsule 3   docusate sodium (COLACE) 100 MG capsule Take 1 capsule (100 mg total) by mouth daily. (Patient taking differently: Take 100 mg by mouth 2 (two) times daily.) 10 capsule 3   fluticasone (FLONASE) 50 MCG/ACT nasal spray Place into both nostrils.     gabapentin (NEURONTIN) 100 MG capsule Take 1  capsule (100 mg total) by mouth at bedtime. 30 capsule 3   hydrochlorothiazide (HYDRODIURIL) 25 MG tablet Take 1 tablet (25 mg total) by mouth daily. 30 tablet 3   hydrOXYzine (VISTARIL) 25 MG capsule Take 25 mg by mouth 3 (three) times daily.     ketoconazole (NIZORAL) 2 % cream Apply 1 application topically daily. 15 g 0   levETIRAcetam (KEPPRA) 250 MG tablet Take by mouth.     Lidocaine 4 % PTCH Apply 1 patch topically daily.     menthol-cetylpyridinium (CEPACOL) 3 MG lozenge Take 1 lozenge (3 mg total) by mouth as needed for sore throat. 100 tablet 12   mupirocin ointment (BACTROBAN) 2 % Place 1 application into the nose 2 (two) times daily. 22 g 0   nicotine (NICODERM CQ - DOSED IN MG/24 HOURS) 14 mg/24hr patch Place 1 patch (14 mg total) onto the skin daily. 28 patch 0   omeprazole (PRILOSEC) 20 MG capsule Take 1 capsule (20 mg total) by mouth daily. 30 capsule 3   ondansetron (ZOFRAN) 4 MG tablet Take 1 tablet (4 mg total) by mouth every 6 (six) hours as needed for nausea. 20 tablet 0   oxyCODONE (OXY IR/ROXICODONE) 5 MG immediate release tablet Take 1-2 tablets (5-10 mg total) by mouth every 6 (six) hours as needed for severe pain. 42 tablet 0   polyethylene glycol (MIRALAX / GLYCOLAX) 17 g packet Take 17 g by mouth daily as needed for mild constipation. 14 each 0   promethazine (PHENERGAN) 12.5 MG  tablet Take 1 tablet (12.5 mg total) by mouth every 8 (eight) hours as needed for nausea or vomiting. 20 tablet 0   senna-docusate (SENOKOT-S) 8.6-50 MG tablet Take 2 tablets by mouth at bedtime. 60 tablet 0   tamsulosin (FLOMAX) 0.4 MG CAPS capsule Take 0.4 mg by mouth in the morning and at bedtime.     traZODone (DESYREL) 150 MG tablet Take 1 tablet (150 mg total) by mouth at bedtime. 90 tablet 3   triamcinolone cream (KENALOG) 0.1 % Apply 1 application topically 2 (two) times daily. 30 g 0   No current facility-administered medications for this visit.    Patient confirms/reports the  following allergies:  Allergies  Allergen Reactions   Penicillins     No orders of the defined types were placed in this encounter.   AUTHORIZATION INFORMATION Primary Insurance: 1D#: Group #:  Secondary Insurance: 1D#: Group #:  SCHEDULE INFORMATION: Date: 06/08/21 Time: Location: [place]

## 2021-06-01 ENCOUNTER — Telehealth: Payer: Self-pay

## 2021-06-01 NOTE — Telephone Encounter (Signed)
Pt. Contact calling to see if the prep has been sent in to the pharmacy for [name] and what pharmacy. She is requesting a call back to confirm.He name is [place].

## 2021-06-02 ENCOUNTER — Other Ambulatory Visit: Payer: Self-pay

## 2021-06-02 MED ORDER — NA SULFATE-K SULFATE-MG SULF 17.5-3.13-1.6 GM/177ML PO SOLN: 1.0000 | Freq: Once | ORAL | 0 refills | Status: AC

## 2021-06-02 NOTE — Telephone Encounter (Signed)
Returned call to Ms. Hall Busing informed her prescription was included in the paperwork that was mailed. She believes prescription was lost. New prescription for Suprep was faxed over.

## 2021-06-02 NOTE — Progress Notes (Signed)
Another bowel prep prescription will be faxed to nursing facility.

## 2021-06-04 ENCOUNTER — Ambulatory Visit: Payer: Medicare Other | Admitting: Nurse Practitioner

## 2021-06-08 ENCOUNTER — Encounter: Admission: RE | Payer: Self-pay | Source: Home / Self Care

## 2021-06-08 ENCOUNTER — Other Ambulatory Visit: Payer: Self-pay

## 2021-06-08 ENCOUNTER — Encounter: Payer: Self-pay | Admitting: Registered Nurse

## 2021-06-08 ENCOUNTER — Ambulatory Visit: Admission: RE | Admit: 2021-06-08 | Payer: Medicare Other | Source: Home / Self Care | Admitting: Gastroenterology

## 2021-06-08 DIAGNOSIS — Z1211 Encounter for screening for malignant neoplasm of colon: Secondary | ICD-10-CM

## 2021-06-08 SURGERY — COLONOSCOPY WITH PROPOFOL
Anesthesia: General

## 2021-06-08 MED ORDER — PHENYLEPHRINE HCL (PRESSORS) 10 MG/ML IV SOLN
INTRAVENOUS | Status: AC
  Filled 2021-06-08: qty 1

## 2021-06-08 MED ORDER — PROPOFOL 500 MG/50ML IV EMUL
INTRAVENOUS | Status: AC
  Filled 2021-06-08: qty 250

## 2021-06-08 MED ORDER — LIDOCAINE HCL (PF) 2 % IJ SOLN
INTRAMUSCULAR | Status: AC
  Filled 2021-06-08: qty 25

## 2021-06-08 NOTE — Progress Notes (Signed)
Patient procedure was cancelled due to Nursing facility not filling his bowel prep prescription. Procedure has been rescheduled and I will fax [facility] the instructions.

## 2021-06-23 ENCOUNTER — Encounter: Admission: RE | Payer: Self-pay | Source: Home / Self Care

## 2021-06-23 ENCOUNTER — Encounter: Payer: Self-pay | Admitting: Anesthesiology

## 2021-06-23 ENCOUNTER — Telehealth: Payer: Self-pay

## 2021-06-23 ENCOUNTER — Ambulatory Visit: Admission: RE | Admit: 2021-06-23 | Payer: Medicare Other | Source: Home / Self Care | Admitting: Gastroenterology

## 2021-06-23 SURGERY — COLONOSCOPY WITH PROPOFOL
Anesthesia: General

## 2021-06-23 NOTE — Anesthesia Preprocedure Evaluation (Deleted)
Anesthesia Evaluation    Airway        Dental   Pulmonary Current Smoker,           Cardiovascular hypertension,   ECG 02/18/21:  Sinus tachycardia Cannot rule out Anterior infarct , age undetermined Borderline QT prolongation   Neuro/Psych Seizures -,  PSYCHIATRIC DISORDERS Anxiety Brain tumor s/p surgery 2013; chronic pain  Neuromuscular disease (neuropathy) CVA    GI/Hepatic GERD  ,  Endo/Other    Renal/GU    BPH    Musculoskeletal   Abdominal   Peds  Hematology   Anesthesia Other Findings   Reproductive/Obstetrics                             Anesthesia Physical Anesthesia Plan  ASA: 3  Anesthesia Plan: General   Post-op Pain Management:    Induction: Intravenous  PONV Risk Score and Plan: 1 and Propofol infusion, TIVA and Treatment may vary due to age or medical condition  Airway Management Planned: Natural Airway  Additional Equipment:   Intra-op Plan:   Post-operative Plan:   Informed Consent: I have reviewed the patients History and Physical, chart, labs and discussed the procedure including the risks, benefits and alternatives for the proposed anesthesia with the patient or authorized representative who has indicated his/her understanding and acceptance.       Plan Discussed with: CRNA  Anesthesia Plan Comments:         Anesthesia Quick Evaluation

## 2021-06-23 NOTE — Telephone Encounter (Signed)
Representative at nursing home calling to reschedule his colonoscopy. Magda Paganini [phone].

## 2021-06-24 NOTE — Telephone Encounter (Signed)
Returned [place] call. LVM to call office back.

## 2021-06-25 ENCOUNTER — Telehealth: Payer: Self-pay

## 2021-06-30 ENCOUNTER — Other Ambulatory Visit: Payer: Self-pay

## 2021-06-30 ENCOUNTER — Ambulatory Visit: Payer: Medicare Other | Admitting: Nurse Practitioner

## 2021-06-30 ENCOUNTER — Telehealth: Payer: Self-pay | Admitting: Gastroenterology

## 2021-06-30 DIAGNOSIS — Z1211 Encounter for screening for malignant neoplasm of colon: Secondary | ICD-10-CM

## 2021-06-30 NOTE — Progress Notes (Signed)
Nursing Facility has rescheduled patient for 07/09/21.

## 2021-06-30 NOTE — Telephone Encounter (Signed)
Patient has been schedule for 11/17. Will fax instructions to [facility].

## 2021-07-08 ENCOUNTER — Telehealth: Payer: Self-pay

## 2021-07-08 NOTE — Telephone Encounter (Signed)
Leslie-patient's caregiver called stating that the patient ate today, therefore, he needed to reschedule his colonoscopy again for 07/13/2021 at [facility] with Dr. Allen Norris.

## 2021-07-09 ENCOUNTER — Encounter: Payer: Self-pay | Admitting: Anesthesiology

## 2021-07-09 ENCOUNTER — Encounter: Payer: Self-pay | Admitting: Gastroenterology

## 2021-07-13 ENCOUNTER — Ambulatory Visit: Admission: RE | Admit: 2021-07-13 | Payer: Medicare Other | Source: Home / Self Care | Admitting: Gastroenterology

## 2021-07-13 HISTORY — DX: Other symptoms and signs involving the musculoskeletal system: R29.898

## 2021-07-13 SURGERY — COLONOSCOPY WITH PROPOFOL
Anesthesia: General

## 2021-07-14 ENCOUNTER — Telehealth: Payer: Self-pay | Admitting: Gastroenterology

## 2021-07-14 NOTE — Telephone Encounter (Signed)
Returned Leslie's call. LVM to call office back.

## 2021-07-14 NOTE — Telephone Encounter (Signed)
Inbound call from [facility] requesting a call back stating that she needs to schedule procedure for the pt. Best contact information [phone].

## 2021-07-15 NOTE — Telephone Encounter (Signed)
Magda Paganini was returning your phone cal. She asked if you can call on Monday.

## 2021-07-20 NOTE — Telephone Encounter (Signed)
Inbound call from [facility] returning your phone call

## 2021-07-20 NOTE — Telephone Encounter (Signed)
Returned [place] call. LVM to call office back.

## 2021-07-21 ENCOUNTER — Other Ambulatory Visit: Payer: Self-pay

## 2021-07-21 DIAGNOSIS — Z1211 Encounter for screening for malignant neoplasm of colon: Secondary | ICD-10-CM

## 2021-07-21 NOTE — Progress Notes (Signed)
Procedure has been rescheduled for 08/11/21. Will fax Magda Paganini the instructions for patient.

## 2021-07-21 NOTE — Telephone Encounter (Signed)
Procedure has been scheduled for 08/11/21.

## 2021-07-29 ENCOUNTER — Telehealth: Payer: Self-pay | Admitting: Nurse Practitioner

## 2021-07-29 NOTE — Telephone Encounter (Signed)
Pt is in rehab facility   Pt has been since July 2022

## 2021-07-30 ENCOUNTER — Ambulatory Visit: Payer: Medicare Other | Admitting: Nurse Practitioner

## 2021-07-31 ENCOUNTER — Other Ambulatory Visit: Payer: Self-pay | Admitting: Nurse Practitioner

## 2021-07-31 DIAGNOSIS — R918 Other nonspecific abnormal finding of lung field: Secondary | ICD-10-CM

## 2021-08-10 ENCOUNTER — Encounter: Payer: Self-pay | Admitting: Gastroenterology

## 2021-08-11 ENCOUNTER — Ambulatory Visit: Payer: Medicare Other | Admitting: Anesthesiology

## 2021-08-11 ENCOUNTER — Ambulatory Visit
Admission: RE | Admit: 2021-08-11 | Discharge: 2021-08-11 | Disposition: A | Discharge status: Home / Self Care | Payer: Medicare Other | Attending: Gastroenterology | Admitting: Gastroenterology

## 2021-08-11 ENCOUNTER — Encounter
Admission: RE | Disposition: A | Discharge status: Home / Self Care | Payer: Self-pay | Source: Home / Self Care | Attending: Gastroenterology

## 2021-08-11 ENCOUNTER — Encounter: Payer: Self-pay | Admitting: Gastroenterology

## 2021-08-11 DIAGNOSIS — D122 Benign neoplasm of ascending colon: Secondary | ICD-10-CM | POA: Diagnosis not present

## 2021-08-11 DIAGNOSIS — K648 Other hemorrhoids: Secondary | ICD-10-CM | POA: Insufficient documentation

## 2021-08-11 DIAGNOSIS — I10 Essential (primary) hypertension: Secondary | ICD-10-CM | POA: Insufficient documentation

## 2021-08-11 DIAGNOSIS — D120 Benign neoplasm of cecum: Secondary | ICD-10-CM | POA: Insufficient documentation

## 2021-08-11 DIAGNOSIS — D123 Benign neoplasm of transverse colon: Secondary | ICD-10-CM | POA: Diagnosis not present

## 2021-08-11 DIAGNOSIS — R569 Unspecified convulsions: Secondary | ICD-10-CM | POA: Insufficient documentation

## 2021-08-11 DIAGNOSIS — D125 Benign neoplasm of sigmoid colon: Secondary | ICD-10-CM | POA: Diagnosis not present

## 2021-08-11 DIAGNOSIS — J449 Chronic obstructive pulmonary disease, unspecified: Secondary | ICD-10-CM | POA: Diagnosis not present

## 2021-08-11 DIAGNOSIS — D124 Benign neoplasm of descending colon: Secondary | ICD-10-CM | POA: Diagnosis not present

## 2021-08-11 DIAGNOSIS — Z1211 Encounter for screening for malignant neoplasm of colon: Secondary | ICD-10-CM

## 2021-08-11 DIAGNOSIS — F17210 Nicotine dependence, cigarettes, uncomplicated: Secondary | ICD-10-CM | POA: Insufficient documentation

## 2021-08-11 DIAGNOSIS — K219 Gastro-esophageal reflux disease without esophagitis: Secondary | ICD-10-CM | POA: Diagnosis not present

## 2021-08-11 DIAGNOSIS — K635 Polyp of colon: Secondary | ICD-10-CM

## 2021-08-11 HISTORY — PX: COLONOSCOPY WITH PROPOFOL: SHX5780

## 2021-08-11 SURGERY — COLONOSCOPY WITH PROPOFOL
Anesthesia: General

## 2021-08-11 MED ORDER — PROPOFOL 10 MG/ML IV BOLUS
INTRAVENOUS | Status: DC | PRN
  Administered 2021-08-11: 70 mg via INTRAVENOUS

## 2021-08-11 MED ORDER — PROPOFOL 500 MG/50ML IV EMUL
INTRAVENOUS | Status: DC | PRN
  Administered 2021-08-11: 140 ug/kg/min via INTRAVENOUS

## 2021-08-11 MED ORDER — PHENYLEPHRINE HCL (PRESSORS) 10 MG/ML IV SOLN
INTRAVENOUS | Status: DC | PRN
  Administered 2021-08-11: 80 ug via INTRAVENOUS

## 2021-08-11 MED ORDER — EPHEDRINE SULFATE 50 MG/ML IJ SOLN
INTRAMUSCULAR | Status: DC | PRN
Start: 2021-08-11 — End: 2021-08-11
  Administered 2021-08-11: 5 mg via INTRAVENOUS

## 2021-08-11 MED ORDER — SODIUM CHLORIDE 0.9 % IV SOLN: INTRAVENOUS | Status: DC

## 2021-08-11 NOTE — H&P (Signed)
[place] Lame, MD [facility] [address], [place], [place] [address] Phone: [phone] Fax : [phone]  Primary Care Physician:  Noreene Larsson, NP Primary Gastroenterologist:  Dr. Allen Norris  Pre-Procedure History & Physical: HPI:  [place] is a 50 y.o. male is here for a screening colonoscopy.   Past Medical History:  Diagnosis Date   BPH (benign prostatic hyperplasia)    Essential hypertension    GERD (gastroesophageal reflux disease)    Left leg weakness    S/P stroke   Neuropathy    Seizure disorder ([place])    Stroke ([place])    had brain tumor, and had CVAs during operation for his brain tumor    Past Surgical History:  Procedure Laterality Date   BRAIN SURGERY  2013   brain tumor excision   INTRAMEDULLARY (IM) NAIL INTERTROCHANTERIC Left 02/15/2021   Procedure: INTRAMEDULLARY (IM) NAIL INTERTROCHANTRIC;  Surgeon: Paralee Cancel, MD;  Location: [place];  Service: Orthopedics;  Laterality: Left;   shunt intracranial x2      Prior to Admission medications   Medication Sig Start Date End Date Taking? Authorizing Provider  celecoxib (CELEBREX) 200 MG capsule Take 1 capsule (200 mg total) by mouth daily. 05/14/21  Yes Noreene Larsson, NP  gabapentin (NEURONTIN) 100 MG capsule Take 1 capsule (100 mg total) by mouth at bedtime. 01/15/21  Yes Noreene Larsson, NP  hydrOXYzine (VISTARIL) 25 MG capsule Take 25 mg by mouth 3 (three) times daily. 05/20/21  Yes [provider]  levETIRAcetam (KEPPRA) 250 MG tablet Take by mouth. 04/01/21  Yes [provider]  oxyCODONE (OXY IR/ROXICODONE) 5 MG immediate release tablet Take 1-2 tablets (5-10 mg total) by mouth every 6 (six) hours as needed for severe pain. 02/16/21  Yes Irving Copas, PA-C  traZODone (DESYREL) 150 MG tablet Take 1 tablet (150 mg total) by mouth at bedtime. 01/15/21  Yes Noreene Larsson, NP  ACETAMINOPHEN EXTRA STRENGTH 500 MG tablet Take 500 mg by mouth every 8 (eight) hours. 05/11/21   [provider]  atorvastatin (LIPITOR) 40 MG tablet Take 40 mg by mouth at bedtime.    [provider]  Baclofen 5 MG TABS SMARTSIG:3 Tablet(s) By Mouth Every 8 Hours PRN 05/20/21   [provider]  bisacodyl (DULCOLAX) 10 MG suppository Place 1 suppository (10 mg total) rectally daily as needed for moderate constipation. 02/18/21   Sheikh, Georgina Quint Latif, DO  busPIRone (BUSPAR) 5 MG tablet Take 1 tablet (5 mg total) by mouth 2 (two) times daily. 05/14/21   Noreene Larsson, NP  Carboxymethylcellul-Glycerin (CLEAR EYES FOR DRY EYES OP) Place 1 drop into the left eye in the morning and at bedtime.    [provider]  docusate sodium (COLACE) 100 MG capsule Take 1 capsule (100 mg total) by mouth daily. Patient taking differently: Take 100 mg by mouth 2 (two) times daily. 01/15/21   Noreene Larsson, NP  fluticasone Asencion Islam) 50 MCG/ACT nasal spray Place into both nostrils. 05/20/21   [provider]  hydrochlorothiazide (HYDRODIURIL) 25 MG tablet Take 1 tablet (25 mg total) by mouth daily. 05/14/21   Noreene Larsson, NP  ketoconazole (NIZORAL) 2 % cream Apply 1 application topically daily. 02/09/21   Noreene Larsson, NP  Lidocaine 4 % PTCH Apply 1 patch topically daily. 05/11/21   [provider]  Melatonin 5 MG CAPS Take 10 mg by mouth at bedtime.    [provider]  menthol-cetylpyridinium (CEPACOL)  3 MG lozenge Take 1 lozenge (3 mg total) by mouth as needed for sore throat. 02/18/21   Raiford Noble Latif, DO  mupirocin ointment (BACTROBAN) 2 % Place 1 application into the nose 2 (two) times daily. 02/18/21   Sheikh, Georgina Quint Latif, DO  nicotine (NICODERM CQ - DOSED IN MG/24 HOURS) 14 mg/24hr patch Place 1 patch (14 mg total) onto the skin daily. 02/19/21   Raiford Noble Latif, DO  omeprazole (PRILOSEC) 20 MG capsule Take 1 capsule (20 mg total) by mouth daily. 02/04/21   Noreene Larsson, NP  ondansetron (ZOFRAN) 4 MG tablet Take 1 tablet (4 mg total) by mouth every 6  (six) hours as needed for nausea. 02/18/21   Raiford Noble Latif, DO  polyethylene glycol (MIRALAX / GLYCOLAX) 17 g packet Take 17 g by mouth daily as needed for mild constipation. 02/16/21   Irving Copas, PA-C  promethazine (PHENERGAN) 12.5 MG tablet Take 1 tablet (12.5 mg total) by mouth every 8 (eight) hours as needed for nausea or vomiting. 01/15/21   Noreene Larsson, NP  senna-docusate (SENOKOT-S) 8.6-50 MG tablet Take 2 tablets by mouth at bedtime. 02/18/21   Raiford Noble Latif, DO  tamsulosin (FLOMAX) 0.4 MG CAPS capsule Take 0.4 mg by mouth in the morning and at bedtime. 08/20/20   [provider]  triamcinolone cream (KENALOG) 0.1 % Apply 1 application topically 2 (two) times daily. 02/09/21   Noreene Larsson, NP    Allergies as of 07/21/2021 - Review Complete 07/09/2021  Allergen Reaction Noted   Penicillins  01/14/2021    Family History  Problem Relation Age of Onset   CAD Neg Hx     Social History   Socioeconomic History   Marital status: Single    Spouse name: Not on file   Number of children: 3   Years of education: Not on file   Highest education level: Not on file  Occupational History   Occupation: Disabled  Tobacco Use   Smoking status: Every Day    Packs/day: 1.50    Years: 25.00    Pack years: 37.50    Types: Cigarettes   Smokeless tobacco: Never  Vaping Use   Vaping Use: Former   Substances: Nicotine  Substance and Sexual Activity   Alcohol use: Not Currently   Drug use: Not Currently   Sexual activity: Not Currently  Other Topics Concern   Not on file  Social History Narrative   2 in [place], the other daughter may be in [place], [place]   Social Determinants of Health   Financial Resource Strain: Low Risk    Difficulty of Paying Living Expenses: Not hard at all  Food Insecurity: No Food Insecurity   Worried About Charity fundraiser in the Last Year: Never true   [place] in the Last Year: Never true  Transportation Needs:  No Transportation Needs   Lack of Transportation (Medical): No   Lack of Transportation (Non-Medical): No  Physical Activity: Sufficiently Active   Days of Exercise per Week: 5 days   Minutes of Exercise per Session: 30 min  Stress: No Stress Concern Present   Feeling of Stress : Only a little  Social Connections: Moderately Integrated   Frequency of Communication with Friends and Family: More than three times a week   Frequency of Social Gatherings with Friends and Family: Three times a week   Attends Religious Services: 1 to 4 times per year   Active Member  of Clubs or Organizations: Yes   Attends Archivist Meetings: 1 to 4 times per year   Marital Status: Divorced  Human resources officer Violence: Not At Risk   Fear of Current or Ex-Partner: No   Emotionally Abused: No   Physically Abused: No   Sexually Abused: No    Review of Systems: See HPI, otherwise negative ROS  Physical Exam: BP 124/87    Pulse 89    Temp (!) 96.4 F (35.8 C) (Temporal)    Resp 18    Ht 6\' 2"  (1.88 m)    Wt 95.3 kg    SpO2 99%    BMI 26.96 kg/m  General:   Alert,  pleasant and cooperative in NAD Head:  Normocephalic and atraumatic. Neck:  Supple; no masses or thyromegaly. Lungs:  Clear throughout to auscultation.    Heart:  Regular rate and rhythm. Abdomen:  Soft, nontender and nondistended. Normal bowel sounds, without guarding, and without rebound.   Neurologic:  Alert and  oriented x4;  grossly normal neurologically.  Impression/Plan: [place] is now here to undergo a screening colonoscopy.  Risks, benefits, and alternatives regarding colonoscopy have been reviewed with the patient.  Questions have been answered.  All parties agreeable.

## 2021-08-11 NOTE — Anesthesia Preprocedure Evaluation (Signed)
Anesthesia Evaluation  Patient identified by MRN, date of birth, ID band Patient awake    Reviewed: Allergy & Precautions, NPO status , Patient's Chart, lab work & pertinent test results  Airway Mallampati: III  TM Distance: >3 FB Neck ROM: full  Mouth opening: Limited Mouth Opening  Dental  (+) Edentulous Lower, Missing,    Pulmonary neg pulmonary ROS, COPD, Current Smoker and Patient abstained from smoking.,    Pulmonary exam normal  + decreased breath sounds      Cardiovascular Exercise Tolerance: Poor hypertension, Pt. on medications negative cardio ROS Normal cardiovascular exam Rhythm:Regular Rate:Normal     Neuro/Psych Seizures -,  negative neurological ROS  negative psych ROS   GI/Hepatic negative GI ROS, Neg liver ROS, GERD  ,  Endo/Other  negative endocrine ROS  Renal/GU negative Renal ROS  negative genitourinary   Musculoskeletal negative musculoskeletal ROS (+)   Abdominal Normal abdominal exam  (+)   Peds negative pediatric ROS (+)  Hematology negative hematology ROS (+)   Anesthesia Other Findings Past Medical History: No date: BPH (benign prostatic hyperplasia) No date: Essential hypertension No date: GERD (gastroesophageal reflux disease) No date: Left leg weakness     Comment:  S/P stroke No date: Neuropathy No date: Seizure disorder ([place]) No date: Stroke [facility])     Comment:  had brain tumor, and had CVAs during operation for his               brain tumor  Past Surgical History: 2013: BRAIN SURGERY     Comment:  brain tumor excision 02/15/2021: INTRAMEDULLARY (IM) NAIL INTERTROCHANTERIC; Left     Comment:  Procedure: INTRAMEDULLARY (IM) NAIL INTERTROCHANTRIC;                Surgeon: Paralee Cancel, MD;  Location: [place];  Service:               Orthopedics;  Laterality: Left; No date: shunt intracranial x2  BMI    Body Mass Index: 26.96 kg/m      Reproductive/Obstetrics negative OB  ROS                             Anesthesia Physical Anesthesia Plan  ASA: 3  Anesthesia Plan: General   Post-op Pain Management:    Induction: Intravenous  PONV Risk Score and Plan: Propofol infusion and TIVA  Airway Management Planned: Natural Airway and Nasal Cannula  Additional Equipment:   Intra-op Plan:   Post-operative Plan:   Informed Consent: I have reviewed the patients History and Physical, chart, labs and discussed the procedure including the risks, benefits and alternatives for the proposed anesthesia with the patient or authorized representative who has indicated his/her understanding and acceptance.     Dental Advisory Given  Plan Discussed with: Anesthesiologist, CRNA and Surgeon  Anesthesia Plan Comments:         Anesthesia Quick Evaluation

## 2021-08-11 NOTE — Anesthesia Postprocedure Evaluation (Signed)
Anesthesia Post Note  Patient: [place]  Procedure(s) Performed: COLONOSCOPY WITH PROPOFOL  Patient location during evaluation: PACU Anesthesia Type: General Level of consciousness: awake and awake and alert Pain management: satisfactory to patient Vital Signs Assessment: post-procedure vital signs reviewed and stable Respiratory status: spontaneous breathing and respiratory function stable Cardiovascular status: blood pressure returned to baseline Anesthetic complications: no   No notable events documented.   Last Vitals:  Vitals:   08/11/21 1028 08/11/21 1038  BP: 103/68 (!) 137/91  Pulse: 70 71  Resp: 17 16  Temp:    SpO2: 94% 99%    Last Pain:  Vitals:   08/11/21 1038  TempSrc:   PainSc: 8                  VAN STAVEREN,Tangi Shroff

## 2021-08-11 NOTE — Transfer of Care (Signed)
Immediate Anesthesia Transfer of Care Note  Patient: [place]  Procedure(s) Performed: COLONOSCOPY WITH PROPOFOL  Patient Location: PACU  Anesthesia Type:General  Level of Consciousness: awake, alert  and oriented  Airway & Oxygen Therapy: Patient Spontanous Breathing  Post-op Assessment: Report given to RN and Post -op Vital signs reviewed and stable  Post vital signs: Reviewed and stable  Last Vitals:  Vitals Value Taken Time  BP 94/56 08/11/21 1020  Temp 36.4 C 08/11/21 1018  Pulse 65 08/11/21 1020  Resp 21 08/11/21 1020  SpO2 94 % 08/11/21 1020  Vitals shown include unvalidated device data.  Last Pain:  Vitals:   08/11/21 1018  TempSrc: Temporal  PainSc: Asleep         Complications: No notable events documented.

## 2021-08-11 NOTE — Op Note (Signed)
[facility] Patient Name: Roger Morton Procedure Date: 08/11/2021 9:53 AM MRN: [id number] Account #: [id number] Date of Birth: 26-Oct-1970 Admit Type: Outpatient Age: 50 Room: [facility] ENDO ROOM 4 Gender: Male Note Status: Finalized Instrument Name: Jasper Riling [id number] Procedure:             Colonoscopy Indications:           Screening for colorectal malignant neoplasm Providers:             [name] MD, MD Referring MD:          No Local Md, MD (Referring MD) Medicines:             Propofol per Anesthesia Complications:         No immediate complications. Procedure:             Pre-Anesthesia Assessment:                        - Prior to the procedure, a History and Physical was                         performed, and patient medications and allergies were                         reviewed. The patient's tolerance of previous                         anesthesia was also reviewed. The risks and benefits                         of the procedure and the sedation options and risks                         were discussed with the patient. All questions were                         answered, and informed consent was obtained. Prior                         Anticoagulants: The patient has taken no previous                         anticoagulant or antiplatelet agents. ASA Grade                         Assessment: II - A patient with mild systemic disease.                         After reviewing the risks and benefits, the patient                         was deemed in satisfactory condition to undergo the                         procedure.                        After obtaining informed consent, the colonoscope was  passed under direct vision. Throughout the procedure,                         the patient's blood pressure, pulse, and oxygen                         saturations were monitored continuously. The                          Colonoscope was introduced through the anus and                         advanced to the the cecum, identified by appendiceal                         orifice and ileocecal valve. The colonoscopy was                         performed without difficulty. The patient tolerated                         the procedure well. The quality of the bowel                         preparation was excellent. Findings:      The perianal and digital rectal examinations were normal.      A 5 mm polyp was found in the cecum. The polyp was sessile. The polyp       was removed with a cold snare. Resection and retrieval were complete.      Two sessile polyps were found in the ascending colon. The polyps were 3       to 5 mm in size. These polyps were removed with a cold snare. Resection       and retrieval were complete.      A 5 mm polyp was found in the transverse colon. The polyp was sessile.       The polyp was removed with a cold snare. Resection and retrieval were       complete.      A 5 mm polyp was found in the descending colon. The polyp was sessile.       The polyp was removed with a cold snare. Resection and retrieval were       complete.      A 5 mm polyp was found in the sigmoid colon. The polyp was sessile. The       polyp was removed with a cold snare. Resection and retrieval were       complete.      Non-bleeding internal hemorrhoids were found during retroflexion. The       hemorrhoids were Grade I (internal hemorrhoids that do not prolapse). Impression:            - One 5 mm polyp in the cecum, removed with a cold                         snare. Resected and retrieved.                        - Two 3 to 5 mm polyps in the ascending colon, removed  with a cold snare. Resected and retrieved.                        - One 5 mm polyp in the transverse colon, removed with                         a cold snare. Resected and retrieved.                        - One 5 mm polyp in  the descending colon, removed with                         a cold snare. Resected and retrieved.                        - One 5 mm polyp in the sigmoid colon, removed with a                         cold snare. Resected and retrieved.                        - Non-bleeding internal hemorrhoids. Recommendation:        - Discharge patient to home.                        - Resume previous diet.                        - Continue present medications.                        - If the pathology report reveals adenomatous tissue,                         then repeat the colonoscopy for surveillance in 3                         years. Procedure Code(s):     --- Professional ---                        [phone], Colonoscopy, flexible; with removal of                         tumor(s), polyp(s), or other lesion(s) by snare                         technique Diagnosis Code(s):     --- Professional ---                        Z12.11, Encounter for screening for malignant neoplasm                         of colon                        K63.5, Polyp of colon CPT copyright 2019 American Medical Association. All rights reserved. The codes documented in this report are preliminary and upon coder review may  be revised to meet current compliance requirements. Lovette Merta [employer]  MD, MD 08/11/2021 10:18:02 AM This report has been signed electronically. Number of Addenda: 0 Note Initiated On: 08/11/2021 9:53 AM Scope Withdrawal Time: 0 hours 11 minutes 49 seconds  Total Procedure Duration: 0 hours 15 minutes 51 seconds  Estimated Blood Loss:  Estimated blood loss: none.      [facility]

## 2021-08-12 ENCOUNTER — Encounter: Payer: Self-pay | Admitting: Gastroenterology

## 2021-08-12 LAB — SURGICAL PATHOLOGY

## 2021-08-13 ENCOUNTER — Encounter: Payer: Self-pay | Admitting: Gastroenterology

## 2021-08-21 ENCOUNTER — Ambulatory Visit
Admission: RE | Admit: 2021-08-21 | Discharge: 2021-08-21 | Disposition: A | Discharge status: Home / Self Care | Payer: Medicare Other | Source: Ambulatory Visit | Attending: Nurse Practitioner | Admitting: Nurse Practitioner

## 2021-08-21 ENCOUNTER — Other Ambulatory Visit: Payer: Self-pay

## 2021-08-21 DIAGNOSIS — R918 Other nonspecific abnormal finding of lung field: Secondary | ICD-10-CM | POA: Diagnosis not present

## 2021-09-23 ENCOUNTER — Other Ambulatory Visit: Payer: Self-pay | Admitting: Physician Assistant

## 2021-09-23 DIAGNOSIS — R569 Unspecified convulsions: Secondary | ICD-10-CM

## 2021-09-23 DIAGNOSIS — R200 Anesthesia of skin: Secondary | ICD-10-CM

## 2021-09-23 DIAGNOSIS — Z87898 Personal history of other specified conditions: Secondary | ICD-10-CM

## 2021-09-23 DIAGNOSIS — R531 Weakness: Secondary | ICD-10-CM

## 2021-10-05 ENCOUNTER — Other Ambulatory Visit: Payer: Self-pay

## 2021-10-05 ENCOUNTER — Ambulatory Visit
Admission: RE | Admit: 2021-10-05 | Discharge: 2021-10-05 | Disposition: A | Discharge status: Home / Self Care | Payer: Medicare Other | Source: Ambulatory Visit | Attending: Physician Assistant | Admitting: Physician Assistant

## 2021-10-05 DIAGNOSIS — R200 Anesthesia of skin: Secondary | ICD-10-CM | POA: Diagnosis present

## 2021-10-05 DIAGNOSIS — R531 Weakness: Secondary | ICD-10-CM | POA: Diagnosis present

## 2021-10-05 DIAGNOSIS — R569 Unspecified convulsions: Secondary | ICD-10-CM | POA: Diagnosis present

## 2021-10-05 DIAGNOSIS — Z87898 Personal history of other specified conditions: Secondary | ICD-10-CM | POA: Insufficient documentation

## 2021-10-05 MED ORDER — GADOBUTROL 1 MMOL/ML IV SOLN
9.0000 mL | Freq: Once | INTRAVENOUS | Status: AC | PRN
  Administered 2021-10-05: 9 mL via INTRAVENOUS

## 2021-10-21 ENCOUNTER — Other Ambulatory Visit: Payer: Self-pay | Admitting: Physician Assistant

## 2022-05-28 ENCOUNTER — Ambulatory Visit (INDEPENDENT_AMBULATORY_CARE_PROVIDER_SITE_OTHER): Payer: 59 | Admitting: Urology

## 2022-05-28 ENCOUNTER — Encounter: Payer: Self-pay | Admitting: Urology

## 2022-05-28 VITALS — BP 128/70 | HR 72 | Ht 74.0 in | Wt 279.0 lb

## 2022-05-28 DIAGNOSIS — N3281 Overactive bladder: Secondary | ICD-10-CM

## 2022-05-28 LAB — MICROSCOPIC EXAMINATION

## 2022-05-28 LAB — URINALYSIS, COMPLETE
Bilirubin, UA: NEGATIVE
Glucose, UA: NEGATIVE
Ketones, UA: NEGATIVE
Leukocytes,UA: NEGATIVE
Nitrite, UA: NEGATIVE
Protein,UA: NEGATIVE
RBC, UA: NEGATIVE
Specific Gravity, UA: 1.015 (ref 1.005–1.030)
Urobilinogen, Ur: 0.2 mg/dL (ref 0.2–1.0)
pH, UA: 5 (ref 5.0–7.5)

## 2022-05-28 LAB — BLADDER SCAN AMB NON-IMAGING: Scan Result: 0

## 2022-05-28 MED ORDER — GEMTESA 75 MG PO TABS: 75.0000 mg | ORAL_TABLET | Freq: Every day | ORAL | 0 refills | Status: DC

## 2022-05-28 NOTE — Progress Notes (Addendum)
05/28/2022 2:05 PM   Roger Morton [birth year] [id number]  Referring provider: Noreene Larsson, NP [address] [place],  [place] [address]  Chief Complaint  Patient presents with   Urinary Frequency    HPI: Roger Morton is a 51 y.o. male referred from his SNF for evaluation of urinary frequency.  No notes from his SNF for forwarded  4 month history of urinary frequency, voiding small amounts and straining to urinate.  Mild dysuria at times.  Denies urinary incontinence Is on tamsulosin without improvement in symptoms.  He is not sure how long he has taken the medication Past history significant for previous brain tumor with postoperative CVA after surgery   PMH: Past Medical History:  Diagnosis Date   BPH (benign prostatic hyperplasia)    Essential hypertension    GERD (gastroesophageal reflux disease)    Left leg weakness    S/P stroke   Neuropathy    Seizure disorder ([place])    Stroke ([facility])    had brain tumor, and had CVAs during operation for his brain tumor    Surgical History: Past Surgical History:  Procedure Laterality Date   BRAIN SURGERY  2013   brain tumor excision   COLONOSCOPY WITH PROPOFOL N/A 08/11/2021   Procedure: COLONOSCOPY WITH PROPOFOL;  Surgeon: [name], MD;  Location: [facility] ENDOSCOPY;  Service: Endoscopy;  Laterality: N/A;   INTRAMEDULLARY (IM) NAIL INTERTROCHANTERIC Left 02/15/2021   Procedure: INTRAMEDULLARY (IM) NAIL INTERTROCHANTRIC;  Surgeon: Paralee Cancel, MD;  Location: [facility];  Service: Orthopedics;  Laterality: Left;   shunt intracranial x2      Home Medications:  Allergies as of 05/28/2022       Reactions   Penicillins         Medication List        Accurate as of May 28, 2022  2:05 PM. If you have any questions, ask your nurse or doctor.          Acetaminophen Extra Strength 500 MG Tabs Take 500 mg by mouth every 8 (eight) hours.   atorvastatin 40 MG tablet Commonly known as: LIPITOR Take 40 mg by  mouth at bedtime.   Baclofen 5 MG Tabs SMARTSIG:3 Tablet(s) By Mouth Every 8 Hours PRN   bisacodyl 10 MG suppository Commonly known as: DULCOLAX Place 1 suppository (10 mg total) rectally daily as needed for moderate constipation.   busPIRone 5 MG tablet Commonly known as: BUSPAR Take 1 tablet (5 mg total) by mouth 2 (two) times daily.   celecoxib 200 MG capsule Commonly known as: CELEBREX Take 1 capsule (200 mg total) by mouth daily.   CLEAR EYES FOR DRY EYES OP Place 1 drop into the left eye in the morning and at bedtime.   docusate sodium 100 MG capsule Commonly known as: COLACE Take 1 capsule (100 mg total) by mouth daily. What changed: when to take this   fluticasone 50 MCG/ACT nasal spray Commonly known as: FLONASE Place into both nostrils.   gabapentin 100 MG capsule Commonly known as: NEURONTIN Take 1 capsule (100 mg total) by mouth at bedtime.   Gemtesa 75 MG Tabs Generic drug: Vibegron Take 75 mg by mouth daily. Started by: Abbie Sons, MD   hydrochlorothiazide 25 MG tablet Commonly known as: HYDRODIURIL Take 1 tablet (25 mg total) by mouth daily.   hydrOXYzine 25 MG capsule Commonly known as: VISTARIL Take 25 mg by mouth 3 (three) times daily.   ketoconazole 2 % cream Commonly known as:  NIZORAL Apply 1 application topically daily.   levETIRAcetam 250 MG tablet Commonly known as: KEPPRA Take by mouth.   lidocaine 4 % Apply 1 patch topically daily.   Melatonin 5 MG Caps Take 10 mg by mouth at bedtime.   menthol-cetylpyridinium 3 MG lozenge Commonly known as: CEPACOL Take 1 lozenge (3 mg total) by mouth as needed for sore throat.   mupirocin ointment 2 % Commonly known as: BACTROBAN Place 1 application into the nose 2 (two) times daily.   nicotine 14 mg/24hr patch Commonly known as: NICODERM CQ - dosed in mg/24 hours Place 1 patch (14 mg total) onto the skin daily.   omeprazole 20 MG capsule Commonly known as: PRILOSEC Take 1  capsule (20 mg total) by mouth daily.   ondansetron 4 MG tablet Commonly known as: ZOFRAN Take 1 tablet (4 mg total) by mouth every 6 (six) hours as needed for nausea.   oxyCODONE 5 MG immediate release tablet Commonly known as: Oxy IR/ROXICODONE Take 1-2 tablets (5-10 mg total) by mouth every 6 (six) hours as needed for severe pain.   polyethylene glycol 17 g packet Commonly known as: MIRALAX / GLYCOLAX Take 17 g by mouth daily as needed for mild constipation.   promethazine 12.5 MG tablet Commonly known as: PHENERGAN Take 1 tablet (12.5 mg total) by mouth every 8 (eight) hours as needed for nausea or vomiting.   senna-docusate 8.6-50 MG tablet Commonly known as: Senokot-S Take 2 tablets by mouth at bedtime.   tamsulosin 0.4 MG Caps capsule Commonly known as: FLOMAX Take 0.4 mg by mouth in the morning and at bedtime.   traZODone 150 MG tablet Commonly known as: DESYREL Take 1 tablet (150 mg total) by mouth at bedtime.   triamcinolone cream 0.1 % Commonly known as: KENALOG Apply 1 application topically 2 (two) times daily.        Allergies:  Allergies  Allergen Reactions   Penicillins     Family History: Family History  Problem Relation Age of Onset   CAD Neg Hx     Social History:  reports that he has been smoking cigarettes. He has a 37.50 pack-year smoking history. He has never used smokeless tobacco. He reports that he does not currently use alcohol. He reports that he does not currently use drugs.   Physical Exam: BP 128/70   Pulse 72   Ht '6\' 2"'$  (1.88 m)   Wt 279 lb (126.6 kg)   BMI 35.82 kg/m   Constitutional:  Alert, No acute distress. HEENT: [place] AT Respiratory: Normal respiratory effort, no increased work of breathing. Psychiatric: Normal mood and affect.  Laboratory Data:  Urinalysis Dipstick/microscopy negative  Assessment & Plan:    1.  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

## 2022-06-15 ENCOUNTER — Encounter: Payer: 59 | Admitting: Internal Medicine

## 2022-06-30 ENCOUNTER — Ambulatory Visit (INDEPENDENT_AMBULATORY_CARE_PROVIDER_SITE_OTHER): Payer: 59 | Admitting: Urology

## 2022-06-30 ENCOUNTER — Encounter: Payer: Self-pay | Admitting: Urology

## 2022-06-30 VITALS — BP 128/74 | HR 74 | Ht 74.0 in | Wt 179.0 lb

## 2022-06-30 DIAGNOSIS — N3281 Overactive bladder: Secondary | ICD-10-CM

## 2022-06-30 LAB — BLADDER SCAN AMB NON-IMAGING: Scan Result: 11

## 2022-06-30 MED ORDER — MIRABEGRON ER 25 MG PO TB24: 25.0000 mg | ORAL_TABLET | Freq: Every day | ORAL | 0 refills | Status: DC

## 2022-07-01 ENCOUNTER — Encounter: Payer: Self-pay | Admitting: Urology

## 2022-07-01 NOTE — Progress Notes (Signed)
06/30/2022 7:59 AM   Roger Morton 1971/08/01 [id number]  Referring provider: Noreene Larsson, NP [address] [place],  [place] [address]  Chief Complaint  Patient presents with   Over Active Bladder    HPI: 51 y.o. male presents for 1 month follow-up visit.  Initially seen 05/28/2022 for frequency, voiding small amounts and straining to urinate.  On tamsulosin without improvement in symptoms. Past history significant for previous brain tumor with postoperative CVA after surgery Given a trial of Gemtesa which he feels may have helped his symptoms slightly Remains on tamsulosin   PMH: Past Medical History:  Diagnosis Date   BPH (benign prostatic hyperplasia)    Essential hypertension    GERD (gastroesophageal reflux disease)    Left leg weakness    S/P stroke   Neuropathy    Seizure disorder ([place])    Stroke ([place])    had brain tumor, and had CVAs during operation for his brain tumor    Surgical History: Past Surgical History:  Procedure Laterality Date   BRAIN SURGERY  2013   brain tumor excision   COLONOSCOPY WITH PROPOFOL N/A 08/11/2021   Procedure: COLONOSCOPY WITH PROPOFOL;  Surgeon: [name], MD;  Location: [facility] ENDOSCOPY;  Service: Endoscopy;  Laterality: N/A;   INTRAMEDULLARY (IM) NAIL INTERTROCHANTERIC Left 02/15/2021   Procedure: INTRAMEDULLARY (IM) NAIL INTERTROCHANTRIC;  Surgeon: Paralee Cancel, MD;  Location: [facility];  Service: Orthopedics;  Laterality: Left;   shunt intracranial x2      Home Medications:  Allergies as of 06/30/2022       Reactions   Penicillins         Medication List        Accurate as of June 30, 2022 11:59 PM. If you have any questions, ask your nurse or doctor.          STOP taking these medications    Gemtesa 75 MG Tabs Generic drug: Vibegron Stopped by: Abbie Sons, MD       TAKE these medications    Acetaminophen Extra Strength 500 MG Tabs Take 500 mg by mouth every 8 (eight) hours.    atorvastatin 40 MG tablet Commonly known as: LIPITOR Take 40 mg by mouth at bedtime.   Baclofen 5 MG Tabs SMARTSIG:3 Tablet(s) By Mouth Every 8 Hours PRN   bisacodyl 10 MG suppository Commonly known as: DULCOLAX Place 1 suppository (10 mg total) rectally daily as needed for moderate constipation.   busPIRone 5 MG tablet Commonly known as: BUSPAR Take 1 tablet (5 mg total) by mouth 2 (two) times daily.   celecoxib 200 MG capsule Commonly known as: CELEBREX Take 1 capsule (200 mg total) by mouth daily.   CLEAR EYES FOR DRY EYES OP Place 1 drop into the left eye in the morning and at bedtime.   docusate sodium 100 MG capsule Commonly known as: COLACE Take 1 capsule (100 mg total) by mouth daily. What changed: when to take this   fluticasone 50 MCG/ACT nasal spray Commonly known as: FLONASE Place into both nostrils.   gabapentin 100 MG capsule Commonly known as: NEURONTIN Take 1 capsule (100 mg total) by mouth at bedtime.   hydrochlorothiazide 25 MG tablet Commonly known as: HYDRODIURIL Take 1 tablet (25 mg total) by mouth daily.   hydrOXYzine 25 MG capsule Commonly known as: VISTARIL Take 25 mg by mouth 3 (three) times daily.   ketoconazole 2 % cream Commonly known as: NIZORAL Apply 1 application topically daily.   levETIRAcetam 250  MG tablet Commonly known as: KEPPRA Take by mouth.   lidocaine 4 % Apply 1 patch topically daily.   Melatonin 5 MG Caps Take 10 mg by mouth at bedtime.   menthol-cetylpyridinium 3 MG lozenge Commonly known as: CEPACOL Take 1 lozenge (3 mg total) by mouth as needed for sore throat.   mirabegron ER 25 MG Tb24 tablet Commonly known as: MYRBETRIQ Take 1 tablet (25 mg total) by mouth daily. Started by: Abbie Sons, MD   mupirocin ointment 2 % Commonly known as: BACTROBAN Place 1 application into the nose 2 (two) times daily.   nicotine 14 mg/24hr patch Commonly known as: NICODERM CQ - dosed in mg/24 hours Place 1 patch  (14 mg total) onto the skin daily.   omeprazole 20 MG capsule Commonly known as: PRILOSEC Take 1 capsule (20 mg total) by mouth daily.   ondansetron 4 MG tablet Commonly known as: ZOFRAN Take 1 tablet (4 mg total) by mouth every 6 (six) hours as needed for nausea.   oxyCODONE 5 MG immediate release tablet Commonly known as: Oxy IR/ROXICODONE Take 1-2 tablets (5-10 mg total) by mouth every 6 (six) hours as needed for severe pain.   polyethylene glycol 17 g packet Commonly known as: MIRALAX / GLYCOLAX Take 17 g by mouth daily as needed for mild constipation.   promethazine 12.5 MG tablet Commonly known as: PHENERGAN Take 1 tablet (12.5 mg total) by mouth every 8 (eight) hours as needed for nausea or vomiting.   senna-docusate 8.6-50 MG tablet Commonly known as: Senokot-S Take 2 tablets by mouth at bedtime.   tamsulosin 0.4 MG Caps capsule Commonly known as: FLOMAX Take 0.4 mg by mouth in the morning and at bedtime.   traZODone 150 MG tablet Commonly known as: DESYREL Take 1 tablet (150 mg total) by mouth at bedtime.   triamcinolone cream 0.1 % Commonly known as: KENALOG Apply 1 application topically 2 (two) times daily.        Allergies:  Allergies  Allergen Reactions   Penicillins     Family History: Family History  Problem Relation Age of Onset   CAD Neg Hx     Social History:  reports that he has been smoking cigarettes. He has a 37.50 pack-year smoking history. He has never used smokeless tobacco. He reports that he does not currently use alcohol. He reports that he does not currently use drugs.   Physical Exam: BP 128/74   Pulse 74   Ht '6\' 2"'$  (1.88 m)   Wt 179 lb (81.2 kg)   BMI 22.98 kg/m   Constitutional:  Alert, No acute distress. HEENT: [place] AT Respiratory: Normal respiratory effort, no increased work of breathing. Psychiatric: Normal mood and affect.  Laboratory Data:  Urinalysis Dipstick/microscopy negative  Assessment & Plan:    1.   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

## 2022-08-11 ENCOUNTER — Other Ambulatory Visit: Payer: Self-pay | Admitting: Neurology

## 2022-08-11 DIAGNOSIS — D496 Neoplasm of unspecified behavior of brain: Secondary | ICD-10-CM

## 2022-08-11 DIAGNOSIS — R42 Dizziness and giddiness: Secondary | ICD-10-CM

## 2022-08-11 DIAGNOSIS — Z8673 Personal history of transient ischemic attack (TIA), and cerebral infarction without residual deficits: Secondary | ICD-10-CM

## 2022-08-11 DIAGNOSIS — R569 Unspecified convulsions: Secondary | ICD-10-CM

## 2022-08-11 DIAGNOSIS — Z8616 Personal history of COVID-19: Secondary | ICD-10-CM

## 2022-08-11 HISTORY — DX: Personal history of COVID-19: Z86.16

## 2022-08-26 ENCOUNTER — Ambulatory Visit
Admission: RE | Admit: 2022-08-26 | Discharge: 2022-08-26 | Disposition: A | Discharge status: Home / Self Care | Payer: Medicare Other | Source: Ambulatory Visit | Attending: Neurology | Admitting: Neurology

## 2022-08-26 DIAGNOSIS — D496 Neoplasm of unspecified behavior of brain: Secondary | ICD-10-CM | POA: Insufficient documentation

## 2022-08-26 DIAGNOSIS — R569 Unspecified convulsions: Secondary | ICD-10-CM | POA: Diagnosis present

## 2022-08-26 DIAGNOSIS — R42 Dizziness and giddiness: Secondary | ICD-10-CM | POA: Diagnosis present

## 2022-08-26 DIAGNOSIS — Z8673 Personal history of transient ischemic attack (TIA), and cerebral infarction without residual deficits: Secondary | ICD-10-CM | POA: Diagnosis present

## 2022-08-26 MED ORDER — GADOBUTROL 1 MMOL/ML IV SOLN
7.5000 mL | Freq: Once | INTRAVENOUS | Status: AC | PRN
  Administered 2022-08-26: 7.5 mL via INTRAVENOUS

## 2022-10-14 NOTE — H&P (Signed)
Patient's anticipated LOS is less than 2 midnights, meeting these requirements: - Younger than 31 - Lives within 1 hour of care - Has a competent adult at home to recover with post-op recover - NO history of  - Chronic pain requiring opiods  - Diabetes  - Coronary Artery Disease  - Heart failure  - Heart attack  - Stroke  - DVT/VTE  - Cardiac arrhythmia  - Respiratory Failure/COPD  - Renal failure  - Anemia  - Advanced Liver disease     Roger Morton is an 52 y.o. male.    Chief Complaint: right shoulder pain  HPI: Pt is a 52 y.o. male complaining of right shoulder pain for multiple years. Pain had continually increased since the beginning. X-rays in the clinic show end-stage arthritic changes of the right shoulder. Pt has tried various conservative treatments which have failed to alleviate their symptoms, including injections and therapy. Various options are discussed with the patient. Risks, benefits and expectations were discussed with the patient. Patient understand the risks, benefits and expectations and wishes to proceed with surgery.   PCP:  Noreene Larsson, NP  D/C Plans: Home  PMH: Past Medical History:  Diagnosis Date   BPH (benign prostatic hyperplasia)    Essential hypertension    GERD (gastroesophageal reflux disease)    Left leg weakness    S/P stroke   Neuropathy    Seizure disorder ([place])    Stroke ([place])    had brain tumor, and had CVAs during operation for his brain tumor    PSH: Past Surgical History:  Procedure Laterality Date   BRAIN SURGERY  2013   brain tumor excision   COLONOSCOPY WITH PROPOFOL N/A 08/11/2021   Procedure: COLONOSCOPY WITH PROPOFOL;  Surgeon: [name], MD;  Location: [facility] ENDOSCOPY;  Service: Endoscopy;  Laterality: N/A;   INTRAMEDULLARY (IM) NAIL INTERTROCHANTERIC Left 02/15/2021   Procedure: INTRAMEDULLARY (IM) NAIL INTERTROCHANTRIC;  Surgeon: Paralee Cancel, MD;  Location: [facility];  Service: Orthopedics;  Laterality:  Left;   shunt intracranial x2      Social History:  reports that he has been smoking cigarettes. He has a 37.50 pack-year smoking history. He has never used smokeless tobacco. He reports that he does not currently use alcohol. He reports that he does not currently use drugs. BMI: There is no height or weight on file to calculate BMI.  Lab Results  Component Value Date   ALBUMIN 3.0 (L) 02/20/2021   Diabetes: Patient does not have a diagnosis of diabetes.     Smoking Status: Social History   Tobacco Use  Smoking Status Every Day   Packs/day: 1.50   Years: 25.00   Total pack years: 37.50   Types: Cigarettes  Smokeless Tobacco Never   Ready to quit: Not Answered Counseling given: Not Answered  The patient has participated in a 4-week cessation program.          Allergies:  Allergies  Allergen Reactions   Penicillins     Medications: No current facility-administered medications for this encounter.   Current Outpatient Medications  Medication Sig Dispense Refill   ACETAMINOPHEN EXTRA STRENGTH 500 MG tablet Take 500 mg by mouth every 8 (eight) hours.     atorvastatin (LIPITOR) 40 MG tablet Take 40 mg by mouth at bedtime.     Baclofen 5 MG TABS SMARTSIG:3 Tablet(s) By Mouth Every 8 Hours PRN     bisacodyl (DULCOLAX) 10 MG suppository Place 1 suppository (10 mg total) rectally daily as  needed for moderate constipation. 12 suppository 0   busPIRone (BUSPAR) 5 MG tablet Take 1 tablet (5 mg total) by mouth 2 (two) times daily. 60 tablet 3   Carboxymethylcellul-Glycerin (CLEAR EYES FOR DRY EYES OP) Place 1 drop into the left eye in the morning and at bedtime.     celecoxib (CELEBREX) 200 MG capsule Take 1 capsule (200 mg total) by mouth daily. 30 capsule 3   docusate sodium (COLACE) 100 MG capsule Take 1 capsule (100 mg total) by mouth daily. (Patient taking differently: Take 100 mg by mouth 2 (two) times daily.) 10 capsule 3   fluticasone (FLONASE) 50 MCG/ACT nasal spray  Place into both nostrils.     gabapentin (NEURONTIN) 100 MG capsule Take 1 capsule (100 mg total) by mouth at bedtime. 30 capsule 3   hydrochlorothiazide (HYDRODIURIL) 25 MG tablet Take 1 tablet (25 mg total) by mouth daily. 30 tablet 3   hydrOXYzine (VISTARIL) 25 MG capsule Take 25 mg by mouth 3 (three) times daily.     ketoconazole (NIZORAL) 2 % cream Apply 1 application topically daily. 15 g 0   levETIRAcetam (KEPPRA) 250 MG tablet Take by mouth.     Lidocaine 4 % PTCH Apply 1 patch topically daily.     Melatonin 5 MG CAPS Take 10 mg by mouth at bedtime.     menthol-cetylpyridinium (CEPACOL) 3 MG lozenge Take 1 lozenge (3 mg total) by mouth as needed for sore throat. 100 tablet 12   mirabegron ER (MYRBETRIQ) 25 MG TB24 tablet Take 1 tablet (25 mg total) by mouth daily. 28 tablet 0   mupirocin ointment (BACTROBAN) 2 % Place 1 application into the nose 2 (two) times daily. 22 g 0   nicotine (NICODERM CQ - DOSED IN MG/24 HOURS) 14 mg/24hr patch Place 1 patch (14 mg total) onto the skin daily. 28 patch 0   omeprazole (PRILOSEC) 20 MG capsule Take 1 capsule (20 mg total) by mouth daily. 30 capsule 3   ondansetron (ZOFRAN) 4 MG tablet Take 1 tablet (4 mg total) by mouth every 6 (six) hours as needed for nausea. 20 tablet 0   oxyCODONE (OXY IR/ROXICODONE) 5 MG immediate release tablet Take 1-2 tablets (5-10 mg total) by mouth every 6 (six) hours as needed for severe pain. 42 tablet 0   polyethylene glycol (MIRALAX / GLYCOLAX) 17 g packet Take 17 g by mouth daily as needed for mild constipation. 14 each 0   promethazine (PHENERGAN) 12.5 MG tablet Take 1 tablet (12.5 mg total) by mouth every 8 (eight) hours as needed for nausea or vomiting. 20 tablet 0   senna-docusate (SENOKOT-S) 8.6-50 MG tablet Take 2 tablets by mouth at bedtime. 60 tablet 0   tamsulosin (FLOMAX) 0.4 MG CAPS capsule Take 0.4 mg by mouth in the morning and at bedtime.     traZODone (DESYREL) 150 MG tablet Take 1 tablet (150 mg total)  by mouth at bedtime. 90 tablet 3   triamcinolone cream (KENALOG) 0.1 % Apply 1 application topically 2 (two) times daily. 30 g 0    No results found for this or any previous visit (from the past 48 hour(s)). No results found.  ROS: Pain with rom of the right upper extremity  Physical Exam: Alert and oriented 52 y.o.  male in no acute distress Cranial nerves 2-12 intact Cervical spine: full rom with no tenderness, nv intact distally Chest: active breath sounds bilaterally, no wheeze rhonchi or rales Heart: regular rate and rhythm, no murmur  Abd: non tender non distended with active bowel sounds Hip is stable with rom  Right shoulder painful and weak rom Nv intact distally No rashes or edema distally  Assessment/Plan Assessment: right shoulder cuff arthropathy  Plan:  Patient will undergo a right reverse total shoulder by Dr. Veverly Fells at [place] Risks benefits and expectations were discussed with the patient. Patient understand risks, benefits and expectations and wishes to proceed. Preoperative templating of the joint replacement has been completed, documented, and submitted to the Operating Room personnel in order to optimize intra-operative equipment management.   Merla Riches PA-C, MPAS [facility] is now [employer] [address], [place], [place], [place] [address] Phone: [phone] [email or web address]  [employer]

## 2022-10-25 ENCOUNTER — Encounter (HOSPITAL_COMMUNITY): Payer: Self-pay | Admitting: Orthopedic Surgery

## 2022-10-25 NOTE — Patient Instructions (Addendum)
Preop instructions for:  Roger Morton. Roger Morton    Date of Birth: Jan 15, 1971                       Date of Procedure: Friday, October 29, 2022   Procedure:  RIGHT  REVERSE SHOULDER ARTHROPLASTY      Surgeon: Dr. Netta Cedars Facility contact: [place]   Phone: [place]: RN contact name/phone#:                          and Fax #: [phone]   Transportation contact phone#: [place] [phone]   Please send day of procedure:  Current med list  Medications taken the day of procedure (return attached form to hospital) confirm time of nothing by mouth status (return attached form to hospital) Patient Demographic info( to include DNR status, problem list, allergies) Bring Insurance card and picture ID    Time to arrive at [facility]: 9:15 AM   Report to: Admitting (On your left hand side)    Do not eat solid food past midnight the night before your procedure.(To include any tube feedings-must be discontinued)  May have the following until 9:00 AM day of procedure  CLEAR LIQUID DIET  Water Black Coffee (sugar ok, NO MILK/CREAM OR CREAMERS)  Tea (sugar ok, NO MILK/CREAM OR CREAMERS) regular and decaf                             Plain Jell-O (NO RED)                                           Fruit ices (not with fruit pulp, NO RED)                                     Popsicles (NO RED)                                                                  Juice: apple, WHITE grape, WHITE cranberry Sports drinks like Gatorade (NO RED)  STOP ASPIRIN AS DIRECTED BY DR. Veverly Fells OFFICE!!   Take these morning medications only with sips of water.(or give through gastrostomy or feeding tube).   Baclofen                Buspirone                  Flomax                Gabapentin                Lipitor                Myrbetriq                Pantoprazole  Propranolol                Zoloft                Sennoside Docusate                May use nasal sprays    Note: Stop Smoking if unable to stop now, DO NOT SMOKE AFTER MIDNIGHT BEFORE SURGERY  Oral Hygiene is also important to reduce your risk of infection.                                    Remember - BRUSH YOUR TEETH THE MORNING OF SURGERY WITH YOUR REGULAR TOOTHPASTE   DENTURES WILL BE REMOVED PRIOR TO SURGERY PLEASE DO NOT APPLY "Poly grip" OR ADHESIVES!!!  Leave all jewelry and other valuables at place where living( no metal or rings to be worn) No contact lens  Men-no colognes,lotions   Any questions day of procedure,call  SHORT STAY-[phone]     Sent from :[facility]                   Phone:[phone]                   [phone]   Sent by :  Latrease Kunde BSN, RN

## 2022-10-25 NOTE — Progress Notes (Signed)
For Anesthesia: PCP - Dr. Erik Obey Cardiologist - Sherren Mocha, MD   Chest x-ray - greater than 1 year in [facility] EKG - greater than 1 year in [facility] Stress Test -N/A  ECHO - greater than 2 years in [facility] Cardiac Cath - N/A  Pacemaker/ICD device last checked: N/A  Pacemaker orders received: N/A  Device Rep notified: N/A   Spinal Cord Stimulator: N/A    Sleep Study - N/A  CPAP -  N/A   Fasting Blood Sugar - N/A  Checks Blood Sugar __N/A ___ times a day Date and result of last Hgb A1c-N/A   Last dose of GLP1 agonist- N/A  GLP1 instructions: N/A   Last dose of SGLT-2 inhibitors- N/A  SGLT-2 instructions:N/A   Blood Thinner Instructions: N/A  Aspirin Instructions: Per surgeon's request Last Dose:  Activity level: Wheelchair bound     Anesthesia review: HTN, hisotry of Stroke, seizures, dysphagia, facial paralysis, hemiplegia, hemiparesis  Patient denies shortness of breath, fever, cough and chest pain at PAT appointment   Patient verbalized understanding of instructions reviewed via telephone.

## 2022-10-27 NOTE — Progress Notes (Signed)
Anesthesia Chart Review   Case: [id number] Date/Time: 10/29/22 1159   Procedure: REVERSE SHOULDER ARTHROPLASTY (Right: Shoulder)   Anesthesia type: Choice   Pre-op diagnosis: right shoulder avascular necrosis   Location: [facility] ROOM 06 / [facility]   Surgeons: Netta Cedars, MD       DISCUSSION:52 y.o. smoker with h/o HTN, stroke, brain tumor s/p shunt, seizure, right shoulder AVN scheduled for above procedure 10/29/2022 with Dr. Netta Cedars.   Pt last seen by neurology 09/15/2022. Per OV note MRI January stable, no new seizures. 4 month follow up recommended.  VS: Ht '6\' 2"'$  (1.88 m)   Wt 86.9 kg   BMI 24.60 kg/m   PROVIDERS: Noreene Larsson, NP   LABS: Labs reviewed: Acceptable for surgery. (all labs ordered are listed, but only abnormal results are displayed)  Labs Reviewed - No data to display   IMAGES:   EKG:   CV: Echo 02/19/2021 1. Left ventricular ejection fraction, by estimation, is 60 to 65%. The  left ventricle has normal function. The left ventricle has no regional  wall motion abnormalities. There is mild concentric left ventricular  hypertrophy. Left ventricular diastolic  parameters were normal.   2. Right ventricular systolic function is normal. The right ventricular  size is normal. Tricuspid regurgitation signal is inadequate for assessing  PA pressure.   3. The mitral valve is grossly normal. No evidence of mitral valve  regurgitation. No evidence of mitral stenosis.   4. The aortic valve is tricuspid. Aortic valve regurgitation is not  visualized. No aortic stenosis is present.   5. The inferior vena cava is normal in size with greater than 50%  respiratory variability, suggesting right atrial pressure of 3 mmHg.  Past Medical History:  Diagnosis Date   Anemia    Anxiety    BPH (benign prostatic hyperplasia)    Chronic pain    Depression    Dysphagia    Essential hypertension    Essential tremor    Facial paralysis on left side    GERD  (gastroesophageal reflux disease)    Hemiplegia and hemiparesis following cerebral infarction affecting left non-dominant side (HCC)    History of brain tumor    History of COVID-19 08/11/2022   Hyperlipidemia    Insomnia    Left leg weakness    S/P stroke   Neuropathy    OA (osteoarthritis)    OAB (overactive bladder)    Seizure disorder ([place])    Sinus tachycardia    Stroke [facility])    had brain tumor, and had CVAs during operation for his brain tumor    Past Surgical History:  Procedure Laterality Date   BRAIN SURGERY  2013   brain tumor excision   COLONOSCOPY WITH PROPOFOL N/A 08/11/2021   Procedure: COLONOSCOPY WITH PROPOFOL;  Surgeon: [name], MD;  Location: [facility] ENDOSCOPY;  Service: Endoscopy;  Laterality: N/A;   INTRAMEDULLARY (IM) NAIL INTERTROCHANTERIC Left 02/15/2021   Procedure: INTRAMEDULLARY (IM) NAIL INTERTROCHANTRIC;  Surgeon: Paralee Cancel, MD;  Location: [place];  Service: Orthopedics;  Laterality: Left;   shunt intracranial x2      MEDICATIONS: No current facility-administered medications for this encounter.    aspirin 81 MG chewable tablet   atorvastatin (LIPITOR) 40 MG tablet   azelastine (ASTELIN) 0.1 % nasal spray   bacitracin 500 UNIT/GM ointment   Baclofen 5 MG TABS   busPIRone (BUSPAR) 10 MG tablet   carboxymethylcellulose (ARTIFICIAL TEARS) 1 % ophthalmic solution   celecoxib (CELEBREX) 200 MG  capsule   fluticasone (FLONASE) 50 MCG/ACT nasal spray   gabapentin (NEURONTIN) 100 MG capsule   Lidocaine 4 % PTCH   melatonin 5 MG TABS   mirabegron ER (MYRBETRIQ) 25 MG TB24 tablet   nortriptyline (PAMELOR) 10 MG capsule   Omega-3 Fatty Acids (FISH OIL) 1000 MG CAPS   oxycodone (OXY-IR) 5 MG capsule   pantoprazole (PROTONIX) 40 MG tablet   Polyethyl Glycol-Propyl Glycol (SYSTANE) 0.4-0.3 % SOLN   propranolol (INDERAL) 10 MG tablet   QUEtiapine (SEROQUEL) 200 MG tablet   senna-docusate (SENOKOT-S) 8.6-50 MG tablet   sertraline (ZOLOFT) 50 MG tablet    tamsulosin (FLOMAX) 0.4 MG CAPS capsule    Konrad Felix Ward, PA-C [facility] [phone]

## 2022-10-29 ENCOUNTER — Other Ambulatory Visit: Payer: Self-pay

## 2022-10-29 ENCOUNTER — Ambulatory Visit (HOSPITAL_BASED_OUTPATIENT_CLINIC_OR_DEPARTMENT_OTHER): Payer: 59 | Admitting: Physician Assistant

## 2022-10-29 ENCOUNTER — Encounter (HOSPITAL_COMMUNITY): Payer: Self-pay | Admitting: Orthopedic Surgery

## 2022-10-29 ENCOUNTER — Encounter (HOSPITAL_COMMUNITY)
Admission: RE | Disposition: A | Discharge status: Skilled Nursing Facility | Payer: Self-pay | Source: Home / Self Care | Attending: Orthopedic Surgery

## 2022-10-29 ENCOUNTER — Ambulatory Visit (HOSPITAL_COMMUNITY): Payer: 59 | Admitting: Physician Assistant

## 2022-10-29 ENCOUNTER — Ambulatory Visit (HOSPITAL_COMMUNITY): Payer: 59

## 2022-10-29 ENCOUNTER — Observation Stay (HOSPITAL_COMMUNITY)
Admission: RE | Admit: 2022-10-29 | Discharge: 2022-10-30 | Disposition: A | Discharge status: Skilled Nursing Facility | Payer: 59 | Attending: Orthopedic Surgery | Admitting: Orthopedic Surgery

## 2022-10-29 DIAGNOSIS — R2681 Unsteadiness on feet: Secondary | ICD-10-CM | POA: Diagnosis not present

## 2022-10-29 DIAGNOSIS — M879 Osteonecrosis, unspecified: Secondary | ICD-10-CM

## 2022-10-29 DIAGNOSIS — M87851 Other osteonecrosis, right femur: Secondary | ICD-10-CM | POA: Diagnosis present

## 2022-10-29 DIAGNOSIS — Z8673 Personal history of transient ischemic attack (TIA), and cerebral infarction without residual deficits: Secondary | ICD-10-CM | POA: Insufficient documentation

## 2022-10-29 DIAGNOSIS — Z96611 Presence of right artificial shoulder joint: Secondary | ICD-10-CM

## 2022-10-29 DIAGNOSIS — I10 Essential (primary) hypertension: Secondary | ICD-10-CM | POA: Insufficient documentation

## 2022-10-29 DIAGNOSIS — F418 Other specified anxiety disorders: Secondary | ICD-10-CM

## 2022-10-29 DIAGNOSIS — Z8616 Personal history of COVID-19: Secondary | ICD-10-CM | POA: Diagnosis not present

## 2022-10-29 DIAGNOSIS — Z79899 Other long term (current) drug therapy: Secondary | ICD-10-CM | POA: Insufficient documentation

## 2022-10-29 DIAGNOSIS — F17210 Nicotine dependence, cigarettes, uncomplicated: Secondary | ICD-10-CM | POA: Insufficient documentation

## 2022-10-29 HISTORY — PX: REVERSE SHOULDER ARTHROPLASTY: SHX5054

## 2022-10-29 HISTORY — DX: Essential tremor: G25.0

## 2022-10-29 HISTORY — DX: Hyperlipidemia, unspecified: E78.5

## 2022-10-29 HISTORY — DX: Overactive bladder: N32.81

## 2022-10-29 HISTORY — DX: Hemiplegia and hemiparesis following cerebral infarction affecting left non-dominant side: I69.354

## 2022-10-29 HISTORY — DX: Depression, unspecified: F32.A

## 2022-10-29 HISTORY — DX: Anemia, unspecified: D64.9

## 2022-10-29 HISTORY — DX: Bell's palsy: G51.0

## 2022-10-29 HISTORY — DX: Personal history of other specified conditions: Z87.898

## 2022-10-29 HISTORY — DX: Anxiety disorder, unspecified: F41.9

## 2022-10-29 HISTORY — DX: Tachycardia, unspecified: R00.0

## 2022-10-29 HISTORY — DX: Insomnia, unspecified: G47.00

## 2022-10-29 HISTORY — DX: Other chronic pain: G89.29

## 2022-10-29 HISTORY — DX: Dysphagia, unspecified: R13.10

## 2022-10-29 HISTORY — DX: Unspecified osteoarthritis, unspecified site: M19.90

## 2022-10-29 LAB — BASIC METABOLIC PANEL
Anion gap: 9 (ref 5–15)
BUN: 18 mg/dL (ref 6–20)
CO2: 25 mmol/L (ref 22–32)
Calcium: 9.1 mg/dL (ref 8.9–10.3)
Chloride: 105 mmol/L (ref 98–111)
Creatinine, Ser: 0.64 mg/dL (ref 0.61–1.24)
GFR, Estimated: >60 mL/min (ref >60)
Glucose, Bld: 110 mg/dL — ABNORMAL HIGH (ref 70–99)
Potassium: 4 mmol/L (ref 3.5–5.1)
Sodium: 139 mmol/L (ref 135–145)

## 2022-10-29 LAB — CBC
HCT: 46.3 % (ref 39.0–52.0)
Hemoglobin: 15.6 g/dL (ref 13.0–17.0)
MCH: 31.2 pg (ref 26.0–34.0)
MCHC: 33.7 g/dL (ref 30.0–36.0)
MCV: 92.6 fL (ref 80.0–100.0)
Platelets: 229 10*3/uL (ref 150–400)
RBC: 5 MIL/uL (ref 4.22–5.81)
RDW: 13.2 % (ref 11.5–15.5)
WBC: 9.1 10*3/uL (ref 4.0–10.5)
nRBC: 0 % (ref 0.0–0.2)

## 2022-10-29 LAB — SURGICAL PCR SCREEN
MRSA, PCR: NEGATIVE
Staphylococcus aureus: NEGATIVE

## 2022-10-29 SURGERY — ARTHROPLASTY, SHOULDER, TOTAL, REVERSE
Anesthesia: General | Site: Shoulder | Laterality: Right

## 2022-10-29 MED ORDER — MENTHOL 3 MG MT LOZG: 1.0000 | LOZENGE | OROMUCOSAL | Status: DC | PRN

## 2022-10-29 MED ORDER — DEXAMETHASONE SODIUM PHOSPHATE 10 MG/ML IJ SOLN
INTRAMUSCULAR | Status: AC
  Filled 2022-10-29: qty 1

## 2022-10-29 MED ORDER — EPHEDRINE 5 MG/ML INJ
INTRAVENOUS | Status: AC
  Filled 2022-10-29: qty 5

## 2022-10-29 MED ORDER — METHOCARBAMOL 500 MG IVPB - SIMPLE MED
INTRAVENOUS | Status: AC
  Filled 2022-10-29: qty 55

## 2022-10-29 MED ORDER — PHENYLEPHRINE 80 MCG/ML (10ML) SYRINGE FOR IV PUSH (FOR BLOOD PRESSURE SUPPORT)
PREFILLED_SYRINGE | INTRAVENOUS | Status: DC | PRN
  Administered 2022-10-29: 80 ug via INTRAVENOUS
  Administered 2022-10-29: 160 ug via INTRAVENOUS
  Administered 2022-10-29: 80 ug via INTRAVENOUS
  Administered 2022-10-29: 160 ug via INTRAVENOUS
  Administered 2022-10-29: 80 ug via INTRAVENOUS

## 2022-10-29 MED ORDER — PROPOFOL 10 MG/ML IV BOLUS
INTRAVENOUS | Status: AC
  Filled 2022-10-29: qty 20

## 2022-10-29 MED ORDER — ONDANSETRON HCL 4 MG/2ML IJ SOLN: 4.0000 mg | Freq: Four times a day (QID) | INTRAMUSCULAR | Status: DC | PRN

## 2022-10-29 MED ORDER — KETOROLAC TROMETHAMINE 30 MG/ML IJ SOLN
INTRAMUSCULAR | Status: AC
  Filled 2022-10-29: qty 1

## 2022-10-29 MED ORDER — OXYCODONE HCL 5 MG/5ML PO SOLN: 5.0000 mg | Freq: Once | ORAL | Status: AC | PRN

## 2022-10-29 MED ORDER — METOCLOPRAMIDE HCL 5 MG/ML IJ SOLN: 5.0000 mg | Freq: Three times a day (TID) | INTRAMUSCULAR | Status: DC | PRN

## 2022-10-29 MED ORDER — POLYVINYL ALCOHOL 1.4 % OP SOLN
1.0000 [drp] | Freq: Three times a day (TID) | OPHTHALMIC | Status: DC
  Administered 2022-10-29 – 2022-10-30 (×2): 1 [drp] via OPHTHALMIC
  Filled 2022-10-29: qty 15

## 2022-10-29 MED ORDER — PHENYLEPHRINE HCL (PRESSORS) 10 MG/ML IV SOLN
INTRAVENOUS | Status: AC
  Filled 2022-10-29: qty 1

## 2022-10-29 MED ORDER — QUETIAPINE FUMARATE 50 MG PO TABS
200.0000 mg | ORAL_TABLET | Freq: Every day | ORAL | Status: DC
  Administered 2022-10-29: 200 mg via ORAL
  Filled 2022-10-29: qty 4

## 2022-10-29 MED ORDER — LIDOCAINE 2% (20 MG/ML) 5 ML SYRINGE
INTRAMUSCULAR | Status: DC | PRN
  Administered 2022-10-29: 100 mg via INTRAVENOUS

## 2022-10-29 MED ORDER — VANCOMYCIN HCL IN DEXTROSE 1-5 GM/200ML-% IV SOLN
1000.0000 mg | INTRAVENOUS | Status: AC
  Administered 2022-10-29: 1000 mg via INTRAVENOUS
  Filled 2022-10-29: qty 200

## 2022-10-29 MED ORDER — OXYCODONE HCL 5 MG PO CAPS: 5.0000 mg | ORAL_CAPSULE | Freq: Four times a day (QID) | ORAL | 0 refills | Status: DC | PRN

## 2022-10-29 MED ORDER — CEFAZOLIN SODIUM-DEXTROSE 2-4 GM/100ML-% IV SOLN
2.0000 g | Freq: Four times a day (QID) | INTRAVENOUS | Status: AC
  Administered 2022-10-29 – 2022-10-30 (×3): 2 g via INTRAVENOUS
  Filled 2022-10-29 (×3): qty 100

## 2022-10-29 MED ORDER — DOCUSATE SODIUM 100 MG PO CAPS
100.0000 mg | ORAL_CAPSULE | Freq: Two times a day (BID) | ORAL | Status: DC
  Administered 2022-10-30: 100 mg via ORAL
  Filled 2022-10-29 (×2): qty 1

## 2022-10-29 MED ORDER — GABAPENTIN 300 MG PO CAPS
300.0000 mg | ORAL_CAPSULE | Freq: Every day | ORAL | Status: DC
  Administered 2022-10-30: 300 mg via ORAL
  Filled 2022-10-29: qty 1

## 2022-10-29 MED ORDER — FENTANYL CITRATE PF 50 MCG/ML IJ SOSY
50.0000 ug | PREFILLED_SYRINGE | INTRAMUSCULAR | Status: DC
  Filled 2022-10-29: qty 2

## 2022-10-29 MED ORDER — HYDROMORPHONE HCL 1 MG/ML IJ SOLN
INTRAMUSCULAR | Status: DC | PRN
  Administered 2022-10-29: .4 mg via INTRAVENOUS

## 2022-10-29 MED ORDER — DEXAMETHASONE SODIUM PHOSPHATE 10 MG/ML IJ SOLN
INTRAMUSCULAR | Status: DC | PRN
  Administered 2022-10-29: 8 mg via INTRAVENOUS

## 2022-10-29 MED ORDER — BACITRACIN 500 UNIT/GM EX OINT
1.0000 | TOPICAL_OINTMENT | Freq: Every day | CUTANEOUS | Status: DC
  Administered 2022-10-29 – 2022-10-30 (×2): 1 via TOPICAL
  Filled 2022-10-29: qty 14

## 2022-10-29 MED ORDER — SODIUM CHLORIDE 0.9 % IR SOLN
Status: DC | PRN
  Administered 2022-10-29: 1000 mL

## 2022-10-29 MED ORDER — TRANEXAMIC ACID-NACL 1000-0.7 MG/100ML-% IV SOLN
1000.0000 mg | INTRAVENOUS | Status: AC
  Administered 2022-10-29: 1000 mg via INTRAVENOUS
  Filled 2022-10-29: qty 100

## 2022-10-29 MED ORDER — BUPIVACAINE-EPINEPHRINE (PF) 0.25% -1:200000 IJ SOLN
INTRAMUSCULAR | Status: DC | PRN
  Administered 2022-10-29: 30 mL

## 2022-10-29 MED ORDER — HYDROMORPHONE HCL 1 MG/ML IJ SOLN
0.5000 mg | INTRAMUSCULAR | Status: DC | PRN
  Administered 2022-10-29 – 2022-10-30 (×2): 0.5 mg via INTRAVENOUS
  Filled 2022-10-29 (×2): qty 1

## 2022-10-29 MED ORDER — ONDANSETRON HCL 4 MG PO TABS: 4.0000 mg | ORAL_TABLET | Freq: Four times a day (QID) | ORAL | Status: DC | PRN

## 2022-10-29 MED ORDER — OXYCODONE HCL 5 MG PO TABS
5.0000 mg | ORAL_TABLET | Freq: Once | ORAL | Status: AC | PRN
  Administered 2022-10-29: 5 mg via ORAL

## 2022-10-29 MED ORDER — ASPIRIN 81 MG PO CHEW
81.0000 mg | CHEWABLE_TABLET | Freq: Every day | ORAL | Status: DC
  Administered 2022-10-29 – 2022-10-30 (×2): 81 mg via ORAL
  Filled 2022-10-29 (×2): qty 1

## 2022-10-29 MED ORDER — ACETAMINOPHEN 10 MG/ML IV SOLN
INTRAVENOUS | Status: AC
  Filled 2022-10-29: qty 100

## 2022-10-29 MED ORDER — KETAMINE HCL 10 MG/ML IJ SOLN
INTRAMUSCULAR | Status: DC | PRN
  Administered 2022-10-29: 10 mg via INTRAVENOUS
  Administered 2022-10-29: 20 mg via INTRAVENOUS

## 2022-10-29 MED ORDER — LACTATED RINGERS IV SOLN: INTRAVENOUS | Status: DC

## 2022-10-29 MED ORDER — TAMSULOSIN HCL 0.4 MG PO CAPS
0.4000 mg | ORAL_CAPSULE | Freq: Every day | ORAL | Status: DC
  Administered 2022-10-30: 0.4 mg via ORAL
  Filled 2022-10-29: qty 1

## 2022-10-29 MED ORDER — ORAL CARE MOUTH RINSE: 15.0000 mL | Freq: Once | OROMUCOSAL | Status: AC

## 2022-10-29 MED ORDER — KETAMINE HCL 50 MG/5ML IJ SOSY
PREFILLED_SYRINGE | INTRAMUSCULAR | Status: AC
  Filled 2022-10-29: qty 5

## 2022-10-29 MED ORDER — AMISULPRIDE (ANTIEMETIC) 5 MG/2ML IV SOLN
10.0000 mg | Freq: Once | INTRAVENOUS | Status: AC | PRN
  Administered 2022-10-29: 10 mg via INTRAVENOUS

## 2022-10-29 MED ORDER — MIRABEGRON ER 25 MG PO TB24
25.0000 mg | ORAL_TABLET | Freq: Every day | ORAL | Status: DC
  Administered 2022-10-30: 25 mg via ORAL
  Filled 2022-10-29: qty 1

## 2022-10-29 MED ORDER — BACLOFEN 10 MG PO TABS
15.0000 mg | ORAL_TABLET | Freq: Three times a day (TID) | ORAL | Status: DC
  Administered 2022-10-29: 15 mg via ORAL
  Filled 2022-10-29: qty 1.5
  Filled 2022-10-29: qty 2
  Filled 2022-10-29: qty 1.5

## 2022-10-29 MED ORDER — SENNOSIDES-DOCUSATE SODIUM 8.6-50 MG PO TABS
2.0000 | ORAL_TABLET | Freq: Every day | ORAL | Status: DC
  Filled 2022-10-29: qty 2

## 2022-10-29 MED ORDER — KETOROLAC TROMETHAMINE 30 MG/ML IJ SOLN
30.0000 mg | Freq: Once | INTRAMUSCULAR | Status: AC
  Administered 2022-10-29: 30 mg via INTRAVENOUS

## 2022-10-29 MED ORDER — SUCCINYLCHOLINE CHLORIDE 200 MG/10ML IV SOSY
PREFILLED_SYRINGE | INTRAVENOUS | Status: DC | PRN
  Administered 2022-10-29: 120 mg via INTRAVENOUS

## 2022-10-29 MED ORDER — PROPOFOL 10 MG/ML IV BOLUS
INTRAVENOUS | Status: DC | PRN
  Administered 2022-10-29: 150 mg via INTRAVENOUS

## 2022-10-29 MED ORDER — NORTRIPTYLINE HCL 25 MG PO CAPS
50.0000 mg | ORAL_CAPSULE | Freq: Every day | ORAL | Status: DC
  Administered 2022-10-29: 50 mg via ORAL
  Filled 2022-10-29 (×2): qty 2

## 2022-10-29 MED ORDER — ALBUMIN HUMAN 5 % IV SOLN
12.5000 g | Freq: Once | INTRAVENOUS | Status: AC
  Administered 2022-10-29: 12.5 g via INTRAVENOUS

## 2022-10-29 MED ORDER — AZELASTINE HCL 0.1 % NA SOLN
2.0000 | Freq: Two times a day (BID) | NASAL | Status: DC
  Administered 2022-10-29 – 2022-10-30 (×2): 2 via NASAL
  Filled 2022-10-29: qty 30

## 2022-10-29 MED ORDER — METOCLOPRAMIDE HCL 5 MG PO TABS: 5.0000 mg | ORAL_TABLET | Freq: Three times a day (TID) | ORAL | Status: DC | PRN

## 2022-10-29 MED ORDER — CHLORHEXIDINE GLUCONATE 0.12 % MT SOLN
15.0000 mL | Freq: Once | OROMUCOSAL | Status: AC
  Administered 2022-10-29: 15 mL via OROMUCOSAL

## 2022-10-29 MED ORDER — FENTANYL CITRATE (PF) 100 MCG/2ML IJ SOLN
INTRAMUSCULAR | Status: AC
  Filled 2022-10-29: qty 2

## 2022-10-29 MED ORDER — PHENYLEPHRINE 80 MCG/ML (10ML) SYRINGE FOR IV PUSH (FOR BLOOD PRESSURE SUPPORT)
PREFILLED_SYRINGE | INTRAVENOUS | Status: AC
  Filled 2022-10-29: qty 10

## 2022-10-29 MED ORDER — PHENOL 1.4 % MT LIQD: 1.0000 | OROMUCOSAL | Status: DC | PRN

## 2022-10-29 MED ORDER — PROMETHAZINE HCL 25 MG/ML IJ SOLN: 6.2500 mg | INTRAMUSCULAR | Status: DC | PRN

## 2022-10-29 MED ORDER — PROPRANOLOL HCL 20 MG PO TABS
10.0000 mg | ORAL_TABLET | Freq: Two times a day (BID) | ORAL | Status: DC
  Administered 2022-10-29 – 2022-10-30 (×2): 10 mg via ORAL
  Filled 2022-10-29 (×2): qty 1

## 2022-10-29 MED ORDER — POLYETHYL GLYCOL-PROPYL GLYCOL 0.4-0.3 % OP SOLN: 1.0000 [drp] | Freq: Every day | OPHTHALMIC | Status: DC

## 2022-10-29 MED ORDER — SUCCINYLCHOLINE CHLORIDE 200 MG/10ML IV SOSY
PREFILLED_SYRINGE | INTRAVENOUS | Status: AC
  Filled 2022-10-29: qty 10

## 2022-10-29 MED ORDER — METHOCARBAMOL 500 MG PO TABS
500.0000 mg | ORAL_TABLET | Freq: Four times a day (QID) | ORAL | Status: DC | PRN
  Administered 2022-10-30: 500 mg via ORAL
  Filled 2022-10-29: qty 1

## 2022-10-29 MED ORDER — HYDROMORPHONE HCL 1 MG/ML IJ SOLN
INTRAMUSCULAR | Status: AC
  Filled 2022-10-29: qty 1

## 2022-10-29 MED ORDER — BACLOFEN 10 MG PO TABS
15.0000 mg | ORAL_TABLET | Freq: Three times a day (TID) | ORAL | Status: DC
  Administered 2022-10-30 (×2): 15 mg via ORAL
  Filled 2022-10-29 (×2): qty 2

## 2022-10-29 MED ORDER — PHENYLEPHRINE HCL-NACL 20-0.9 MG/250ML-% IV SOLN
INTRAVENOUS | Status: DC | PRN
  Administered 2022-10-29: 30 ug/min via INTRAVENOUS

## 2022-10-29 MED ORDER — SERTRALINE HCL 25 MG PO TABS
25.0000 mg | ORAL_TABLET | Freq: Every day | ORAL | Status: DC
  Administered 2022-10-30: 25 mg via ORAL
  Filled 2022-10-29: qty 1

## 2022-10-29 MED ORDER — ONDANSETRON HCL 4 MG/2ML IJ SOLN
INTRAMUSCULAR | Status: DC | PRN
  Administered 2022-10-29: 4 mg via INTRAVENOUS

## 2022-10-29 MED ORDER — MIDAZOLAM HCL 2 MG/2ML IJ SOLN
1.0000 mg | INTRAMUSCULAR | Status: DC
  Filled 2022-10-29: qty 2

## 2022-10-29 MED ORDER — METHOCARBAMOL 500 MG IVPB - SIMPLE MED
500.0000 mg | Freq: Four times a day (QID) | INTRAVENOUS | Status: DC | PRN
  Administered 2022-10-29: 500 mg via INTRAVENOUS

## 2022-10-29 MED ORDER — POLYETHYLENE GLYCOL 3350 17 G PO PACK: 17.0000 g | PACK | Freq: Every day | ORAL | Status: DC | PRN

## 2022-10-29 MED ORDER — HYDROMORPHONE HCL 2 MG/ML IJ SOLN
INTRAMUSCULAR | Status: AC
  Filled 2022-10-29: qty 1

## 2022-10-29 MED ORDER — LIDOCAINE HCL (PF) 2 % IJ SOLN
INTRAMUSCULAR | Status: AC
  Filled 2022-10-29: qty 5

## 2022-10-29 MED ORDER — ATORVASTATIN CALCIUM 40 MG PO TABS
40.0000 mg | ORAL_TABLET | Freq: Every day | ORAL | Status: DC
  Administered 2022-10-30: 40 mg via ORAL
  Filled 2022-10-29: qty 1

## 2022-10-29 MED ORDER — FLUTICASONE PROPIONATE 50 MCG/ACT NA SUSP
1.0000 | Freq: Every day | NASAL | Status: DC
  Administered 2022-10-29 – 2022-10-30 (×2): 1 via NASAL
  Filled 2022-10-29: qty 16

## 2022-10-29 MED ORDER — MIDAZOLAM HCL 2 MG/2ML IJ SOLN
INTRAMUSCULAR | Status: AC
  Filled 2022-10-29: qty 2

## 2022-10-29 MED ORDER — LIDOCAINE 4 % EX PTCH: 1.0000 | MEDICATED_PATCH | Freq: Every day | CUTANEOUS | Status: DC

## 2022-10-29 MED ORDER — CEFAZOLIN SODIUM-DEXTROSE 2-4 GM/100ML-% IV SOLN
2.0000 g | INTRAVENOUS | Status: AC
  Administered 2022-10-29: 2 g via INTRAVENOUS
  Filled 2022-10-29: qty 100

## 2022-10-29 MED ORDER — SODIUM CHLORIDE 0.9 % IV SOLN: INTRAVENOUS | Status: DC

## 2022-10-29 MED ORDER — GABAPENTIN 300 MG PO CAPS
600.0000 mg | ORAL_CAPSULE | Freq: Every day | ORAL | Status: DC
  Administered 2022-10-29: 600 mg via ORAL
  Filled 2022-10-29: qty 2

## 2022-10-29 MED ORDER — AMISULPRIDE (ANTIEMETIC) 5 MG/2ML IV SOLN
INTRAVENOUS | Status: AC
  Filled 2022-10-29: qty 4

## 2022-10-29 MED ORDER — ACETAMINOPHEN 10 MG/ML IV SOLN
INTRAVENOUS | Status: DC | PRN
  Administered 2022-10-29: 1000 mg via INTRAVENOUS

## 2022-10-29 MED ORDER — OXYCODONE HCL 5 MG PO TABS
5.0000 mg | ORAL_TABLET | ORAL | Status: DC | PRN
  Administered 2022-10-29 – 2022-10-30 (×3): 10 mg via ORAL
  Administered 2022-10-30: 5 mg via ORAL
  Filled 2022-10-29: qty 2
  Filled 2022-10-29: qty 1
  Filled 2022-10-29 (×2): qty 2

## 2022-10-29 MED ORDER — TRANEXAMIC ACID-NACL 1000-0.7 MG/100ML-% IV SOLN
1000.0000 mg | Freq: Once | INTRAVENOUS | Status: AC
  Administered 2022-10-29: 1000 mg via INTRAVENOUS
  Filled 2022-10-29: qty 100

## 2022-10-29 MED ORDER — OXYCODONE HCL 5 MG PO TABS
ORAL_TABLET | ORAL | Status: AC
  Filled 2022-10-29: qty 1

## 2022-10-29 MED ORDER — BUPIVACAINE-EPINEPHRINE (PF) 0.25% -1:200000 IJ SOLN
INTRAMUSCULAR | Status: AC
  Filled 2022-10-29: qty 30

## 2022-10-29 MED ORDER — HYDROMORPHONE HCL 1 MG/ML IJ SOLN
0.2500 mg | INTRAMUSCULAR | Status: DC | PRN
  Administered 2022-10-29 (×4): 0.5 mg via INTRAVENOUS

## 2022-10-29 MED ORDER — LIDOCAINE 5 % EX PTCH
1.0000 | MEDICATED_PATCH | CUTANEOUS | Status: DC
  Administered 2022-10-30: 1 via TRANSDERMAL
  Filled 2022-10-29: qty 1

## 2022-10-29 MED ORDER — MIDAZOLAM HCL 2 MG/2ML IJ SOLN
INTRAMUSCULAR | Status: DC | PRN
  Administered 2022-10-29: 2 mg via INTRAVENOUS

## 2022-10-29 MED ORDER — ONDANSETRON HCL 4 MG/2ML IJ SOLN
INTRAMUSCULAR | Status: AC
  Filled 2022-10-29: qty 2

## 2022-10-29 MED ORDER — MEPERIDINE HCL 50 MG/ML IJ SOLN: 6.2500 mg | INTRAMUSCULAR | Status: DC | PRN

## 2022-10-29 MED ORDER — BUSPIRONE HCL 10 MG PO TABS
10.0000 mg | ORAL_TABLET | Freq: Three times a day (TID) | ORAL | Status: DC
  Administered 2022-10-29 – 2022-10-30 (×3): 10 mg via ORAL
  Filled 2022-10-29 (×3): qty 1

## 2022-10-29 MED ORDER — OMEGA-3-ACID ETHYL ESTERS 1 G PO CAPS
1.0000 g | ORAL_CAPSULE | Freq: Every day | ORAL | Status: DC
  Administered 2022-10-29 – 2022-10-30 (×2): 1 g via ORAL
  Filled 2022-10-29 (×2): qty 1

## 2022-10-29 MED ORDER — FENTANYL CITRATE (PF) 100 MCG/2ML IJ SOLN
INTRAMUSCULAR | Status: DC | PRN
  Administered 2022-10-29: 100 ug via INTRAVENOUS

## 2022-10-29 MED ORDER — ACETAMINOPHEN 325 MG PO TABS: 325.0000 mg | ORAL_TABLET | Freq: Four times a day (QID) | ORAL | Status: DC | PRN

## 2022-10-29 MED ORDER — PANTOPRAZOLE SODIUM 40 MG PO TBEC
40.0000 mg | DELAYED_RELEASE_TABLET | Freq: Every day | ORAL | Status: DC
  Administered 2022-10-30: 40 mg via ORAL
  Filled 2022-10-29: qty 1

## 2022-10-29 MED ORDER — CELECOXIB 200 MG PO CAPS
200.0000 mg | ORAL_CAPSULE | Freq: Every day | ORAL | Status: DC
  Administered 2022-10-30: 200 mg via ORAL
  Filled 2022-10-29: qty 1

## 2022-10-29 MED ORDER — MELATONIN 5 MG PO TABS
5.0000 mg | ORAL_TABLET | Freq: Every day | ORAL | Status: DC
  Administered 2022-10-29: 5 mg via ORAL
  Filled 2022-10-29: qty 1

## 2022-10-29 MED ORDER — ALBUMIN HUMAN 5 % IV SOLN
INTRAVENOUS | Status: AC
  Filled 2022-10-29: qty 250

## 2022-10-29 MED ORDER — ROCURONIUM BROMIDE 10 MG/ML (PF) SYRINGE
PREFILLED_SYRINGE | INTRAVENOUS | Status: AC
  Filled 2022-10-29: qty 10

## 2022-10-29 MED ORDER — EPHEDRINE SULFATE-NACL 50-0.9 MG/10ML-% IV SOSY
PREFILLED_SYRINGE | INTRAVENOUS | Status: DC | PRN
  Administered 2022-10-29 (×2): 10 mg via INTRAVENOUS

## 2022-10-29 SURGICAL SUPPLY — 76 items
AID PSTN UNV HD RSTRNT DISP (MISCELLANEOUS) ×1
BAG COUNTER SPONGE SURGICOUNT (BAG) IMPLANT
BAG SPEC THK2 15X12 ZIP CLS (MISCELLANEOUS) ×1
BAG SPNG CNTER NS LX DISP (BAG) ×1
BAG ZIPLOCK 12X15 (MISCELLANEOUS) IMPLANT
BASEPLATE GLENOSPHERE 25 (Plate) IMPLANT
BEARING HUMERAL 40 STD VITE (Joint) IMPLANT
BIT DRILL 1.6MX128 (BIT) IMPLANT
BIT DRILL F/CENTRAL SCRW 3.2 (BIT) ×1 IMPLANT
BIT DRILL F/CENTRAL SCRW 3.2MM (BIT) IMPLANT
BIT DRILL QUICK REL 1/8 2PK SL (DRILL) IMPLANT
BIT DRILL TWIST 2.7 (BIT) IMPLANT
BLADE SAG 18X100X1.27 (BLADE) ×1 IMPLANT
BRNG HUM STD 40 RVRS SHLDR (Joint) ×1 IMPLANT
CEMENT HV SMART SET (Cement) IMPLANT
COVER BACK TABLE 60X90IN (DRAPES) ×1 IMPLANT
COVER SURGICAL LIGHT HANDLE (MISCELLANEOUS) ×1 IMPLANT
DIAL VERSA SHOULDER 40 STD (Joint) IMPLANT
DRAPE INCISE IOBAN 66X45 STRL (DRAPES) ×1 IMPLANT
DRAPE ORTHO SPLIT 77X108 STRL (DRAPES) ×2
DRAPE SHEET LG 3/4 BI-LAMINATE (DRAPES) ×1 IMPLANT
DRAPE SURG ORHT 6 SPLT 77X108 (DRAPES) ×2 IMPLANT
DRAPE TOP 10253 STERILE (DRAPES) ×1 IMPLANT
DRAPE U-SHAPE 47X51 STRL (DRAPES) ×1 IMPLANT
DRILL BIT F/CENTRAL SCRW 3.2MM (BIT) ×1
DRILL QUICK RELEASE 1/8 INCH (DRILL) ×1
DRSG ADAPTIC 3X8 NADH LF (GAUZE/BANDAGES/DRESSINGS) ×1 IMPLANT
DURAPREP 26ML APPLICATOR (WOUND CARE) ×1 IMPLANT
ELECT BLADE TIP CTD 4 INCH (ELECTRODE) ×1 IMPLANT
ELECT NDL TIP 2.8 STRL (NEEDLE) ×1 IMPLANT
ELECT NEEDLE TIP 2.8 STRL (NEEDLE) ×1 IMPLANT
ELECT REM PT RETURN 15FT ADLT (MISCELLANEOUS) ×1 IMPLANT
FACESHIELD WRAPAROUND (MASK) ×1 IMPLANT
FACESHIELD WRAPAROUND OR TEAM (MASK) ×1 IMPLANT
GAUZE PAD ABD 8X10 STRL (GAUZE/BANDAGES/DRESSINGS) ×1 IMPLANT
GAUZE SPONGE 4X4 12PLY STRL (GAUZE/BANDAGES/DRESSINGS) ×1 IMPLANT
GLOVE BIOGEL PI IND STRL 7.5 (GLOVE) ×1 IMPLANT
GLOVE BIOGEL PI IND STRL 8.5 (GLOVE) ×1 IMPLANT
GLOVE ORTHO TXT STRL SZ7.5 (GLOVE) ×1 IMPLANT
GLOVE SURG ORTHO 8.5 STRL (GLOVE) ×1 IMPLANT
GOWN STRL REUS W/ TWL XL LVL3 (GOWN DISPOSABLE) ×2 IMPLANT
GOWN STRL REUS W/TWL XL LVL3 (GOWN DISPOSABLE) ×2
KIT BASIN OR (CUSTOM PROCEDURE TRAY) ×1 IMPLANT
KIT TURNOVER KIT A (KITS) IMPLANT
MANIFOLD NEPTUNE II (INSTRUMENTS) ×1 IMPLANT
NDL MAYO CATGUT SZ4 TPR NDL (NEEDLE) ×1 IMPLANT
NEEDLE MAYO CATGUT SZ4 (NEEDLE) IMPLANT
NS IRRIG 1000ML POUR BTL (IV SOLUTION) ×1 IMPLANT
PACK SHOULDER (CUSTOM PROCEDURE TRAY) ×1 IMPLANT
PIN HUMERAL STMN 3.2MMX9IN (INSTRUMENTS) IMPLANT
PIN THREADED REVERSE (PIN) IMPLANT
PROTECTOR NERVE ULNAR (MISCELLANEOUS) ×1 IMPLANT
RESTRAINT HEAD UNIVERSAL NS (MISCELLANEOUS) ×1 IMPLANT
SCREW BONE CORT 6.5X35MM (Screw) IMPLANT
SCREW BONE LOCKING 4.75X30X3.5 (Screw) IMPLANT
SCREW LOCKING NS 4.75MMX20MM (Screw) IMPLANT
SLING ARM FOAM STRAP LRG (SOFTGOODS) IMPLANT
SMARTMIX MINI TOWER (MISCELLANEOUS) IMPLANT
SPIKE FLUID TRANSFER (MISCELLANEOUS) ×1 IMPLANT
SPONGE T-LAP 4X18 ~~LOC~~+RFID (SPONGE) ×1 IMPLANT
STEM HUM IDENTITY STD 12 (Stem) IMPLANT
STRIP CLOSURE SKIN 1/2X4 (GAUZE/BANDAGES/DRESSINGS) ×1 IMPLANT
SUCTION FRAZIER HANDLE 10FR (MISCELLANEOUS)
SUCTION TUBE FRAZIER 10FR DISP (MISCELLANEOUS) ×1 IMPLANT
SUT FIBERWIRE #2 38 T-5 BLUE (SUTURE) ×2 IMPLANT
SUT MNCRL AB 4-0 PS2 18 (SUTURE) ×1 IMPLANT
SUT VIC AB 0 CT1 36 (SUTURE) ×2 IMPLANT
SUT VIC AB 0 CT2 27 (SUTURE) ×1 IMPLANT
SUT VIC AB 2-0 CT1 27 (SUTURE) ×1
SUT VIC AB 2-0 CT1 TAPERPNT 27 (SUTURE) ×1 IMPLANT
SUTURE FIBERWR #2 38 T-5 BLUE (SUTURE) ×2 IMPLANT
TAPE CLOTH SURG 6X10 WHT LF (GAUZE/BANDAGES/DRESSINGS) IMPLANT
TOWEL OR 17X26 10 PK STRL BLUE (TOWEL DISPOSABLE) ×1 IMPLANT
TOWER SMARTMIX MINI (MISCELLANEOUS) IMPLANT
TRAY HUMERAL NEUTRAL EXT 6 (Shoulder) IMPLANT
YANKAUER SUCT BULB TIP NO VENT (SUCTIONS) IMPLANT

## 2022-10-29 NOTE — Op Note (Unsigned)
NAME: Roger Morton, Roger Morton MEDICAL RECORD NO: [id number] ACCOUNT NO: [id number] DATE OF BIRTH: 1970/10/13 FACILITY: [facility] LOCATION[facility] PHYSICIAN: Doran Heater. Veverly Fells, MD  Operative Report   DATE OF PROCEDURE: 10/29/2022  PREOPERATIVE DIAGNOSIS:  Right shoulder avascular necrosis and increasing pain.  POSTOPERATIVE DIAGNOSIS:  Right shoulder avascular necrosis and increasing pain.  PROCEDURE PERFORMED:  Right shoulder reverse total shoulder arthroplasty using Biomet Identity stem with comprehensive baseplate and glenosphere.  ATTENDING SURGEON:  Doran Heater. Veverly Fells, MD  ASSISTANT:  Charletta Cousin Dixon, PA-C, who was scrubbed for the entire procedure and necessary for satisfactory completion of surgery.  ANESTHESIA:  General anesthesia was used plus interscalene block.  ESTIMATED BLOOD LOSS:  200 mL  FLUID REPLACEMENT:  1500 mL crystalloid.  COUNTS:  Instrument counts correct.  COMPLICATIONS:  No complications.  ANTIBIOTICS:  Perioperative antibiotics were given.  INDICATIONS:  The patient is a 52 year old male who presents with a history of worsening shoulder pain and also declining function related to shoulder AVN.  The patient has had worsening pain and significant decline in function recently.  The patient  also relies on his arms for mobilization.  He has also had a recent stroke and he has severe hip pain. Due to progressive wear in his shoulder and declining independent based on his shoulder, we discussed options and agreed mutually to proceed with  reverse shoulder replacement, which would allow early active motion, balance and mobility.  Informed consent obtained.  DESCRIPTION OF PROCEDURE:  After an adequate level of anesthesia was achieved, the patient was positioned in modified beach chair position.  Right shoulder correctly identified and sterilely prepped and draped performed.  Timeout called, verifying  correct patient, correct site.  We utilized a deltopectoral incision  starting at the coracoid process extending down to the anterior humerus.  Dissection down through subcutaneous tissues using Bovie.  Cephalic vein was identified and taken laterally,  pectoralis taken medially.  Conjoined tendon identified and retracted medially.  Biceps tenodesed in situ with 0 Vicryl suture figure-of-eight x2 incorporating part of the pectoralis tendon.  We then released the subscapularis subperiosteally off the  lesser tuberosity and tagged for protection of the axillary nerve. We released the inferior capsule, extending the shoulder and externally rotating and delivering it out of the wound. We noted there to be a full thickness cartilage loss superiorly and a  large avascular segment.  We entered the proximal humerus with a reamer, reamed up to a size 12.  Next, we went ahead and used the intramedullary guide and resected the head at 25-30 degrees of retroversion with the oscillating saw using the cutting jig.   We then removed excess osteophytes off the medial humerus. We subluxed the humerus posteriorly, gaining good exposure of the glenoid face.  We removed the capsule, labrum, biceps anchor, placed our deep retractors, removed the remaining cartilage on  the glenoid side.  We then placed our guide pin centered low on the glenoid face.  We reamed for the baseplate.  We then selected a comprehensive mini baseplate and impacted that in position. We placed a central screw of 35 mm in length with excellent  purchase.  We then did an superior and inferior screws of 30 and 20.  They were locked and that we had great baseplate stability.  At this point, we went ahead and used the glenosphere, which was a 40 mm standard offset and impacted that into place.  We  got it on the B setting in, so it was  slightly offset inferiorly.  Once that was secured and I did a finger sweep to make sure that was clear of soft tissue inferiorly, we went back to the humeral side.  We then broached up to a size  12 broach for  Identity stem and then once we had our humeral preparation done, we trialled off with that and we used the -6 mm extended neutral humeral tray and then we used the standard bearing for the 40 mm. So once we had that trial complete, we were happy with  that.  We irrigated.  We then used available bone graft and impaction grafting technique using the [place] Identity 12 standard humeral stem with the -6 extended neutral humeral tray.  We impacted that in the proper position.  We then selected the real  highly cross-linked poly.  This was a Vivacit 40 mm standard bearing.  We impacted that in place.  We then reduced the shoulder, had nice little pop as it reduced.  Appropriate conjoined tension, no gapping with inferior pull or external rotation.  We  then resected the subscapularis.  We irrigated thoroughly and closed in layers.  We used a deltopectoral closure with #1 Vicryl suture, 0 Vicryl followed by 2-0 subcutaneous closure and 4-0 running Monocryl for skin.  Steri-Strips applied followed by  sterile dressing.  The patient tolerated surgery well.   VAI D: 10/29/2022 2:58:02 pm T: 10/29/2022 7:17:00 pm  JOB: [id number]

## 2022-10-29 NOTE — Discharge Instructions (Signed)
Ice to the shoulder constantly.  Keep the incision covered and clean and dry for one week, then ok to get it wet in the shower. Leave incision uncovered after one week and open to air.   Do exercise as instructed several times per day.  DO NOT reach behind your back or push up out of a chair with the operative arm.  Use a sling while you are up and around for comfort, may remove while seated.  Keep pillow propped behind the operative elbow.  Follow up with Dr Veverly Fells in two weeks in the office, call [phone] for appt

## 2022-10-29 NOTE — Plan of Care (Signed)
  Problem: Education: Goal: Knowledge of the prescribed therapeutic regimen will improve Outcome: Progressing   Problem: Pain Management: Goal: Pain level will decrease with appropriate interventions Outcome: Progressing   Problem: Elimination: Goal: Will not experience complications related to urinary retention Outcome: Progressing   Problem: Pain Managment: Goal: General experience of comfort will improve Outcome: Progressing   Problem: Safety: Goal: Ability to remain free from injury will improve Outcome: Progressing

## 2022-10-29 NOTE — Interval H&P Note (Signed)
History and Physical Interval Note:  10/29/2022 11:46 AM  Roger Morton  has presented today for surgery, with the diagnosis of right shoulder avascular necrosis.  The various methods of treatment have been discussed with the patient and family. After consideration of risks, benefits and other options for treatment, the patient has consented to  Procedure(s): REVERSE SHOULDER ARTHROPLASTY (Right) as a surgical intervention.  The patient's history has been reviewed, patient examined, no change in status, stable for surgery.  I have reviewed the patient's chart and labs.  Questions were answered to the patient's satisfaction.     Augustin Schooling

## 2022-10-29 NOTE — Anesthesia Procedure Notes (Signed)
Procedure Name: Intubation Date/Time: 10/29/2022 12:51 PM  Performed by: Niel Hummer, CRNAPre-anesthesia Checklist: Patient identified, Emergency Drugs available, Suction available and Patient being monitored Patient Re-evaluated:Patient Re-evaluated prior to induction Oxygen Delivery Method: Circle system utilized Preoxygenation: Pre-oxygenation with 100% oxygen Induction Type: IV induction Ventilation: Mask ventilation without difficulty Laryngoscope Size: Mac and 4 Grade View: Grade I Tube type: Oral Tube size: 7.5 mm Number of attempts: 1 Airway Equipment and Method: Stylet Placement Confirmation: ETT inserted through vocal cords under direct vision, positive ETCO2 and breath sounds checked- equal and bilateral Secured at: 24 cm Tube secured with: Tape Dental Injury: Teeth and Oropharynx as per pre-operative assessment

## 2022-10-29 NOTE — Transfer of Care (Signed)
Immediate Anesthesia Transfer of Care Note  Patient: Roger Morton  Procedure(s) Performed: REVERSE SHOULDER ARTHROPLASTY (Right: Shoulder)  Patient Location: PACU  Anesthesia Type:General  Level of Consciousness: drowsy  Airway & Oxygen Therapy: Patient Spontanous Breathing and Patient connected to face mask oxygen  Post-op Assessment: Report given to RN and Post -op Vital signs reviewed and stable  Post vital signs: Reviewed and stable  Last Vitals:  Vitals Value Taken Time  BP 86/61 10/29/22 1505  Temp    Pulse 87 10/29/22 1506  Resp 20 10/29/22 1506  SpO2 94 % 10/29/22 1506  Vitals shown include unvalidated device data.  Last Pain:  Vitals:   10/29/22 0955  TempSrc:   PainSc: 0-No pain         Complications: No notable events documented.

## 2022-10-29 NOTE — Anesthesia Preprocedure Evaluation (Signed)
Anesthesia Evaluation  Patient identified by MRN, date of birth, ID band Patient awake    Reviewed: Allergy & Precautions, NPO status , Patient's Chart, lab work & pertinent test results  Airway Mallampati: II  TM Distance: >3 FB Neck ROM: Full    Dental  (+) Dental Advisory Given, Missing, Poor Dentition   Pulmonary Current Smoker and Patient abstained from smoking.   Pulmonary exam normal breath sounds clear to auscultation       Cardiovascular hypertension, Pt. on medications Normal cardiovascular exam Rhythm:Regular Rate:Normal     Neuro/Psych Seizures -,   Anxiety Depression    brain surgery for tumor excision, stroke, gait disturbance requiring use of walker CVA, Residual Symptoms    GI/Hepatic Neg liver ROS,GERD  Medicated,,  Endo/Other  negative endocrine ROS    Renal/GU negative Renal ROS     Musculoskeletal Left femur fx   Abdominal   Peds  Hematology negative hematology ROS (+) Plt 263k   Anesthesia Other Findings Day of surgery medications reviewed with the patient.  Reproductive/Obstetrics                             Anesthesia Physical Anesthesia Plan  ASA: 4  Anesthesia Plan: General   Post-op Pain Management: Dilaudid IV   Induction: Intravenous  PONV Risk Score and Plan: 1 and Ondansetron and Treatment may vary due to age or medical condition  Airway Management Planned: Oral ETT  Additional Equipment:   Intra-op Plan:   Post-operative Plan: Extubation in OR  Informed Consent: I have reviewed the patients History and Physical, chart, labs and discussed the procedure including the risks, benefits and alternatives for the proposed anesthesia with the patient or authorized representative who has indicated his/her understanding and acceptance.     Dental advisory given  Plan Discussed with: CRNA  Anesthesia Plan Comments:         Anesthesia Quick  Evaluation

## 2022-10-29 NOTE — Brief Op Note (Signed)
10/29/2022  2:51 PM  PATIENT:  Roger Morton  52 y.o. male  PRE-OPERATIVE DIAGNOSIS:  right shoulder avascular necrosis  POST-OPERATIVE DIAGNOSIS:  right shoulder avascular necrosis  PROCEDURE:  Procedure(s): REVERSE SHOULDER ARTHROPLASTY (Right) Biomet Identity Stem with comprehensive baseplate and glenosphere  SURGEON:  Surgeon(s) and Role:    Netta Cedars, MD - Primary  PHYSICIAN ASSISTANT:   ASSISTANTS: Ventura Bruns, PA-C   ANESTHESIA:   regional and general  EBL:  200 mL   BLOOD ADMINISTERED:none  DRAINS: none   LOCAL MEDICATIONS USED:  MARCAINE     SPECIMEN:  No Specimen  DISPOSITION OF SPECIMEN:  N/A  COUNTS:  YES  TOURNIQUET:  * No tourniquets in log *  DICTATION: .Other Dictation: Dictation Number [id number]  PLAN OF CARE: Admit for overnight observation  PATIENT DISPOSITION:  PACU - hemodynamically stable.   Delay start of Pharmacological VTE agent (>24hrs) due to surgical blood loss or risk of bleeding: not applicable

## 2022-10-30 DIAGNOSIS — M87851 Other osteonecrosis, right femur: Secondary | ICD-10-CM | POA: Diagnosis not present

## 2022-10-30 LAB — BASIC METABOLIC PANEL
Anion gap: 9 (ref 5–15)
BUN: 14 mg/dL (ref 6–20)
CO2: 25 mmol/L (ref 22–32)
Calcium: 8.6 mg/dL — ABNORMAL LOW (ref 8.9–10.3)
Chloride: 104 mmol/L (ref 98–111)
Creatinine, Ser: 0.6 mg/dL — ABNORMAL LOW (ref 0.61–1.24)
GFR, Estimated: >60 mL/min (ref >60)
Glucose, Bld: 118 mg/dL — ABNORMAL HIGH (ref 70–99)
Potassium: 3.9 mmol/L (ref 3.5–5.1)
Sodium: 138 mmol/L (ref 135–145)

## 2022-10-30 LAB — HEMOGLOBIN AND HEMATOCRIT, BLOOD
HCT: 37.9 % — ABNORMAL LOW (ref 39.0–52.0)
Hemoglobin: 12.5 g/dL — ABNORMAL LOW (ref 13.0–17.0)

## 2022-10-30 NOTE — TOC Transition Note (Signed)
Transition of Care [facility]) - CM/SW Discharge Note  Patient Details  Name: Roger Morton MRN: [id number] Date of Birth: 25-Aug-1970  Transition of Care [facility]) CM/SW Contact:  Sherie Don, LCSW Phone Number: 10/30/2022, 3:07 PM  Clinical Narrative: FL2 done. Patient is medically stable to return to [facility] for LTC. CSW spoke with Pamala Hurry, RN at [facility], regarding the patient returning. Although this was a prearranged surgery and patient was transported by the facility with his wheelchair, they do not have wheelchair transportation on the weekend. CSW called [employer] and spoke with Georgina Snell, who informed CSW [facility] does not have wheelchair transportation on the weekends.  CSW also spoke with Crystal, another RN at [facility], regarding the patient returning today but she was unable to confirm discharge plan. Tonya in admissions called CSW regarding the patient returning today as well. CSW explained that as this is a prearranged surgery and the patient is a LTC resident of the facility, he is ready to be discharged today. FL2, discharge summary, discharge orders, and SNF transfer report faxed to facility in hub. RN confirmed patient would prefer to return to the facility via taxi with his wheelchair folded up in the trunk rather than go via [facility] and have the facility pick up the wheelchair later.  Patient signed rider waiver, which was placed on chart. CSW received [facility] supervisor approval for taxi voucher back to [facility]. Discharge packet and taxi voucher provided to RN. RN to call [facility] when patient is ready for transport. TOC signing off.  Final next level of care: Skilled Nursing Facility Barriers to Discharge: No Barriers Identified  Patient Goals and CMS Choice CMS Medicare.gov Compare Post Acute Care list provided to:: Patient Choice offered to / list presented to : Patient  Discharge Placement Existing PASRR number confirmed : 10/30/22          Patient  chooses bed at: [facility] Patient to be transferred to facility by: [facility]) Patient and family notified of of transfer: 10/30/22  Discharge Plan and Services Additional resources added to the After Visit Summary for        DME Arranged: N/A DME Agency: NA  Social Determinants of Health ([place]) Interventions SDOH Screenings   Food Insecurity: No Food Insecurity (10/29/2022)  Housing: Low Risk  (10/29/2022)  Transportation Needs: No Transportation Needs (10/29/2022)  Utilities: Not At Risk (10/29/2022)  Alcohol Screen: Low Risk  (05/25/2021)  Depression (PHQ2-9): Low Risk  (05/25/2021)  Financial Resource Strain: Low Risk  (05/25/2021)  Physical Activity: Sufficiently Active (05/25/2021)  Social Connections: Moderately Integrated (05/25/2021)  Stress: No Stress Concern Present (05/25/2021)  Tobacco Use: High Risk (10/29/2022)   Readmission Risk Interventions     No data to display

## 2022-10-30 NOTE — Discharge Summary (Signed)
Physician Discharge Summary  Patient ID: Roger Morton MRN: [id number] DOB/AGE: 12-17-1970 52 y.o.  Admit date: 10/29/2022 Discharge date: 10/30/2022   Procedures:  Procedure(s) (LRB): REVERSE SHOULDER ARTHROPLASTY (Right)  Attending Physician: Esmond Plants, MD  Admission Diagnoses:   Right shoulder avascular necrosis and increasing pain.   Discharge Diagnoses:  Right shoulder avascular necrosis and increasing pain.    Past Medical History:  Diagnosis Date   Anemia    Anxiety    BPH (benign prostatic hyperplasia)    Chronic pain    Depression    Dysphagia    Essential hypertension    Essential tremor    Facial paralysis on left side    GERD (gastroesophageal reflux disease)    Hemiplegia and hemiparesis following cerebral infarction affecting left non-dominant side ([place])    History of brain tumor    History of COVID-19 08/11/2022   Hyperlipidemia    Insomnia    Left leg weakness    S/P stroke   Neuropathy    OA (osteoarthritis)    OAB (overactive bladder)    Seizure disorder ([place])    Sinus tachycardia    Stroke [facility])    had brain tumor, and had CVAs during operation for his brain tumor    PCP: Noreene Larsson, NP   Discharged Condition: stable  Hospital Course:  Patient underwent the above stated procedure on 10/29/2022. Patient tolerated the procedure well and brought to the recovery room in good condition and subsequently to the floor. Patient had an uncomplicated hospital course. He worked with PT/OT on POD #1 and was meeting his goals. He was stable for discharge.   Disposition: Discharge disposition: 03-Skilled Nursing Facility      with follow up in 2 weeks    Follow-up Information     Netta Cedars, MD. Call in 2 week(s).   Specialty: Orthopedic Surgery Why: call [phone] for appt in two weeks with Dr Almon Hercules information: [address] [place] [address] [place] [address] [id number]                Discharge  Instructions     Call MD / Call 911   Complete by: As directed    If you experience chest pain or shortness of breath, CALL 911 and be transported to the hospital emergency room.  If you develope a fever above 101 F, pus (white drainage) or increased drainage or redness at the wound, or calf pain, call your surgeon's office.   Constipation Prevention   Complete by: As directed    Drink plenty of fluids.  Prune juice may be helpful.  You may use a stool softener, such as Colace (over the counter) 100 mg twice a day.  Use MiraLax (over the counter) for constipation as needed.   Diet - low sodium heart healthy   Complete by: As directed    Increase activity slowly as tolerated   Complete by: As directed    Post-operative opioid taper instructions:   Complete by: As directed    POST-OPERATIVE OPIOID TAPER INSTRUCTIONS: It is important to wean off of your opioid medication as soon as possible. If you do not need pain medication after your surgery it is ok to stop day one. Opioids include: Codeine, Hydrocodone(Norco, Vicodin), Oxycodone(Percocet, oxycontin) and hydromorphone amongst others.  Long term and even short term use of opiods can cause: Increased pain response Dependence Constipation Depression Respiratory depression And more.  Withdrawal symptoms can include Flu like symptoms Nausea, vomiting  And more Techniques to manage these symptoms Hydrate well Eat regular healthy meals Stay active Use relaxation techniques(deep breathing, meditating, yoga) Do Not substitute Alcohol to help with tapering If you have been on opioids for less than two weeks and do not have pain than it is ok to stop all together.  Plan to wean off of opioids This plan should start within one week post op of your joint replacement. Maintain the same interval or time between taking each dose and first decrease the dose.  Cut the total daily intake of opioids by one tablet each day Next start to increase  the time between doses. The last dose that should be eliminated is the evening dose.          Allergies as of 10/30/2022       Reactions   Penicillins    Per MAR         Medication List     TAKE these medications    Artificial Tears 1 % ophthalmic solution Generic drug: carboxymethylcellulose Place 1 drop into both eyes in the morning, at noon, in the evening, and at bedtime.   aspirin 81 MG chewable tablet Chew 81 mg by mouth daily.   atorvastatin 40 MG tablet Commonly known as: LIPITOR Take 40 mg by mouth daily.   azelastine 0.1 % nasal spray Commonly known as: ASTELIN Place 2 sprays into both nostrils 2 (two) times daily. Use in each nostril as directed   bacitracin 500 UNIT/GM ointment Apply 1 Application topically See admin instructions. Apply to right shin DAILY for wound care, and as needed   Baclofen 5 MG Tabs Take 15 mg by mouth in the morning, at noon, and at bedtime.   busPIRone 10 MG tablet Commonly known as: BUSPAR Take 10 mg by mouth 3 (three) times daily.   celecoxib 200 MG capsule Commonly known as: CELEBREX Take 1 capsule (200 mg total) by mouth daily.   Fish Oil 1000 MG Caps Take 1,000 mg by mouth daily.   fluticasone 50 MCG/ACT nasal spray Commonly known as: FLONASE Place 1 spray into both nostrils daily.   gabapentin 100 MG capsule Commonly known as: NEURONTIN Take 1 capsule (100 mg total) by mouth at bedtime. What changed:  how much to take when to take this additional instructions   lidocaine 4 % Apply 1 patch topically daily. Apply to left hip in the morning after shower, remove per schedule   melatonin 5 MG Tabs Take 5 mg by mouth at bedtime.   mirabegron ER 25 MG Tb24 tablet Commonly known as: MYRBETRIQ Take 1 tablet (25 mg total) by mouth daily.   nortriptyline 10 MG capsule Commonly known as: PAMELOR Take 50 mg by mouth at bedtime.   oxycodone 5 MG capsule Commonly known as: OXY-IR Take 1 capsule (5 mg total)  by mouth every 6 (six) hours as needed for pain.   pantoprazole 40 MG tablet Commonly known as: PROTONIX Take 40 mg by mouth daily.   propranolol 10 MG tablet Commonly known as: INDERAL Take 10 mg by mouth 2 (two) times daily.   QUEtiapine 200 MG tablet Commonly known as: SEROQUEL Take 200 mg by mouth at bedtime.   senna-docusate 8.6-50 MG tablet Commonly known as: Senokot-S Take 2 tablets by mouth at bedtime. What changed:  how much to take when to take this   sertraline 50 MG tablet Commonly known as: ZOLOFT Take 25 mg by mouth daily.   Systane 0.4-0.3 % Soln Generic drug:  Polyethyl Glycol-Propyl Glycol Place 1 drop into both eyes at bedtime. There is a 2nd order in [facility] stating "Instill 1 drop into left eye at bedtime for exposure keratitis"   tamsulosin 0.4 MG Caps capsule Commonly known as: FLOMAX Take 0.4 mg by mouth daily.          Signed: R. Jaynie Bream, PA-C Orthopedic Surgery 10/30/2022, 1:58 PM  [facility] is now [employer] [address], [place], [place], [place] [address] Phone: [place]

## 2022-10-30 NOTE — Progress Notes (Addendum)
   Subjective: 1 Day Post-Op Procedure(s) (LRB): REVERSE SHOULDER ARTHROPLASTY (Right) Patient seen in rounds for Dr. Veverly Fells. Patient is well, and has had no acute complaints or problems. Denies SOB or chest pain. Patient reports pain as  moderate to severe. Rated pain an 8/10 .  Objective: Vital signs in last 24 hours: Temp:  [97.5 F (36.4 C)-98.5 F (36.9 C)] 97.8 F (36.6 C) (03/09 0544) Pulse Rate:  [84-108] 84 (03/09 0544) Resp:  [14-18] 18 (03/09 0544) BP: (78-139)/(55-105) 114/74 (03/09 0544) SpO2:  [87 %-98 %] 87 % (03/09 0544) Weight:  [89.9 kg] 89.9 kg (03/08 0955)  Intake/Output from previous day:  Intake/Output Summary (Last 24 hours) at 10/30/2022 0914 Last data filed at 10/30/2022 2423 Gross per 24 hour  Intake 2925.48 ml  Output 1850 ml  Net 1075.48 ml     Intake/Output this shift: Total I/O In: -  Out: 500 [Urine:500]  Labs: Recent Labs    10/29/22 0938 10/30/22 0357  HGB 15.6 12.5*   Recent Labs    10/29/22 0938 10/30/22 0357  WBC 9.1  --   RBC 5.00  --   HCT 46.3 37.9*  PLT 229  --    Recent Labs    10/29/22 0938 10/30/22 0357  NA 139 138  K 4.0 3.9  CL 105 104  CO2 25 25  BUN 18 14  CREATININE 0.64 0.60*  GLUCOSE 110* 118*  CALCIUM 9.1 8.6*   No results for input(s): "LABPT", "INR" in the last 72 hours.  Exam: General - Patient is Alert and Oriented Extremity - Neurologically intact Neurovascular intact Sensation intact distally Intact pulses distally Dressing - dressing C/D/I Motor Function - intact, moving hand and fingers well on exam.  Past Medical History:  Diagnosis Date   Anemia    Anxiety    BPH (benign prostatic hyperplasia)    Chronic pain    Depression    Dysphagia    Essential hypertension    Essential tremor    Facial paralysis on left side    GERD (gastroesophageal reflux disease)    Hemiplegia and hemiparesis following cerebral infarction affecting left non-dominant side ([facility])    History of brain  tumor    History of COVID-19 08/11/2022   Hyperlipidemia    Insomnia    Left leg weakness    S/P stroke   Neuropathy    OA (osteoarthritis)    OAB (overactive bladder)    Seizure disorder ([facility])    Sinus tachycardia    Stroke ([place])    had brain tumor, and had CVAs during operation for his brain tumor    Assessment/Plan: 1 Day Post-Op Procedure(s) (LRB): REVERSE SHOULDER ARTHROPLASTY (Right) Principal Problem:   S/P shoulder replacement, right  Estimated body mass index is 25.43 kg/m as calculated from the following:   Height as of this encounter: 6\' 2"  (1.88 m).   Weight as of this encounter: 89.9 kg. Advance diet Up with therapy D/C IV fluids   DVT Prophylaxis - Aspirin  Plan for OT to work with patient today. When cleared, able to discharge back to SNF (long-term resident). Consulted social work to help with disposition planning as patient will need ride. Scripts have been printed by Dr. Veverly Fells and are in chart.  R. Jaynie Bream, PA-C Orthopedic Surgery 10/30/2022, 9:14 AM

## 2022-10-30 NOTE — Evaluation (Signed)
Occupational Therapy Evaluation Patient Details Name: Roger Morton MRN: [id number] DOB: [birth year] Today's Date: 10/30/2022   History of Present Illness Patient s/p right reverse shoulder arthroplasty.   Clinical Impression   Roger Morton is a 52 year old man who presents s/p reverse shoulder arthroplasty of right dominant upper extremity. . Therapist provided education and instruction to patient in regards to NWB status and need for wearing sling. Patient's RUE still under effects of block and painful. Patient mod assist to transfer to edge of bed and mod x 2 to stand and pivot to recliner. He is very unsteady in standing - with just one hand on walker. Patient is typically mod I from wheelchair but needing significant assist for ADLs and transfers. Recommend return to facility and initiate therapy services. Handout provided that provides information in regards to ROM restrictions and how he is able to use that arm.      Recommendations for follow up therapy are one component of a multi-disciplinary discharge planning process, led by the attending physician.  Recommendations may be updated based on patient status, additional functional criteria and insurance authorization.   Follow Up Recommendations  Skilled nursing-short term rehab (<3 hours/day)     Assistance Recommended at Discharge Frequent or constant Supervision/Assistance  Patient can return home with the following Two people to help with walking and/or transfers;A lot of help with bathing/dressing/bathroom;Assistance with cooking/housework;Direct supervision/assist for medications management;Direct supervision/assist for financial management;Help with stairs or ramp for entrance;Assist for transportation    Functional Status Assessment  Patient has had a recent decline in their functional status and demonstrates the ability to make significant improvements in function in a reasonable and predictable amount of time.   Equipment Recommendations  None recommended by OT    Recommendations for Other Services       Precautions / Restrictions Precautions Precautions: Shoulder Type of Shoulder Precautions: Sling For comfort and sleep   Non weight bearing Yes   AROM elbow, wrist and hand to tolerance Yes   AROM / PROM Forward Flexion 0-90   AROM / PROM Abduction 0-60   AROM / PROM External Rotation 0-30   OT Consult Special Instructions ok for ADLs and balance Shoulder Interventions: For comfort (and sleep) Required Braces or Orthoses: Sling Restrictions Weight Bearing Restrictions: Yes RUE Weight Bearing: Non weight bearing      Mobility Bed Mobility Overal bed mobility: Needs Assistance Bed Mobility: Supine to Sit     Supine to sit: Mod assist, HOB elevated          Transfers Overall transfer level: Needs assistance Equipment used: Rolling walker (2 wheels) Transfers: Sit to/from Stand, Bed to chair/wheelchair/BSC Sit to Stand: Mod assist, +2 physical assistance, From elevated surface Stand pivot transfers: Mod assist, +2 physical assistance         General transfer comment: Very unsteady iwth standing. Used one hand on walker but not able to really take functional steps.      Balance Overall balance assessment: Needs assistance Sitting-balance support: No upper extremity supported, Feet supported Sitting balance-Leahy Scale: Fair     Standing balance support: During functional activity Standing balance-Leahy Scale: Poor Standing balance comment: reliant on physical assist to maintani standing balance                           ADL either performed or assessed with clinical judgement   ADL Overall ADL's : Needs assistance/impaired Eating/Feeding: Set up;Sitting  Grooming: Set up;Sitting   Upper Body Bathing: Moderate assistance;Sitting   Lower Body Bathing: Moderate assistance;Sitting/lateral leans   Upper Body Dressing : Maximal assistance;Sitting   Lower  Body Dressing: Sit to/from stand;+2 for physical assistance;Total assistance   Toilet Transfer: Moderate assistance;+2 for physical assistance;BSC/3in1;Stand-pivot;Rolling walker (2 wheels)   Toileting- Clothing Manipulation and Hygiene: Sit to/from stand;+2 for physical assistance;Total assistance Toileting - Clothing Manipulation Details (indicate cue type and reason): assistance for pericare and clothing management     Functional mobility during ADLs: +2 for physical assistance;+2 for safety/equipment;Moderate assistance       Vision Patient Visual Report: No change from baseline       Perception     Praxis      Pertinent Vitals/Pain Pain Assessment Pain Assessment: Faces Faces Pain Scale: Hurts even more Pain Location: R shoulder Pain Descriptors / Indicators: Guarding, Grimacing, Sore Pain Intervention(s): Monitored during session     Hand Dominance Right   Extremity/Trunk Assessment Upper Extremity Assessment Upper Extremity Assessment: RUE deficits/detail RUE Deficits / Details: Impaired motor controll and sensation secondary to block, pain in shoulder   Lower Extremity Assessment Lower Extremity Assessment: Defer to PT evaluation   Cervical / Trunk Assessment Cervical / Trunk Assessment: Normal   Communication Communication Communication: Expressive difficulties   Cognition Arousal/Alertness: Awake/alert Behavior During Therapy: WFL for tasks assessed/performed Overall Cognitive Status: Within Functional Limits for tasks assessed                                       General Comments       Exercises     Shoulder Instructions      Home Living Family/patient expects to be discharged to:: Skilled nursing facility                                 Additional Comments: lives at [facility].      Prior Functioning/Environment               Mobility Comments: typically only transfers to wc but walks with  walker with therapy ADLs Comments: mod I from wc        OT Problem List: Decreased strength;Decreased range of motion;Decreased activity tolerance;Impaired balance (sitting and/or standing);Decreased knowledge of use of DME or AE;Decreased knowledge of precautions;Impaired UE functional use;Pain      OT Treatment/Interventions: Self-care/ADL training;Therapeutic exercise;DME and/or AE instruction;Therapeutic activities;Balance training;Patient/family education    OT Goals(Current goals can be found in the care plan section) Acute Rehab OT Goals Patient Stated Goal: to be able to do more for himself OT Goal Formulation: With patient Time For Goal Achievement: 11/13/22 Potential to Achieve Goals: Good  OT Frequency: Min 2X/week    Co-evaluation              AM-PAC OT "6 Clicks" Daily Activity     Outcome Measure Help from another person eating meals?: A Little Help from another person taking care of personal grooming?: A Little Help from another person toileting, which includes using toliet, bedpan, or urinal?: Total Help from another person bathing (including washing, rinsing, drying)?: A Lot Help from another person to put on and taking off regular upper body clothing?: A Lot Help from another person to put on and taking off regular lower body clothing?: Total 6 Click Score: 12   End of  Session Equipment Utilized During Treatment: Gait belt;Rolling walker (2 wheels) Nurse Communication: Mobility status  Activity Tolerance: Patient tolerated treatment well Patient left: in chair;with call bell/phone within reach;with chair alarm set  OT Visit Diagnosis: Unsteadiness on feet (R26.81);Pain Pain - Right/Left: Right Pain - part of body: Shoulder                Time: [id number] OT Time Calculation (min): 17 min Charges:  OT General Charges $OT Visit: 1 Visit OT Evaluation $OT Eval Low Complexity: 1 Low  Gustavo Lah, OTR/L [place]  Office [phone]    Lenward Chancellor 10/30/2022, 12:17 PM

## 2022-10-30 NOTE — NC FL2 (Signed)
[place] LEVEL OF CARE FORM     IDENTIFICATION  Patient Name: Roger Morton Birthdate: [birth year] Sex: male Admission Date (Current Location): 10/29/2022  [place] and [place] Number:  Kathleen Argue [id number] [place] and Address:  [facility],  [place], [place]      Provider Number: [id number]  Attending Physician Name and Address:  Netta Cedars, MD  Relative Name and Phone Number:  Mondre Crome (brother) Ph: [phone]    Current Level of Care: Hospital Recommended Level of Care: [place] Prior Approval Number:    Date Approved/Denied:   PASRR Number: [id number]  Discharge Plan: SNF    Current Diagnoses: Patient Active Problem List   Diagnosis Date Noted   S/P shoulder replacement, right 10/29/2022   Colon cancer screening    Pol[place] of transverse colon    Femur fracture ([place]) 02/15/2021   Leukocytosis 02/15/2021   Hyperlipidemia 02/15/2021   GERD (gastroesophageal reflux disease) 02/15/2021   Left-sided weakness 02/09/2021   Chronic pain 02/02/2021   Rash 02/02/2021   Encounter for general adult medical examination with abnormal findings 01/14/2021   Screening due 01/14/2021   Stroke ([place]) 01/14/2021   Seizure disorder ([place]) 01/14/2021   Chest pain 01/14/2021   Gait disorder 01/14/2021   Left knee pain 01/14/2021   Rhinorrhea 01/14/2021    Orientation RESPIRATION BLADDER Height & Weight     Self, Time, Situation, Place  Normal Continent Weight: 198 lb 1.6 oz (89.9 kg) Height:  '6\' 2"'$  (188 cm)  BEHAVIORAL SYMPTOMS/MOOD NEUROLOGICAL BOWEL NUTRITION STATUS    Convulsions/Seizures Continent Diet (Regular diet)  AMBULATORY STATUS COMMUNICATION OF NEEDS Skin   Extensive Assist Verbally Surgical wounds, Skin abrasions (Abrasion: left leg)                       Personal Care Assistance Level of Assistance  Bathing, Feeding, Dressing Bathing Assistance: Maximum assistance Feeding assistance:  Limited assistance Dressing Assistance: Maximum assistance     Functional Limitations Info  Sight, Hearing, Speech Sight Info: Adequate Hearing Info: Adequate Speech Info: Adequate    SPECIAL CARE FACTORS FREQUENCY                       Contractures Contractures Info: Not present    Additional Factors Info  Code Status, Allergies, Psychotropic Code Status Info: Full Allergies Info: Penicillins Psychotropic Info: Buspar, Seroquel, Zoloft         Current Medications (10/30/2022):  This is the current hospital active medication list Current Facility-Administered Medications  Medication Dose Route Frequency Provider Last Rate Last Admin   0.9 %  sodium chloride infusion   Intravenous Continuous Netta Cedars, MD 50 mL/hr at 10/29/22 1852 New Bag at 10/29/22 1852   acetaminophen (TYLENOL) tablet 325-650 mg  325-650 mg Oral Q6H PRN Netta Cedars, MD       aspirin chewable tablet 81 mg  81 mg Oral Daily Netta Cedars, MD   81 mg at 10/30/22 0826   atorvastatin (LIPITOR) tablet 40 mg  40 mg Oral Daily Netta Cedars, MD   40 mg at 10/30/22 [id number]   azelastine (ASTELIN) 0.1 % nasal spray 2 spray  2 spray Each Nare BID Netta Cedars, MD   2 spray at 10/30/22 0836   bacitracin ointment 1 Application  1 Application Topical Daily Netta Cedars, MD   1 Application at [id number] [phone]   baclofen (LIORESAL) tablet 15 mg  15 mg Oral TID Veverly Fells,  Richardson Landry, MD   15 mg at 10/30/22 0825   busPIRone (BUSPAR) tablet 10 mg  10 mg Oral TID Netta Cedars, MD   10 mg at 10/30/22 [id number]   celecoxib (CELEBREX) capsule 200 mg  200 mg Oral Daily Netta Cedars, MD   200 mg at 10/30/22 0827   docusate sodium (COLACE) capsule 100 mg  100 mg Oral BID Netta Cedars, MD   100 mg at 10/30/22 0828   fluticasone (FLONASE) 50 MCG/ACT nasal spray 1 spray  1 spray Each Nare Daily Netta Cedars, MD   1 spray at 10/30/22 [id number]   gabapentin (NEURONTIN) capsule 300 mg  300 mg Oral Daily Netta Cedars, MD   300 mg at 10/30/22 0825    gabapentin (NEURONTIN) capsule 600 mg  600 mg Oral Donavan Burnet, MD   600 mg at 10/29/22 2129   HYDROmorphone (DILAUDID) injection 0.5-1 mg  0.5-1 mg Intravenous Q4H PRN Netta Cedars, MD   0.5 mg at 10/30/22 0421   lidocaine (LIDODERM) 5 % 1 patch  1 patch Transdermal Q24H Netta Cedars, MD   1 patch at 10/30/22 [id number]   melatonin tablet 5 mg  5 mg Oral Donavan Burnet, MD   5 mg at 10/29/22 2130   menthol-cetylpyridinium (CEPACOL) lozenge 3 mg  1 lozenge Oral PRN Netta Cedars, MD       Or   phenol (CHLORASEPTIC) mouth spray 1 spray  1 spray Mouth/Throat PRN Netta Cedars, MD       methocarbamol (ROBAXIN) tablet 500 mg  500 mg Oral Q6H PRN Netta Cedars, MD       Or   methocarbamol (ROBAXIN) 500 mg in dextrose 5 % 50 mL IVPB  500 mg Intravenous Q6H PRN Netta Cedars, MD 110 mL/hr at 10/29/22 1522 500 mg at 10/29/22 1522   metoCLOPramide (REGLAN) tablet 5-10 mg  5-10 mg Oral Q8H PRN Netta Cedars, MD       Or   metoCLOPramide (REGLAN) injection 5-10 mg  5-10 mg Intravenous Q8H PRN Netta Cedars, MD       mirabegron ER [facility]) tablet 25 mg  25 mg Oral Daily Netta Cedars, MD   25 mg at 10/30/22 [id number]   nortriptyline (PAMELOR) capsule 50 mg  50 mg Oral Donavan Burnet, MD   50 mg at 10/29/22 2130   omega-3 acid ethyl esters (LOVAZA) capsule 1 g  1 g Oral Daily Netta Cedars, MD   1 g at 10/30/22 0826   ondansetron (ZOFRAN) tablet 4 mg  4 mg Oral Q6H PRN Netta Cedars, MD       Or   ondansetron [facility]) injection 4 mg  4 mg Intravenous Q6H PRN Netta Cedars, MD       oxyCODONE (Oxy IR/ROXICODONE) immediate release tablet 5-10 mg  5-10 mg Oral Q4H PRN Netta Cedars, MD   10 mg at 10/30/22 0840   pantoprazole (PROTONIX) EC tablet 40 mg  40 mg Oral Daily Netta Cedars, MD   40 mg at 10/30/22 0825   polyethylene glycol (MIRALAX / GLYCOLAX) packet 17 g  17 g Oral Daily PRN Netta Cedars, MD       polyvinyl alcohol (LIQUIFILM TEARS) 1.4 % ophthalmic solution 1 drop  1 drop Both Eyes TID  Netta Cedars, MD   1 drop at 10/30/22 0838   propranolol (INDERAL) tablet 10 mg  10 mg Oral BID Netta Cedars, MD   10 mg at 10/30/22 0828   QUEtiapine (SEROQUEL) tablet 200 mg  200 mg  Oral Donavan Burnet, MD   200 mg at 10/29/22 2129   senna-docusate (Senokot-S) tablet 2 tablet  2 tablet Oral Donavan Burnet, MD       sertraline (ZOLOFT) tablet 25 mg  25 mg Oral Daily Netta Cedars, MD   25 mg at 10/30/22 [id number]   tamsulosin (FLOMAX) capsule 0.4 mg  0.4 mg Oral Daily Netta Cedars, MD   0.4 mg at 10/30/22 [id number]     Discharge Medications: Please see discharge summary for a list of discharge medications.  Relevant Imaging Results:  Relevant Lab Results:   Additional Information SSN: [id number]  Sherie Don, LCSW

## 2022-10-30 NOTE — Evaluation (Signed)
Physical Therapy Evaluation Patient Details Name: Roger Morton MRN: [id number] DOB: 1971-02-11 Today's Date: 10/30/2022  History of Present Illness  Patient s/p right reverse shoulder arthroplasty. Pt with hx of BPH, chronic pain, L hip fx with IM nail in 2022; Brain tumor s/p resection with CVA during surgery and now with significant residual L side deficits.  Clinical Impression  Pt admitted as above and presenting with functional mobility limitations 2* limited use of R UE post-op, balance deficits and residual L side strength/coordination deficits related to prior CVA.  Pt eager for return to prior SNF.     Recommendations for follow up therapy are one component of a multi-disciplinary discharge planning process, led by the attending physician.  Recommendations may be updated based on patient status, additional functional criteria and insurance authorization.  Follow Up Recommendations Skilled nursing-short term rehab (<3 hours/day) Can patient physically be transported by private vehicle: No    Assistance Recommended at Discharge Frequent or constant Supervision/Assistance  Patient can return home with the following  A lot of help with walking and/or transfers;A lot of help with bathing/dressing/bathroom;Assistance with cooking/housework;Assist for transportation;Help with stairs or ramp for entrance    Equipment Recommendations None recommended by PT  Recommendations for Other Services       Functional Status Assessment Patient has had a recent decline in their functional status and demonstrates the ability to make significant improvements in function in a reasonable and predictable amount of time.     Precautions / Restrictions Precautions Precautions: Shoulder;Fall Type of Shoulder Precautions: Sling For comfort and sleep   Non weight bearing Yes   AROM elbow, wrist and hand to tolerance Yes   AROM / PROM Forward Flexion 0-90   AROM / PROM Abduction 0-60   AROM / PROM  External Rotation 0-30   OT Consult Special Instructions ok for ADLs and balance Shoulder Interventions: For comfort Precaution Comments: high risk of falls with transfers Required Braces or Orthoses: Sling Restrictions Weight Bearing Restrictions: Yes RUE Weight Bearing: Non weight bearing      Mobility  Bed Mobility Overal bed mobility: Needs Assistance Bed Mobility: Supine to Sit     Supine to sit: Mod assist, HOB elevated          Transfers Overall transfer level: Needs assistance Equipment used: Rolling walker (2 wheels) Transfers: Sit to/from Stand, Bed to chair/wheelchair/BSC Sit to Stand: Mod assist, +2 physical assistance, From elevated surface   Step pivot transfers: Mod assist, +2 physical assistance, +2 safety/equipment, From elevated surface       General transfer comment: Very unsteady iwth standing. Used one hand on walker but difficulty advanding R LE and WB appropirately on L LE/UE to compensate    Ambulation/Gait               General Gait Details: NA for safety  Stairs            Wheelchair Mobility    Modified Rankin (Stroke Patients Only)       Balance Overall balance assessment: Needs assistance Sitting-balance support: No upper extremity supported, Feet supported Sitting balance-Leahy Scale: Fair     Standing balance support: During functional activity Standing balance-Leahy Scale: Poor Standing balance comment: reliant on physical assist to maintain standing balance                             Pertinent Vitals/Pain Pain Assessment Pain Assessment: Faces Faces Pain Scale: Hurts even  more Pain Location: R shoulder Pain Descriptors / Indicators: Guarding, Grimacing, Sore Pain Intervention(s): Limited activity within patient's tolerance, Monitored during session, Premedicated before session, Ice applied    Home Living Family/patient expects to be discharged to:: Skilled nursing facility                    Additional Comments: lives at [facility].    Prior Function               Mobility Comments: typically only transfers to wc but walks with walker with therapy ADLs Comments: mod I from wc     Hand Dominance   Dominant Hand: Right    Extremity/Trunk Assessment   Upper Extremity Assessment Upper Extremity Assessment: RUE deficits/detail;LUE deficits/detail RUE Deficits / Details: Impaired motor controll and sensation secondary to block, pain in shoulder LUE Coordination: decreased fine motor;decreased gross motor    Lower Extremity Assessment Lower Extremity Assessment: LLE deficits/detail LLE Deficits / Details: Residual deficits from prior CVA LLE Coordination: decreased fine motor;decreased gross motor    Cervical / Trunk Assessment Cervical / Trunk Assessment: Normal  Communication   Communication: Expressive difficulties  Cognition Arousal/Alertness: Awake/alert Behavior During Therapy: WFL for tasks assessed/performed Overall Cognitive Status: Within Functional Limits for tasks assessed                                          General Comments      Exercises     Assessment/Plan    PT Assessment Patient needs continued PT services  PT Problem List Decreased strength;Decreased range of motion;Decreased activity tolerance;Decreased balance;Decreased mobility;Decreased knowledge of use of DME;Pain;Decreased safety awareness       PT Treatment Interventions DME instruction;Gait training;Functional mobility training;Therapeutic activities;Therapeutic exercise;Balance training;Patient/family education    PT Goals (Current goals can be found in the Care Plan section)  Acute Rehab PT Goals Patient Stated Goal: Regain IND PT Goal Formulation: With patient Time For Goal Achievement: 11/13/22 Potential to Achieve Goals: Fair    Frequency Min 3X/week     Co-evaluation PT/OT/SLP Co-Evaluation/Treatment: Yes Reason for  Co-Treatment: For patient/therapist safety PT goals addressed during session: Mobility/safety with mobility OT goals addressed during session: ADL's and self-care       AM-PAC PT "6 Clicks" Mobility  Outcome Measure Help needed turning from your back to your side while in a flat bed without using bedrails?: A Lot Help needed moving from lying on your back to sitting on the side of a flat bed without using bedrails?: A Lot Help needed moving to and from a bed to a chair (including a wheelchair)?: A Lot Help needed standing up from a chair using your arms (e.g., wheelchair or bedside chair)?: A Lot Help needed to walk in hospital room?: Total Help needed climbing 3-5 steps with a railing? : Total 6 Click Score: 10    End of Session Equipment Utilized During Treatment: Gait belt Activity Tolerance: Patient tolerated treatment well Patient left: in chair;with call bell/phone within reach;with chair alarm set Nurse Communication: Mobility status PT Visit Diagnosis: Difficulty in walking, not elsewhere classified (R26.2);Hemiplegia and hemiparesis Hemiplegia - Right/Left: Left Hemiplegia - dominant/non-dominant: Non-dominant    Time: [id number] PT Time Calculation (min) (ACUTE ONLY): 17 min   Charges:   PT Evaluation $PT Eval Low Complexity: 1 Low          Debe Coder PT  Acute Rehabilitation Services Pager [phone] Office [phone]   [facility] 10/30/2022, 12:54 PM

## 2022-10-30 NOTE — Progress Notes (Signed)
Physical Therapy Treatment Patient Details Name: Roger Morton MRN: [id number] DOB: [birth year] Today's Date: 10/30/2022   History of Present Illness Patient s/p right reverse shoulder arthroplasty. Pt with hx of BPH, chronic pain, L hip fx with IM nail in 2022; Brain tumor s/p resection with CVA during surgery and now with significant residual L side deficits.    PT Comments    Pt continues very cooperative and mildly impulsive.  Pt assist to stand from recliner and step pvt to bedside with L hand supported on RW.  Pt continues limited by non use of R UE and residual L side deficits from prior CVA.  Pt eager for return to prior SNF   Recommendations for follow up therapy are one component of a multi-disciplinary discharge planning process, led by the attending physician.  Recommendations may be updated based on patient status, additional functional criteria and insurance authorization.  Follow Up Recommendations  Skilled nursing-short term rehab (<3 hours/day) Can patient physically be transported by private vehicle: No   Assistance Recommended at Discharge Frequent or constant Supervision/Assistance  Patient can return home with the following A lot of help with walking and/or transfers;A lot of help with bathing/dressing/bathroom;Assistance with cooking/housework;Assist for transportation;Help with stairs or ramp for entrance   Equipment Recommendations  None recommended by PT    Recommendations for Other Services       Precautions / Restrictions Precautions Precautions: Shoulder;Fall Type of Shoulder Precautions: Sling For comfort and sleep   Non weight bearing Yes   AROM elbow, wrist and hand to tolerance Yes   AROM / PROM Forward Flexion 0-90   AROM / PROM Abduction 0-60   AROM / PROM External Rotation 0-30   OT Consult Special Instructions ok for ADLs and balance Shoulder Interventions: For comfort Precaution Comments: high risk of falls with transfers Required Braces or  Orthoses: Sling Restrictions Weight Bearing Restrictions: Yes RUE Weight Bearing: Non weight bearing     Mobility  Bed Mobility Overal bed mobility: Needs Assistance Bed Mobility: Sit to Supine     Supine to sit: Mod assist, HOB elevated Sit to supine: Min assist        Transfers Overall transfer level: Needs assistance Equipment used: Rolling walker (2 wheels) Transfers: Sit to/from Stand, Bed to chair/wheelchair/BSC Sit to Stand: Mod assist, +2 physical assistance, From elevated surface   Step pivot transfers: Mod assist, +2 physical assistance, +2 safety/equipment, From elevated surface       General transfer comment: Very unsteady iwth standing. Used one hand on walker but difficulty advanding R LE and WB appropirately on L LE/UE to compensate    Ambulation/Gait               General Gait Details: NA for safety   Stairs             Wheelchair Mobility    Modified Rankin (Stroke Patients Only)       Balance Overall balance assessment: Needs assistance Sitting-balance support: No upper extremity supported, Feet supported Sitting balance-Leahy Scale: Fair     Standing balance support: During functional activity Standing balance-Leahy Scale: Poor Standing balance comment: reliant on physical assist to maintain standing balance                            Cognition Arousal/Alertness: Awake/alert Behavior During Therapy: WFL for tasks assessed/performed Overall Cognitive Status: Within Functional Limits for tasks assessed  Exercises      General Comments        Pertinent Vitals/Pain Pain Assessment Pain Assessment: Faces Faces Pain Scale: Hurts even more Pain Location: R shoulder Pain Descriptors / Indicators: Guarding, Grimacing, Sore Pain Intervention(s): Limited activity within patient's tolerance, Monitored during session, Premedicated before session    [place] expects to be discharged to:: Skilled nursing facility                   Additional Comments: lives at [facility].    Prior Function            PT Goals (current goals can now be found in the care plan section) Acute Rehab PT Goals Patient Stated Goal: Regain IND PT Goal Formulation: With patient Time For Goal Achievement: 11/13/22 Potential to Achieve Goals: Fair Progress towards PT goals: Progressing toward goals    Frequency    Min 3X/week      PT Plan Current plan remains appropriate    Co-evaluation PT/OT/SLP Co-Evaluation/Treatment: Yes Reason for Co-Treatment: For patient/therapist safety PT goals addressed during session: Mobility/safety with mobility OT goals addressed during session: ADL's and self-care      AM-PAC PT "6 Clicks" Mobility   Outcome Measure  Help needed turning from your back to your side while in a flat bed without using bedrails?: A Lot Help needed moving from lying on your back to sitting on the side of a flat bed without using bedrails?: A Lot Help needed moving to and from a bed to a chair (including a wheelchair)?: A Lot Help needed standing up from a chair using your arms (e.g., wheelchair or bedside chair)?: A Lot Help needed to walk in hospital room?: Total Help needed climbing 3-5 steps with a railing? : Total 6 Click Score: 10    End of Session Equipment Utilized During Treatment: Gait belt Activity Tolerance: Patient tolerated treatment well Patient left: with call bell/phone within reach;with bed alarm set Nurse Communication: Mobility status PT Visit Diagnosis: Difficulty in walking, not elsewhere classified (R26.2);Hemiplegia and hemiparesis Hemiplegia - Right/Left: Left Hemiplegia - dominant/non-dominant: Non-dominant     Time: [id number] PT Time Calculation (min) (ACUTE ONLY): 13 min  Charges:  $Therapeutic Activity: 8-22 mins                     [place] Pager [phone] Office [phone]    Kamisha Ell 10/30/2022, 1:02 PM

## 2022-11-01 NOTE — Anesthesia Postprocedure Evaluation (Signed)
Anesthesia Post Note  Patient: Roger Morton  Procedure(s) Performed: REVERSE SHOULDER ARTHROPLASTY (Right: Shoulder)     Patient location during evaluation: PACU Anesthesia Type: General Level of consciousness: awake and alert Pain management: pain level controlled Vital Signs Assessment: post-procedure vital signs reviewed and stable Respiratory status: spontaneous breathing, nonlabored ventilation and respiratory function stable Cardiovascular status: blood pressure returned to baseline and stable Postop Assessment: no apparent nausea or vomiting Anesthetic complications: no   No notable events documented.  Last Vitals:  Vitals:   10/30/22 0934 10/30/22 1330  BP: 115/76 (!) 137/92  Pulse: 88 94  Resp: 20 17  Temp: 37.4 C 37 C  SpO2: 91% 93%    Last Pain:  Vitals:   10/30/22 1513  TempSrc:   PainSc: [place]

## 2022-11-03 ENCOUNTER — Encounter (HOSPITAL_COMMUNITY): Payer: Self-pay | Admitting: Orthopedic Surgery

## 2023-01-20 IMAGING — CT CT ANGIO CHEST
2 of 6 series · 16 of 46 positions shown · IV contrast (omnipaque)
Comparison: Radiograph 02/18/2021

CLINICAL DATA: Chest pain, shortness of breath, possible fall

EXAM:
CT ANGIOGRAPHY CHEST WITH CONTRAST
TECHNIQUE: Multidetector CT imaging of the chest was performed using the
standard protocol during bolus administration of intravenous
contrast. Multiplanar CT image reconstructions and MIPs were
obtained to evaluate the vascular anatomy.
CONTRAST:  75mL OMNIPAQUE IOHEXOL 350 MG/ML SOLN

[Series 6: thins · axial · 0.85mm/px · z∈[+46,+292]mm · 13 of 270 slices shown]
[im 12/270  lung]
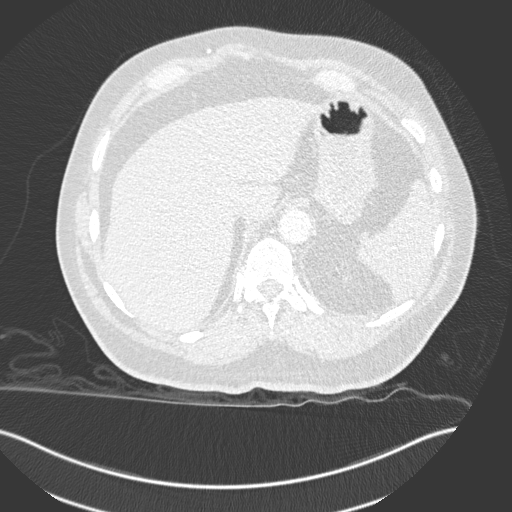
[im 36/270  soft-tissue]
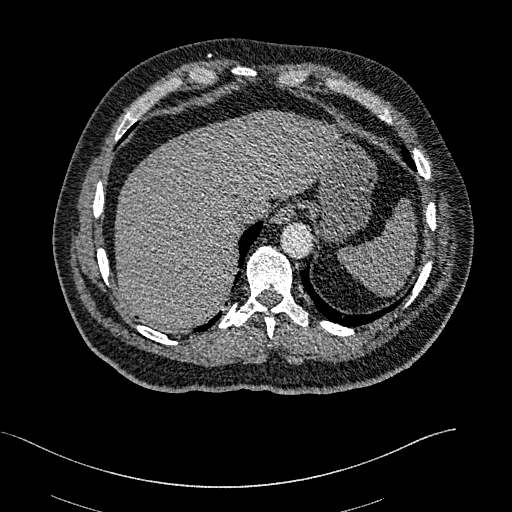
[im 59/270  lung]
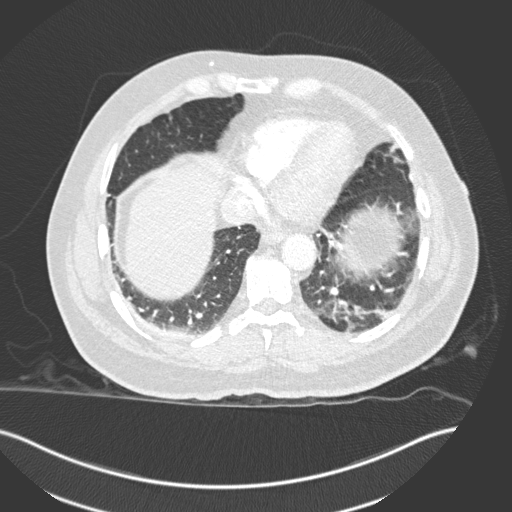
[im 71/270  soft-tissue]
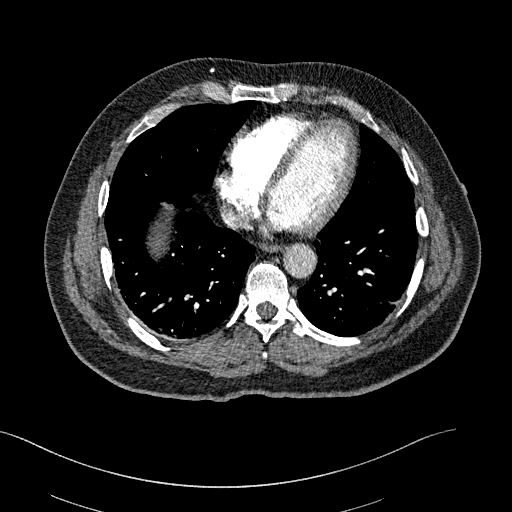
[im 94/270  lung]
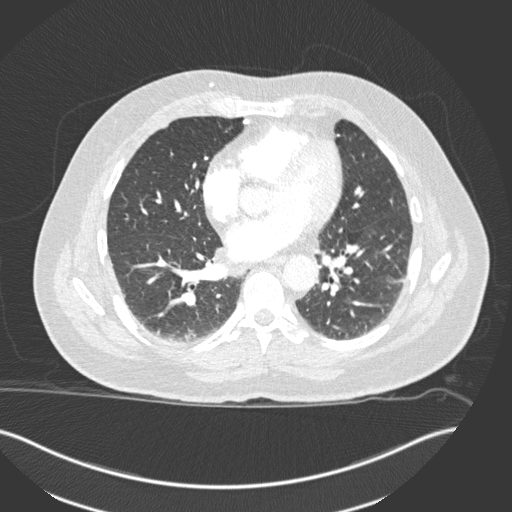
[im 117/270  soft-tissue]
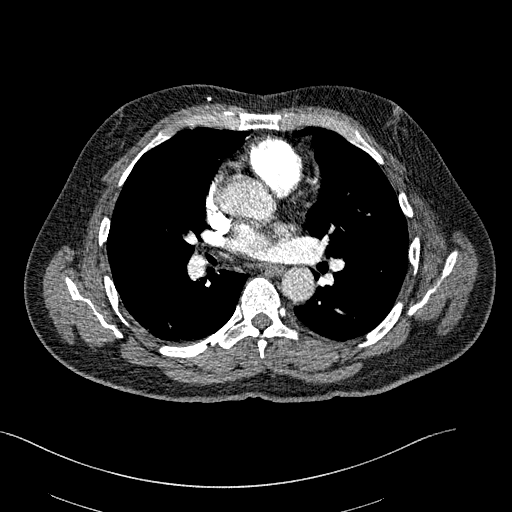
[im 141/270  lung]
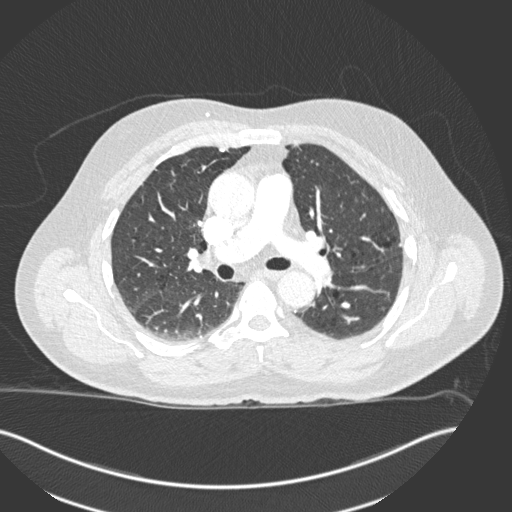
[im 153/270  soft-tissue]
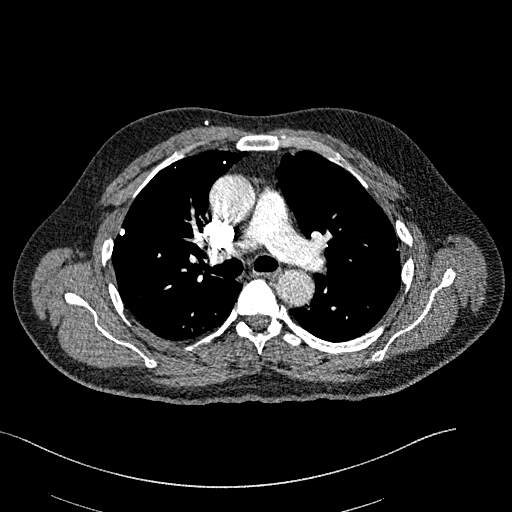
[im 176/270  lung]
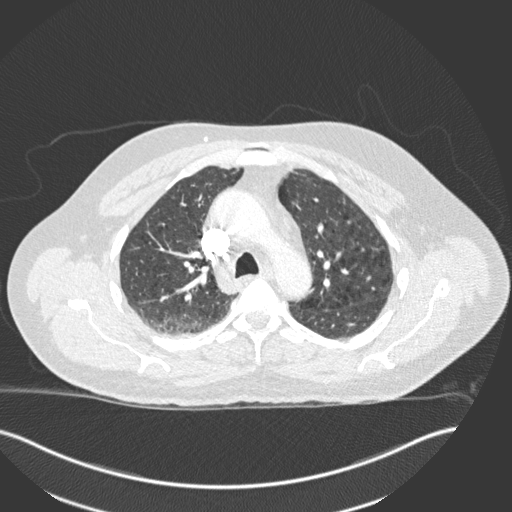
[im 199/270  soft-tissue]
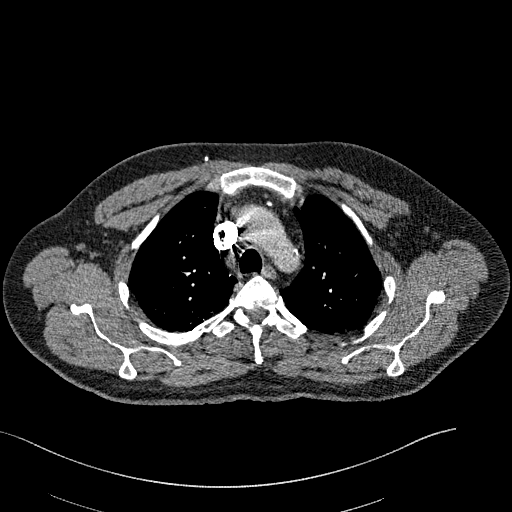
[im 211/270  lung]
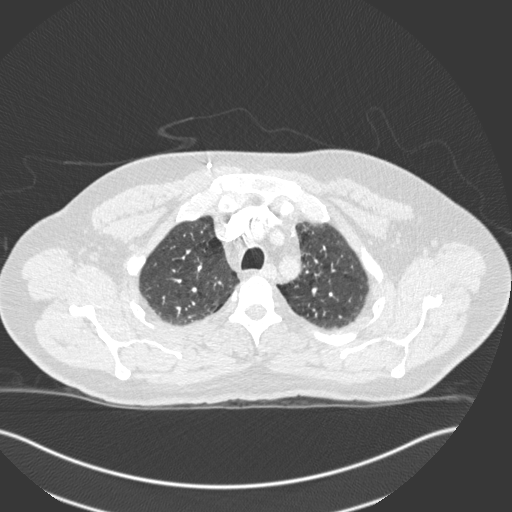
[im 234/270  soft-tissue]
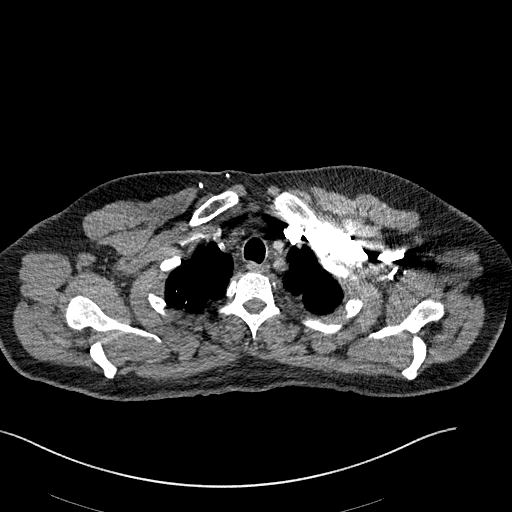
[im 258/270  lung]
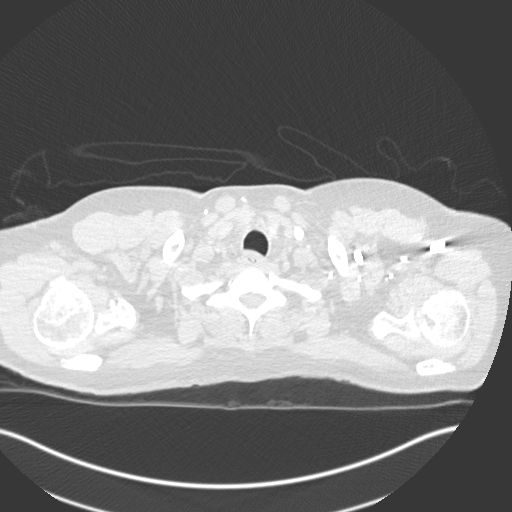

[Series 8: coronal mpr · coronal · 0.59mm/px · 3 of 151 slices shown]
[im 38/151  soft-tissue]
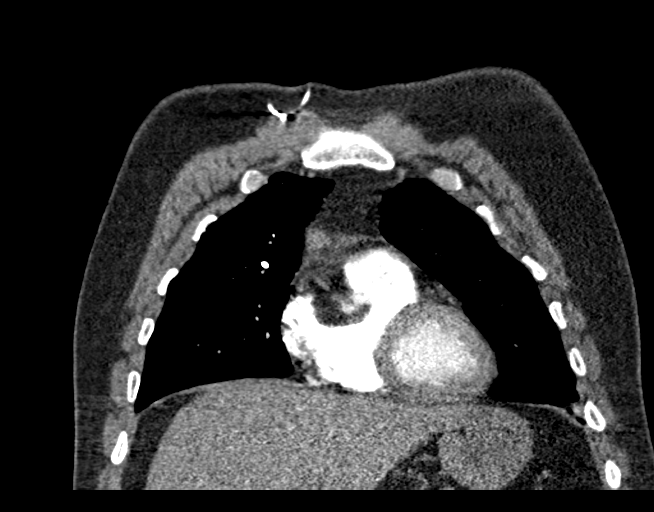
[im 76/151  soft-tissue]
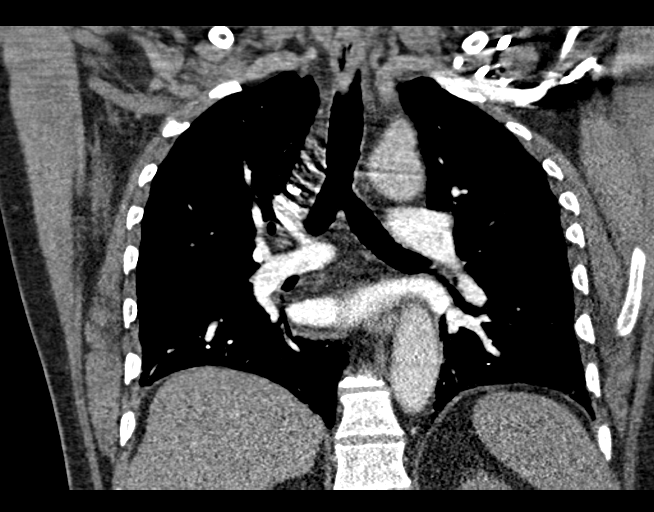
[im 113/151  soft-tissue]
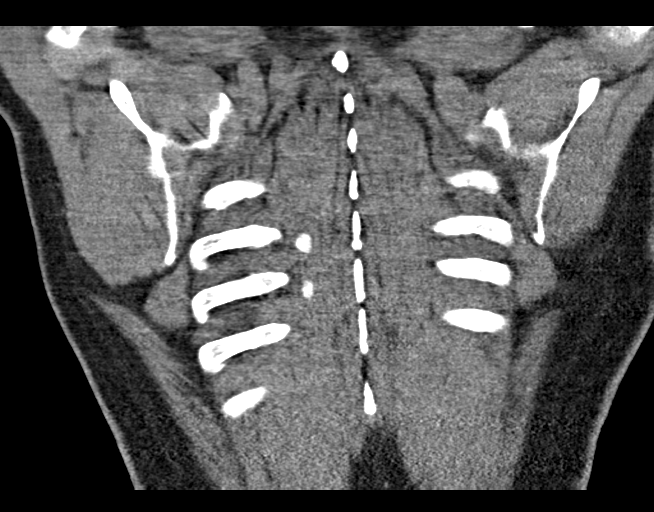

[16 of 46 positions shown; findings below may reference images not displayed]

FINDINGS: Cardiovascular: Evaluation is tailored for the assessment of the
pulmonary arteries. Satisfactory opacification of the pulmonary
arteries to the lobar level with more distal segmental and
subsegmental evaluation limited by suboptimal contrast timing. No
large central or lobar filling defects are identified central
pulmonary arteries are normal caliber. Non tailored evaluation of
the aorta. The aortic root is suboptimally assessed given cardiac
pulsation artifact. No gross acute luminal abnormality of the aorta
is visible. Minimal atherosclerotic plaque within the normal caliber
aorta. Shared origin of the brachiocephalic and left common carotid
arteries. Minimal plaque in the proximal great vessels. No major
venous abnormalities. Mixing artifact in the SVC. Normal heart size.
No pericardial effusion.

Mediastinum/Nodes: No mediastinal fluid or gas. Normal thyroid gland
and thoracic inlet. No acute abnormality of the trachea or
esophagus. No worrisome mediastinal, hilar or axillary adenopathy.
Subcentimeter partially calcified node towards the diaphragmatic
hiatus.

Lungs/Pleura: Numerous calcified nodules throughout both lungs, most
pronounced anteriorly and in the upper lobes, likely calcified
granulomata as sequela of prior granulomatous disease. Largest
nodule seen in the right upper lobe measuring up to 13 mm in size.
Background of centrilobular and paraseptal emphysematous change. No
acute consolidation. No CT evidence of edema. No other concerning
pulmonary nodules or masses. Bandlike opacities in the lung bases,
likely atelectasis and/or scarring. Additional atelectatic changes
dependently.

Upper Abdomen: No acute abnormalities present in the visualized
portions of the upper abdomen.

Musculoskeletal: Degenerative changes are present in the imaged
spine and shoulders. No acute osseous abnormality or suspicious
osseous lesion. No visible displaced rib or sternal fractures. No
acute fracture or traumatic osseous injury of the included
shoulders. Degenerative changes in the bilateral shoulders with
evidence of prior avascular necrosis of the humeral heads. No
visible fracture or traumatic listhesis of the thoracic spine as
included. Catheter tubing noted at the base of the neck bilaterally
extending inferiorly across the right anterior chest wall within the
subcutaneous fat.

Review of the MIP images confirms the above findings.
IMPRESSION: 1. No evidence of pulmonary embolism to the lobar level with more
distal evaluation limited by suboptimal contrast timing.
2. No acute traumatic findings in the chest.
3. Numerous scattered calcified nodules throughout both lungs, most
pronounced anteriorly and in the upper lobes, as well as small
calcified lymph nodes towards the hiatus. Likely reflect sequela of
prior granulomatous disease in the chest.
4. Evidence of prior avascular necrosis of bilateral humeral heads.
Correlate with patient history. Associated degenerative changes in
the shoulders.
5. Centrilobular and paraseptal emphysema ( Emphysema
(EPGCV-RF3.E).)
6.  Aortic Atherosclerosis (EPGCV-NT6.6).
7. Subcutaneous shunt catheter tubing noted at the base of the neck
bilaterally and extending across the right anterior chest wall.

## 2023-01-24 DIAGNOSIS — M25552 Pain in left hip: Secondary | ICD-10-CM | POA: Diagnosis not present

## 2023-01-26 DIAGNOSIS — K219 Gastro-esophageal reflux disease without esophagitis: Secondary | ICD-10-CM | POA: Diagnosis not present

## 2023-01-26 DIAGNOSIS — Z79899 Other long term (current) drug therapy: Secondary | ICD-10-CM | POA: Diagnosis not present

## 2023-01-27 DIAGNOSIS — E785 Hyperlipidemia, unspecified: Secondary | ICD-10-CM | POA: Diagnosis not present

## 2023-01-27 DIAGNOSIS — G8929 Other chronic pain: Secondary | ICD-10-CM | POA: Diagnosis not present

## 2023-01-27 DIAGNOSIS — I10 Essential (primary) hypertension: Secondary | ICD-10-CM | POA: Diagnosis not present

## 2023-02-04 DIAGNOSIS — G8929 Other chronic pain: Secondary | ICD-10-CM | POA: Diagnosis not present

## 2023-02-07 DIAGNOSIS — G8929 Other chronic pain: Secondary | ICD-10-CM | POA: Diagnosis not present

## 2023-02-07 DIAGNOSIS — G3184 Mild cognitive impairment, so stated: Secondary | ICD-10-CM | POA: Diagnosis not present

## 2023-02-12 DIAGNOSIS — D649 Anemia, unspecified: Secondary | ICD-10-CM | POA: Diagnosis not present

## 2023-02-15 DIAGNOSIS — H02115 Cicatricial ectropion of left lower eyelid: Secondary | ICD-10-CM | POA: Diagnosis not present

## 2023-02-15 DIAGNOSIS — H02224 Mechanical lagophthalmos left upper eyelid: Secondary | ICD-10-CM | POA: Diagnosis not present

## 2023-02-21 DIAGNOSIS — M6281 Muscle weakness (generalized): Secondary | ICD-10-CM | POA: Diagnosis not present

## 2023-02-21 DIAGNOSIS — Z471 Aftercare following joint replacement surgery: Secondary | ICD-10-CM | POA: Diagnosis not present

## 2023-02-22 DIAGNOSIS — M6281 Muscle weakness (generalized): Secondary | ICD-10-CM | POA: Diagnosis not present

## 2023-02-22 DIAGNOSIS — Z471 Aftercare following joint replacement surgery: Secondary | ICD-10-CM | POA: Diagnosis not present

## 2023-02-24 DIAGNOSIS — Z471 Aftercare following joint replacement surgery: Secondary | ICD-10-CM | POA: Diagnosis not present

## 2023-02-24 DIAGNOSIS — M6281 Muscle weakness (generalized): Secondary | ICD-10-CM | POA: Diagnosis not present

## 2023-02-25 DIAGNOSIS — M6281 Muscle weakness (generalized): Secondary | ICD-10-CM | POA: Diagnosis not present

## 2023-02-25 DIAGNOSIS — Z471 Aftercare following joint replacement surgery: Secondary | ICD-10-CM | POA: Diagnosis not present

## 2023-02-28 DIAGNOSIS — Z471 Aftercare following joint replacement surgery: Secondary | ICD-10-CM | POA: Diagnosis not present

## 2023-02-28 DIAGNOSIS — M6281 Muscle weakness (generalized): Secondary | ICD-10-CM | POA: Diagnosis not present

## 2023-03-01 DIAGNOSIS — M6281 Muscle weakness (generalized): Secondary | ICD-10-CM | POA: Diagnosis not present

## 2023-03-01 DIAGNOSIS — Z471 Aftercare following joint replacement surgery: Secondary | ICD-10-CM | POA: Diagnosis not present

## 2023-03-02 DIAGNOSIS — Z471 Aftercare following joint replacement surgery: Secondary | ICD-10-CM | POA: Diagnosis not present

## 2023-03-02 DIAGNOSIS — M6281 Muscle weakness (generalized): Secondary | ICD-10-CM | POA: Diagnosis not present

## 2023-03-03 DIAGNOSIS — M62838 Other muscle spasm: Secondary | ICD-10-CM | POA: Diagnosis not present

## 2023-03-03 DIAGNOSIS — M6281 Muscle weakness (generalized): Secondary | ICD-10-CM | POA: Diagnosis not present

## 2023-03-03 DIAGNOSIS — R42 Dizziness and giddiness: Secondary | ICD-10-CM | POA: Diagnosis not present

## 2023-03-03 DIAGNOSIS — Z471 Aftercare following joint replacement surgery: Secondary | ICD-10-CM | POA: Diagnosis not present

## 2023-03-04 DIAGNOSIS — M545 Low back pain, unspecified: Secondary | ICD-10-CM | POA: Diagnosis not present

## 2023-03-04 DIAGNOSIS — M6281 Muscle weakness (generalized): Secondary | ICD-10-CM | POA: Diagnosis not present

## 2023-03-04 DIAGNOSIS — Z471 Aftercare following joint replacement surgery: Secondary | ICD-10-CM | POA: Diagnosis not present

## 2023-03-05 DIAGNOSIS — N390 Urinary tract infection, site not specified: Secondary | ICD-10-CM | POA: Diagnosis not present

## 2023-03-07 DIAGNOSIS — N390 Urinary tract infection, site not specified: Secondary | ICD-10-CM | POA: Diagnosis not present

## 2023-03-07 DIAGNOSIS — M6281 Muscle weakness (generalized): Secondary | ICD-10-CM | POA: Diagnosis not present

## 2023-03-07 DIAGNOSIS — Z471 Aftercare following joint replacement surgery: Secondary | ICD-10-CM | POA: Diagnosis not present

## 2023-03-07 DIAGNOSIS — G8929 Other chronic pain: Secondary | ICD-10-CM | POA: Diagnosis not present

## 2023-03-07 DIAGNOSIS — G3184 Mild cognitive impairment, so stated: Secondary | ICD-10-CM | POA: Diagnosis not present

## 2023-03-08 DIAGNOSIS — Z471 Aftercare following joint replacement surgery: Secondary | ICD-10-CM | POA: Diagnosis not present

## 2023-03-08 DIAGNOSIS — M6281 Muscle weakness (generalized): Secondary | ICD-10-CM | POA: Diagnosis not present

## 2023-03-10 DIAGNOSIS — M6281 Muscle weakness (generalized): Secondary | ICD-10-CM | POA: Diagnosis not present

## 2023-03-10 DIAGNOSIS — Z471 Aftercare following joint replacement surgery: Secondary | ICD-10-CM | POA: Diagnosis not present

## 2023-03-11 DIAGNOSIS — Z471 Aftercare following joint replacement surgery: Secondary | ICD-10-CM | POA: Diagnosis not present

## 2023-03-11 DIAGNOSIS — M6281 Muscle weakness (generalized): Secondary | ICD-10-CM | POA: Diagnosis not present

## 2023-03-12 DIAGNOSIS — I10 Essential (primary) hypertension: Secondary | ICD-10-CM | POA: Diagnosis not present

## 2023-03-12 DIAGNOSIS — Z471 Aftercare following joint replacement surgery: Secondary | ICD-10-CM | POA: Diagnosis not present

## 2023-03-12 DIAGNOSIS — M6281 Muscle weakness (generalized): Secondary | ICD-10-CM | POA: Diagnosis not present

## 2023-03-13 DIAGNOSIS — Z471 Aftercare following joint replacement surgery: Secondary | ICD-10-CM | POA: Diagnosis not present

## 2023-03-13 DIAGNOSIS — M6281 Muscle weakness (generalized): Secondary | ICD-10-CM | POA: Diagnosis not present

## 2023-03-14 DIAGNOSIS — F32A Depression, unspecified: Secondary | ICD-10-CM | POA: Diagnosis not present

## 2023-03-14 DIAGNOSIS — R251 Tremor, unspecified: Secondary | ICD-10-CM | POA: Diagnosis not present

## 2023-03-14 DIAGNOSIS — G479 Sleep disorder, unspecified: Secondary | ICD-10-CM | POA: Diagnosis not present

## 2023-03-14 DIAGNOSIS — R42 Dizziness and giddiness: Secondary | ICD-10-CM | POA: Diagnosis not present

## 2023-03-14 DIAGNOSIS — R262 Difficulty in walking, not elsewhere classified: Secondary | ICD-10-CM | POA: Diagnosis not present

## 2023-03-14 DIAGNOSIS — M62838 Other muscle spasm: Secondary | ICD-10-CM | POA: Diagnosis not present

## 2023-03-14 DIAGNOSIS — G8194 Hemiplegia, unspecified affecting left nondominant side: Secondary | ICD-10-CM | POA: Diagnosis not present

## 2023-03-14 DIAGNOSIS — D496 Neoplasm of unspecified behavior of brain: Secondary | ICD-10-CM | POA: Diagnosis not present

## 2023-03-14 DIAGNOSIS — R519 Headache, unspecified: Secondary | ICD-10-CM | POA: Diagnosis not present

## 2023-03-14 DIAGNOSIS — R531 Weakness: Secondary | ICD-10-CM | POA: Diagnosis not present

## 2023-03-14 DIAGNOSIS — R569 Unspecified convulsions: Secondary | ICD-10-CM | POA: Diagnosis not present

## 2023-03-14 DIAGNOSIS — Z8673 Personal history of transient ischemic attack (TIA), and cerebral infarction without residual deficits: Secondary | ICD-10-CM | POA: Diagnosis not present

## 2023-03-15 DIAGNOSIS — M6281 Muscle weakness (generalized): Secondary | ICD-10-CM | POA: Diagnosis not present

## 2023-03-15 DIAGNOSIS — Z471 Aftercare following joint replacement surgery: Secondary | ICD-10-CM | POA: Diagnosis not present

## 2023-03-15 DIAGNOSIS — Z79899 Other long term (current) drug therapy: Secondary | ICD-10-CM | POA: Diagnosis not present

## 2023-03-16 DIAGNOSIS — Z471 Aftercare following joint replacement surgery: Secondary | ICD-10-CM | POA: Diagnosis not present

## 2023-03-16 DIAGNOSIS — M6281 Muscle weakness (generalized): Secondary | ICD-10-CM | POA: Diagnosis not present

## 2023-03-17 DIAGNOSIS — Z471 Aftercare following joint replacement surgery: Secondary | ICD-10-CM | POA: Diagnosis not present

## 2023-03-17 DIAGNOSIS — M6281 Muscle weakness (generalized): Secondary | ICD-10-CM | POA: Diagnosis not present

## 2023-03-18 DIAGNOSIS — M6281 Muscle weakness (generalized): Secondary | ICD-10-CM | POA: Diagnosis not present

## 2023-03-18 DIAGNOSIS — Z471 Aftercare following joint replacement surgery: Secondary | ICD-10-CM | POA: Diagnosis not present

## 2023-03-19 DIAGNOSIS — D649 Anemia, unspecified: Secondary | ICD-10-CM | POA: Diagnosis not present

## 2023-03-21 DIAGNOSIS — Z471 Aftercare following joint replacement surgery: Secondary | ICD-10-CM | POA: Diagnosis not present

## 2023-03-21 DIAGNOSIS — M6281 Muscle weakness (generalized): Secondary | ICD-10-CM | POA: Diagnosis not present

## 2023-03-22 DIAGNOSIS — M6281 Muscle weakness (generalized): Secondary | ICD-10-CM | POA: Diagnosis not present

## 2023-03-22 DIAGNOSIS — Z471 Aftercare following joint replacement surgery: Secondary | ICD-10-CM | POA: Diagnosis not present

## 2023-04-18 DIAGNOSIS — F5105 Insomnia due to other mental disorder: Secondary | ICD-10-CM | POA: Diagnosis not present

## 2023-04-18 DIAGNOSIS — F331 Major depressive disorder, recurrent, moderate: Secondary | ICD-10-CM | POA: Diagnosis not present

## 2023-04-18 DIAGNOSIS — F411 Generalized anxiety disorder: Secondary | ICD-10-CM | POA: Diagnosis not present

## 2023-04-18 DIAGNOSIS — Z79899 Other long term (current) drug therapy: Secondary | ICD-10-CM | POA: Diagnosis not present

## 2023-04-18 DIAGNOSIS — G8929 Other chronic pain: Secondary | ICD-10-CM | POA: Diagnosis not present

## 2023-04-18 DIAGNOSIS — G3184 Mild cognitive impairment, so stated: Secondary | ICD-10-CM | POA: Diagnosis not present

## 2023-04-26 NOTE — H&P (Signed)
Patient's anticipated LOS is less than 2 midnights, meeting these requirements: - Younger than 80 - Lives within 1 hour of care - Has a competent adult at home to recover with post-op recover - NO history of  - Chronic pain requiring opiods  - Diabetes  - Coronary Artery Disease  - Heart failure  - Heart attack  - Stroke  - DVT/VTE  - Cardiac arrhythmia  - Respiratory Failure/COPD  - Renal failure  - Anemia  - Advanced Liver disease     Roger Morton is an 52 y.o. male.    Chief Complaint: left shoulder pain  HPI: Pt is a 52 y.o. male complaining of left shoulder pain for multiple years. Pain had continually increased since the beginning. X-rays in the clinic show end-stage arthritic changes of the left shoulder. Pt has tried various conservative treatments which have failed to alleviate their symptoms, including injections and therapy. Various options are discussed with the patient. Risks, benefits and expectations were discussed with the patient. Patient understand the risks, benefits and expectations and wishes to proceed with surgery.   PCP:  Heather Roberts, NP  D/C Plans: Home  PMH: Past Medical History:  Diagnosis Date   Anemia    Anxiety    BPH (benign prostatic hyperplasia)    Chronic pain    Depression    Dysphagia    Essential hypertension    Essential tremor    Facial paralysis on left side    GERD (gastroesophageal reflux disease)    Hemiplegia and hemiparesis following cerebral infarction affecting left non-dominant side (HCC)    History of brain tumor    History of COVID-19 08/11/2022   Hyperlipidemia    Insomnia    Left leg weakness    S/P stroke   Neuropathy    OA (osteoarthritis)    OAB (overactive bladder)    Seizure disorder (HCC)    Sinus tachycardia    Stroke (HCC)    had brain tumor, and had CVAs during operation for his brain tumor    PSH: Past Surgical History:  Procedure Laterality Date   BRAIN SURGERY  2013   brain tumor  excision   COLONOSCOPY WITH PROPOFOL N/A 08/11/2021   Procedure: COLONOSCOPY WITH PROPOFOL;  Surgeon: Midge Minium, MD;  Location: [facility] ENDOSCOPY;  Service: Endoscopy;  Laterality: N/A;   INTRAMEDULLARY (IM) NAIL INTERTROCHANTERIC Left 02/15/2021   Procedure: INTRAMEDULLARY (IM) NAIL INTERTROCHANTRIC;  Surgeon: Durene Romans, MD;  Location: [facility] OR;  Service: Orthopedics;  Laterality: Left;   REVERSE SHOULDER ARTHROPLASTY Right 10/29/2022   Procedure: REVERSE SHOULDER ARTHROPLASTY;  Surgeon: Beverely Low, MD;  Location: [facility] ORS;  Service: Orthopedics;  Laterality: Right;   shunt intracranial x2      Social History:  reports that he has been smoking cigarettes. He has a 25 pack-year smoking history. He has never used smokeless tobacco. He reports that he does not currently use alcohol. He reports that he does not currently use drugs. BMI: There is no height or weight on file to calculate BMI.  Lab Results  Component Value Date   ALBUMIN 3.0 (L) 02/20/2021   Diabetes: Patient does not have a diagnosis of diabetes.     Smoking Status: Social History   Tobacco Use  Smoking Status Every Day   Current packs/day: 1.00   Average packs/day: 1 pack/day for 25.0 years (25.0 ttl pk-yrs)   Types: Cigarettes  Smokeless Tobacco Never   Ready to quit: Not Answered Counseling given: Not Answered  The patient has participated in a 4-week cessation program.          Allergies:  Allergies  Allergen Reactions   Penicillins     Per MAR     Medications: No current facility-administered medications for this encounter.   Current Outpatient Medications  Medication Sig Dispense Refill   aspirin 81 MG chewable tablet Chew 81 mg by mouth daily.     atorvastatin (LIPITOR) 40 MG tablet Take 40 mg by mouth daily.     azelastine (ASTELIN) 0.1 % nasal spray Place 2 sprays into both nostrils 2 (two) times daily. Use in each nostril as directed     bacitracin 500 UNIT/GM ointment Apply 1 Application  topically See admin instructions. Apply to right shin DAILY for wound care, and as needed     Baclofen 5 MG TABS Take 15 mg by mouth in the morning, at noon, and at bedtime.     busPIRone (BUSPAR) 10 MG tablet Take 10 mg by mouth 3 (three) times daily.     carboxymethylcellulose (ARTIFICIAL TEARS) 1 % ophthalmic solution Place 1 drop into both eyes in the morning, at noon, in the evening, and at bedtime.     celecoxib (CELEBREX) 200 MG capsule Take 1 capsule (200 mg total) by mouth daily. 30 capsule 3   fluticasone (FLONASE) 50 MCG/ACT nasal spray Place 1 spray into both nostrils daily.     gabapentin (NEURONTIN) 100 MG capsule Take 1 capsule (100 mg total) by mouth at bedtime. (Patient taking differently: Take 300-600 mg by mouth See admin instructions. Take 300 mg every morning, 600 mg at bedtime) 30 capsule 3   Lidocaine 4 % PTCH Apply 1 patch topically daily. Apply to left hip in the morning after shower, remove per schedule     melatonin 5 MG TABS Take 5 mg by mouth at bedtime.     mirabegron ER (MYRBETRIQ) 25 MG TB24 tablet Take 1 tablet (25 mg total) by mouth daily. 28 tablet 0   nortriptyline (PAMELOR) 10 MG capsule Take 50 mg by mouth at bedtime.     Omega-3 Fatty Acids (FISH OIL) 1000 MG CAPS Take 1,000 mg by mouth daily.     oxycodone (OXY-IR) 5 MG capsule Take 1 capsule (5 mg total) by mouth every 6 (six) hours as needed for pain. 30 capsule 0   pantoprazole (PROTONIX) 40 MG tablet Take 40 mg by mouth daily.     Polyethyl Glycol-Propyl Glycol (SYSTANE) 0.4-0.3 % SOLN Place 1 drop into both eyes at bedtime. There is a 2nd order in [facility] stating "Instill 1 drop into left eye at bedtime for exposure keratitis"     propranolol (INDERAL) 10 MG tablet Take 10 mg by mouth 2 (two) times daily.     QUEtiapine (SEROQUEL) 200 MG tablet Take 200 mg by mouth at bedtime.     senna-docusate (SENOKOT-S) 8.6-50 MG tablet Take 2 tablets by mouth at bedtime. (Patient taking differently: Take 3 tablets by  mouth daily.) 60 tablet 0   sertraline (ZOLOFT) 50 MG tablet Take 25 mg by mouth daily.     tamsulosin (FLOMAX) 0.4 MG CAPS capsule Take 0.4 mg by mouth daily.      No results found for this or any previous visit (from the past 48 hour(s)). No results found.  ROS: Pain with rom of the left upper extremity  Physical Exam: Alert and oriented 52 y.o. male in no acute distress Cranial nerves 2-12 intact Cervical spine: full rom with no  tenderness, nv intact distally Chest: active breath sounds bilaterally, no wheeze rhonchi or rales Heart: regular rate and rhythm, no murmur Abd: non tender non distended with active bowel sounds Hip is stable with rom  Left shoulder painful and weak rom Nv intact distally No rashes or edema distally  Assessment/Plan Assessment: left shoulder cuff arthropathy  Plan:  Patient will undergo a left reverse total shoulder by Dr. Ranell Patrick at [facility] Risks benefits and expectations were discussed with the patient. Patient understand risks, benefits and expectations and wishes to proceed. Preoperative templating of the joint replacement has been completed, documented, and submitted to the Operating Room personnel in order to optimize intra-operative equipment management.   Roger Overall PA-C, MPAS [facility] is now [employer] [address], [address], [place], [place] [address] Phone: [phone] [email or web address] Facebook  [employer]

## 2023-04-28 NOTE — Progress Notes (Addendum)
For Anesthesia: PCP - Dr. Geanie Kenning Heather Roberts, NP  Cardiologist - N/A  Chest x-ray - greater than 1 year EKG - 10/29/22 in [facility] Stress Test - N/A ECHO - 02/19/21 in [facility] Cardiac Cath - N/A Pacemaker/ICD device last checked:N/A Pacemaker orders received:N/A Device Rep notified:N/A  Spinal Cord Stimulator:N/A  Sleep Study - N/A CPAP - N/A  Fasting Blood Sugar - N/A Checks Blood Sugar __N/A___ times a day Date and result of last Hgb A1c-N/A  Last dose of GLP1 agonist- N/A GLP1 instructions:  N/A  Last dose of SGLT-2 inhibitors-  N/A SGLT-2 instructions: N/A  Blood Thinner Instructions:  Aspirin Instructions: ASA 81 mg Last Dose:  Activity level: pt bedridden     Anesthesia review: History of Stroke, HTN, hisotry of brain tumor, seizures  Patient denies shortness of breath, fever, cough and chest pain during pre op phone call.   Patient verbalized understanding of instructions reviewed via telephone.

## 2023-04-28 NOTE — Patient Instructions (Addendum)
Preop instructions for: Roger Morton. Roger Morton     Date of Birth: 08-23-1971                       Date of Procedure: Friday, Sept 20, 2024 Procedure:  LEFT REVERSE SHOULDER ARTHROPLASTY  Surgeon:  Dr. Malon Kindle Facility contact: [facility]   Phone:    [phone]             Health Care POA: RN contact name/phone#:  Flossie Buffy                        and Fax #: [phone]   Transportation contact phone#: [facility]  Please send day of procedure:  Current med list  Medications taken the day of procedure (return attached form to hospital) confirm time of nothing by mouth status (return attached form to hospital) Patient Demographic info( to include DNR status, problem list, allergies) Bring Insurance card and picture ID    [facility] to [facility] at [facility]: 7:15 AM   Report to: Admitting (On your left hand side)   Stop smoking prior to surgery (absolutely no smoking after midnight before surgery date)   Do not eat solid food past midnight the night before your procedure.(To include any tube feedings-must be discontinued)  May have the following until   7:15  AM day of procedure  CLEAR LIQUID DIET  Water Black Coffee (sugar ok, NO MILK/CREAM OR CREAMERS)  Tea (sugar ok, NO MILK/CREAM OR CREAMERS) regular and decaf                             Plain Jell-O (NO RED)                                           Fruit ices (not with fruit pulp, NO RED)                                     Popsicles (NO RED)                                                                  Juice: apple, WHITE grape, WHITE cranberry Sports drinks like Gatorade (NO RED)   Take these morning medications only with sips of water.(or give through gastrostomy or feeding tube).  Atorvastatin Baclofen Buspirone  Clonazepam Gabapentin Mirabegron Nortriptyline Omeprazole Propranolol Tamsulosin May use eyedrops and nasal spray per normal routine     Stop all  vitamins and herbal supplements 7 days before surgery.   Note: No Insulin or Diabetic meds should be given or taken the morning of the procedure!  Oral Hygiene is also important to reduce your risk of infection.                                    Remember - BRUSH YOUR TEETH THE MORNING OF SURGERY WITH YOUR REGULAR TOOTHPASTE  DENTURES WILL BE REMOVED PRIOR TO SURGERY PLEASE DO NOT APPLY "Poly grip" OR ADHESIVES!!!  Leave all jewelry and other valuables at place where living( no metal or rings to be worn) No contact lens  Men-no colognes,lotions   Any questions day of procedure,call  SHORT STAY-[phone]     Sent from :[facility]                   Phone:[phone]                   Fax:[phone]   Sent by :  Albertus Chiarelli BSN, RN

## 2023-05-13 ENCOUNTER — Ambulatory Visit (HOSPITAL_COMMUNITY): Payer: 59 | Admitting: Physician Assistant

## 2023-05-13 ENCOUNTER — Other Ambulatory Visit: Payer: Self-pay

## 2023-05-13 ENCOUNTER — Encounter (HOSPITAL_COMMUNITY): Payer: Self-pay | Admitting: Orthopedic Surgery

## 2023-05-13 ENCOUNTER — Encounter (HOSPITAL_COMMUNITY)
Admission: RE | Disposition: A | Discharge status: Home / Self Care | Payer: Self-pay | Source: Home / Self Care | Attending: Orthopedic Surgery

## 2023-05-13 ENCOUNTER — Observation Stay (HOSPITAL_COMMUNITY): Payer: 59

## 2023-05-13 ENCOUNTER — Observation Stay (HOSPITAL_COMMUNITY)
Admission: RE | Admit: 2023-05-13 | Discharge: 2023-05-14 | Disposition: A | Discharge status: Home / Self Care | Payer: 59 | Attending: Orthopedic Surgery | Admitting: Orthopedic Surgery

## 2023-05-13 DIAGNOSIS — F17210 Nicotine dependence, cigarettes, uncomplicated: Secondary | ICD-10-CM | POA: Insufficient documentation

## 2023-05-13 DIAGNOSIS — I10 Essential (primary) hypertension: Secondary | ICD-10-CM | POA: Insufficient documentation

## 2023-05-13 DIAGNOSIS — Z96612 Presence of left artificial shoulder joint: Secondary | ICD-10-CM

## 2023-05-13 DIAGNOSIS — Z7982 Long term (current) use of aspirin: Secondary | ICD-10-CM | POA: Insufficient documentation

## 2023-05-13 DIAGNOSIS — M19012 Primary osteoarthritis, left shoulder: Secondary | ICD-10-CM

## 2023-05-13 DIAGNOSIS — M87812 Other osteonecrosis, left shoulder: Secondary | ICD-10-CM | POA: Diagnosis not present

## 2023-05-13 DIAGNOSIS — E785 Hyperlipidemia, unspecified: Secondary | ICD-10-CM | POA: Diagnosis not present

## 2023-05-13 DIAGNOSIS — Z8673 Personal history of transient ischemic attack (TIA), and cerebral infarction without residual deficits: Secondary | ICD-10-CM | POA: Diagnosis not present

## 2023-05-13 DIAGNOSIS — Z96611 Presence of right artificial shoulder joint: Secondary | ICD-10-CM | POA: Insufficient documentation

## 2023-05-13 DIAGNOSIS — Z79899 Other long term (current) drug therapy: Secondary | ICD-10-CM | POA: Insufficient documentation

## 2023-05-13 DIAGNOSIS — Z01818 Encounter for other preprocedural examination: Principal | ICD-10-CM

## 2023-05-13 DIAGNOSIS — Z8616 Personal history of COVID-19: Secondary | ICD-10-CM | POA: Diagnosis not present

## 2023-05-13 DIAGNOSIS — M12812 Other specific arthropathies, not elsewhere classified, left shoulder: Secondary | ICD-10-CM | POA: Diagnosis not present

## 2023-05-13 HISTORY — PX: REVERSE SHOULDER ARTHROPLASTY: SHX5054

## 2023-05-13 LAB — CBC
HCT: 46.7 % (ref 39.0–52.0)
Hemoglobin: 15.7 g/dL (ref 13.0–17.0)
MCH: 31 pg (ref 26.0–34.0)
MCHC: 33.6 g/dL (ref 30.0–36.0)
MCV: 92.3 fL (ref 80.0–100.0)
Platelets: 243 K/uL (ref 150–400)
RBC: 5.06 MIL/uL (ref 4.22–5.81)
RDW: 13.2 % (ref 11.5–15.5)
WBC: 9.9 K/uL (ref 4.0–10.5)
nRBC: 0 % (ref 0.0–0.2)

## 2023-05-13 LAB — BASIC METABOLIC PANEL
Anion gap: 8 (ref 5–15)
BUN: 18 mg/dL (ref 6–20)
CO2: 24 mmol/L (ref 22–32)
Calcium: 9.3 mg/dL (ref 8.9–10.3)
Chloride: 105 mmol/L (ref 98–111)
Creatinine, Ser: 0.54 mg/dL — ABNORMAL LOW (ref 0.61–1.24)
GFR, Estimated: >60 mL/min (ref >60)
Glucose, Bld: 97 mg/dL (ref 70–99)
Potassium: 3.8 mmol/L (ref 3.5–5.1)
Sodium: 137 mmol/L (ref 135–145)

## 2023-05-13 LAB — SURGICAL PCR SCREEN
MRSA, PCR: NEGATIVE
Staphylococcus aureus: NEGATIVE

## 2023-05-13 SURGERY — ARTHROPLASTY, SHOULDER, TOTAL, REVERSE
Anesthesia: Regional | Site: Shoulder | Laterality: Left

## 2023-05-13 MED ORDER — ROCURONIUM BROMIDE 10 MG/ML (PF) SYRINGE
PREFILLED_SYRINGE | INTRAVENOUS | Status: DC | PRN
  Administered 2023-05-13: 50 mg via INTRAVENOUS

## 2023-05-13 MED ORDER — PANTOPRAZOLE SODIUM 20 MG PO TBEC
20.0000 mg | DELAYED_RELEASE_TABLET | Freq: Every day | ORAL | Status: DC
  Administered 2023-05-14: 20 mg via ORAL
  Filled 2023-05-13: qty 1

## 2023-05-13 MED ORDER — PROPRANOLOL HCL 20 MG PO TABS
10.0000 mg | ORAL_TABLET | Freq: Two times a day (BID) | ORAL | Status: DC
  Administered 2023-05-13 – 2023-05-14 (×2): 10 mg via ORAL
  Filled 2023-05-13 (×2): qty 1

## 2023-05-13 MED ORDER — CARBOXYMETHYLCELLUL-GLYCERIN 0.5-0.9 % OP SOLN
1.0000 [drp] | Freq: Three times a day (TID) | OPHTHALMIC | Status: DC
  Administered 2023-05-13 – 2023-05-14 (×2): 1 [drp] via OPHTHALMIC
  Filled 2023-05-13: qty 15

## 2023-05-13 MED ORDER — CEFAZOLIN SODIUM-DEXTROSE 2-4 GM/100ML-% IV SOLN
2.0000 g | INTRAVENOUS | Status: AC
  Administered 2023-05-13: 2 g via INTRAVENOUS
  Filled 2023-05-13: qty 100

## 2023-05-13 MED ORDER — BUPIVACAINE-EPINEPHRINE 0.25% -1:200000 IJ SOLN
INTRAMUSCULAR | Status: AC
  Filled 2023-05-13: qty 1

## 2023-05-13 MED ORDER — SODIUM CHLORIDE 0.9 % IV SOLN: INTRAVENOUS | Status: DC

## 2023-05-13 MED ORDER — TRANEXAMIC ACID-NACL 1000-0.7 MG/100ML-% IV SOLN
1000.0000 mg | Freq: Once | INTRAVENOUS | Status: AC
  Administered 2023-05-13: 1000 mg via INTRAVENOUS
  Filled 2023-05-13: qty 100

## 2023-05-13 MED ORDER — FLUTICASONE PROPIONATE 50 MCG/ACT NA SUSP
1.0000 | Freq: Every day | NASAL | Status: DC
  Administered 2023-05-14: 1 via NASAL
  Filled 2023-05-13: qty 16

## 2023-05-13 MED ORDER — ONDANSETRON HCL 4 MG/2ML IJ SOLN
INTRAMUSCULAR | Status: DC | PRN
  Administered 2023-05-13: 4 mg via INTRAVENOUS

## 2023-05-13 MED ORDER — 0.9 % SODIUM CHLORIDE (POUR BTL) OPTIME
TOPICAL | Status: DC | PRN
  Administered 2023-05-13: 1000 mL

## 2023-05-13 MED ORDER — QUETIAPINE FUMARATE 50 MG PO TABS
200.0000 mg | ORAL_TABLET | Freq: Every day | ORAL | Status: DC
  Administered 2023-05-13: 200 mg via ORAL
  Filled 2023-05-13: qty 4

## 2023-05-13 MED ORDER — ONDANSETRON HCL 4 MG/2ML IJ SOLN: 4.0000 mg | Freq: Four times a day (QID) | INTRAMUSCULAR | Status: DC | PRN

## 2023-05-13 MED ORDER — HYDROMORPHONE HCL 1 MG/ML IJ SOLN: 0.5000 mg | INTRAMUSCULAR | Status: DC | PRN

## 2023-05-13 MED ORDER — POLYETHYLENE GLYCOL 3350 17 G PO PACK: 17.0000 g | PACK | Freq: Every day | ORAL | Status: DC | PRN

## 2023-05-13 MED ORDER — GABAPENTIN 300 MG PO CAPS
300.0000 mg | ORAL_CAPSULE | Freq: Every day | ORAL | Status: DC
  Administered 2023-05-14: 300 mg via ORAL
  Filled 2023-05-13: qty 1

## 2023-05-13 MED ORDER — BUPIVACAINE-EPINEPHRINE (PF) 0.25% -1:200000 IJ SOLN
INTRAMUSCULAR | Status: DC | PRN
  Administered 2023-05-13: 19 mL via PERINEURAL

## 2023-05-13 MED ORDER — METHOCARBAMOL 500 MG PO TABS
500.0000 mg | ORAL_TABLET | Freq: Four times a day (QID) | ORAL | Status: DC | PRN
  Administered 2023-05-13 – 2023-05-14 (×2): 500 mg via ORAL
  Filled 2023-05-13 (×2): qty 1

## 2023-05-13 MED ORDER — ACETAMINOPHEN 325 MG PO TABS
650.0000 mg | ORAL_TABLET | Freq: Two times a day (BID) | ORAL | Status: DC
  Administered 2023-05-13 – 2023-05-14 (×2): 650 mg via ORAL
  Filled 2023-05-13 (×2): qty 2

## 2023-05-13 MED ORDER — CLONAZEPAM 0.125 MG PO TBDP
0.2500 mg | ORAL_TABLET | Freq: Two times a day (BID) | ORAL | Status: DC
  Administered 2023-05-13 – 2023-05-14 (×2): 0.25 mg via ORAL
  Filled 2023-05-13 (×2): qty 2

## 2023-05-13 MED ORDER — PHENOL 1.4 % MT LIQD: 1.0000 | OROMUCOSAL | Status: DC | PRN

## 2023-05-13 MED ORDER — NORTRIPTYLINE HCL 25 MG PO CAPS
50.0000 mg | ORAL_CAPSULE | Freq: Every day | ORAL | Status: DC
  Administered 2023-05-13: 50 mg via ORAL
  Filled 2023-05-13 (×2): qty 2

## 2023-05-13 MED ORDER — MIRABEGRON ER 25 MG PO TB24
25.0000 mg | ORAL_TABLET | Freq: Every day | ORAL | Status: DC
  Administered 2023-05-14: 25 mg via ORAL
  Filled 2023-05-13: qty 1

## 2023-05-13 MED ORDER — METOCLOPRAMIDE HCL 5 MG/ML IJ SOLN: 5.0000 mg | Freq: Three times a day (TID) | INTRAMUSCULAR | Status: DC | PRN

## 2023-05-13 MED ORDER — OMEGA-3-ACID ETHYL ESTERS 1 G PO CAPS
1.0000 g | ORAL_CAPSULE | Freq: Every day | ORAL | Status: DC
  Administered 2023-05-14: 1 g via ORAL
  Filled 2023-05-13: qty 1

## 2023-05-13 MED ORDER — SENNOSIDES-DOCUSATE SODIUM 8.6-50 MG PO TABS
2.0000 | ORAL_TABLET | Freq: Every day | ORAL | Status: DC
  Administered 2023-05-13: 2 via ORAL
  Filled 2023-05-13: qty 2

## 2023-05-13 MED ORDER — BUPIVACAINE HCL (PF) 0.5 % IJ SOLN
INTRAMUSCULAR | Status: DC | PRN
Start: 2023-05-13 — End: 2023-05-13
  Administered 2023-05-13: 10 mL

## 2023-05-13 MED ORDER — LIDOCAINE 2% (20 MG/ML) 5 ML SYRINGE
INTRAMUSCULAR | Status: DC | PRN
  Administered 2023-05-13: 40 mg via INTRAVENOUS

## 2023-05-13 MED ORDER — ORAL CARE MOUTH RINSE: 15.0000 mL | Freq: Once | OROMUCOSAL | Status: AC

## 2023-05-13 MED ORDER — METOCLOPRAMIDE HCL 5 MG PO TABS: 5.0000 mg | ORAL_TABLET | Freq: Three times a day (TID) | ORAL | Status: DC | PRN

## 2023-05-13 MED ORDER — DROPERIDOL 2.5 MG/ML IJ SOLN
INTRAMUSCULAR | Status: AC
  Administered 2023-05-13: 0.625 mg via INTRAVENOUS
  Filled 2023-05-13: qty 2

## 2023-05-13 MED ORDER — LACTATED RINGERS IV SOLN: INTRAVENOUS | Status: DC

## 2023-05-13 MED ORDER — OXYCODONE HCL 5 MG PO CAPS: 5.0000 mg | ORAL_CAPSULE | Freq: Four times a day (QID) | ORAL | 0 refills | Status: DC | PRN

## 2023-05-13 MED ORDER — CEFAZOLIN SODIUM-DEXTROSE 2-4 GM/100ML-% IV SOLN
2.0000 g | Freq: Four times a day (QID) | INTRAVENOUS | Status: AC
  Administered 2023-05-13 – 2023-05-14 (×3): 2 g via INTRAVENOUS
  Filled 2023-05-13 (×3): qty 100

## 2023-05-13 MED ORDER — AZELASTINE HCL 0.1 % NA SOLN
2.0000 | Freq: Two times a day (BID) | NASAL | Status: DC
  Administered 2023-05-13 – 2023-05-14 (×2): 2 via NASAL
  Filled 2023-05-13: qty 30

## 2023-05-13 MED ORDER — ACETAMINOPHEN 325 MG PO TABS
325.0000 mg | ORAL_TABLET | Freq: Four times a day (QID) | ORAL | Status: DC | PRN
  Administered 2023-05-14: 650 mg via ORAL
  Filled 2023-05-13: qty 2

## 2023-05-13 MED ORDER — MELATONIN 5 MG PO TABS
5.0000 mg | ORAL_TABLET | Freq: Every day | ORAL | Status: DC
  Administered 2023-05-13: 5 mg via ORAL
  Filled 2023-05-13: qty 1

## 2023-05-13 MED ORDER — ACETAMINOPHEN 10 MG/ML IV SOLN: 1000.0000 mg | Freq: Once | INTRAVENOUS | Status: DC | PRN

## 2023-05-13 MED ORDER — BACLOFEN 20 MG PO TABS
20.0000 mg | ORAL_TABLET | Freq: Three times a day (TID) | ORAL | Status: DC
  Administered 2023-05-13 – 2023-05-14 (×2): 20 mg via ORAL
  Filled 2023-05-13 (×2): qty 1

## 2023-05-13 MED ORDER — FENTANYL CITRATE (PF) 100 MCG/2ML IJ SOLN
INTRAMUSCULAR | Status: AC
  Filled 2023-05-13: qty 2

## 2023-05-13 MED ORDER — METHOCARBAMOL 1000 MG/10ML IJ SOLN: 500.0000 mg | Freq: Four times a day (QID) | INTRAVENOUS | Status: DC | PRN

## 2023-05-13 MED ORDER — FENTANYL CITRATE PF 50 MCG/ML IJ SOSY: 25.0000 ug | PREFILLED_SYRINGE | INTRAMUSCULAR | Status: DC | PRN

## 2023-05-13 MED ORDER — BISACODYL 10 MG RE SUPP: 10.0000 mg | Freq: Every day | RECTAL | Status: DC | PRN

## 2023-05-13 MED ORDER — PROPOFOL 10 MG/ML IV BOLUS
INTRAVENOUS | Status: DC | PRN
  Administered 2023-05-13: 170 mg via INTRAVENOUS

## 2023-05-13 MED ORDER — CHLORHEXIDINE GLUCONATE 0.12 % MT SOLN
15.0000 mL | Freq: Once | OROMUCOSAL | Status: AC
  Administered 2023-05-13: 15 mL via OROMUCOSAL

## 2023-05-13 MED ORDER — DEXAMETHASONE SODIUM PHOSPHATE 10 MG/ML IJ SOLN
INTRAMUSCULAR | Status: DC | PRN
  Administered 2023-05-13: 4 mg via INTRAVENOUS

## 2023-05-13 MED ORDER — MENTHOL 3 MG MT LOZG: 1.0000 | LOZENGE | OROMUCOSAL | Status: DC | PRN

## 2023-05-13 MED ORDER — ONDANSETRON HCL 4 MG PO TABS: 4.0000 mg | ORAL_TABLET | Freq: Four times a day (QID) | ORAL | Status: DC | PRN

## 2023-05-13 MED ORDER — FENTANYL CITRATE (PF) 100 MCG/2ML IJ SOLN
INTRAMUSCULAR | Status: DC | PRN
  Administered 2023-05-13: 50 ug via INTRAVENOUS
  Administered 2023-05-13 (×2): 25 ug via INTRAVENOUS

## 2023-05-13 MED ORDER — DOCUSATE SODIUM 100 MG PO CAPS
100.0000 mg | ORAL_CAPSULE | Freq: Two times a day (BID) | ORAL | Status: DC
  Administered 2023-05-13 – 2023-05-14 (×2): 100 mg via ORAL
  Filled 2023-05-13 (×2): qty 1

## 2023-05-13 MED ORDER — GABAPENTIN 300 MG PO CAPS
900.0000 mg | ORAL_CAPSULE | Freq: Every day | ORAL | Status: DC
  Administered 2023-05-13: 900 mg via ORAL
  Filled 2023-05-13: qty 3

## 2023-05-13 MED ORDER — ASPIRIN 81 MG PO CHEW
81.0000 mg | CHEWABLE_TABLET | Freq: Every day | ORAL | Status: DC
  Administered 2023-05-13 – 2023-05-14 (×2): 81 mg via ORAL
  Filled 2023-05-13 (×2): qty 1

## 2023-05-13 MED ORDER — MIDAZOLAM HCL 2 MG/2ML IJ SOLN
2.0000 mg | Freq: Once | INTRAMUSCULAR | Status: AC
  Administered 2023-05-13: 1 mg via INTRAVENOUS
  Filled 2023-05-13: qty 2

## 2023-05-13 MED ORDER — BUPIVACAINE LIPOSOME 1.3 % IJ SUSP
INTRAMUSCULAR | Status: DC | PRN
  Administered 2023-05-13: 10 mL

## 2023-05-13 MED ORDER — POLYETHYL GLYCOL-PROPYL GLYCOL 0.4-0.3 % OP SOLN: 1.0000 [drp] | Freq: Every day | OPHTHALMIC | Status: DC

## 2023-05-13 MED ORDER — FENTANYL CITRATE PF 50 MCG/ML IJ SOSY
100.0000 ug | PREFILLED_SYRINGE | Freq: Once | INTRAMUSCULAR | Status: AC
  Administered 2023-05-13: 50 ug via INTRAVENOUS
  Filled 2023-05-13: qty 2

## 2023-05-13 MED ORDER — NORTRIPTYLINE HCL 10 MG PO CAPS
10.0000 mg | ORAL_CAPSULE | Freq: Every day | ORAL | Status: DC
  Administered 2023-05-14: 10 mg via ORAL
  Filled 2023-05-13 (×2): qty 1

## 2023-05-13 MED ORDER — BUSPIRONE HCL 10 MG PO TABS
10.0000 mg | ORAL_TABLET | Freq: Three times a day (TID) | ORAL | Status: DC
  Administered 2023-05-13 – 2023-05-14 (×2): 10 mg via ORAL
  Filled 2023-05-13 (×2): qty 1

## 2023-05-13 MED ORDER — ATORVASTATIN CALCIUM 40 MG PO TABS: 40.0000 mg | ORAL_TABLET | Freq: Every day | ORAL | Status: DC

## 2023-05-13 MED ORDER — CARBOXYMETHYLCELLULOSE SODIUM 1 % OP SOLN: 1.0000 [drp] | Freq: Every day | OPHTHALMIC | Status: DC

## 2023-05-13 MED ORDER — OXYCODONE HCL 5 MG/5ML PO SOLN: 5.0000 mg | Freq: Once | ORAL | Status: DC | PRN

## 2023-05-13 MED ORDER — DICLOFENAC SODIUM 1 % EX GEL
2.0000 g | Freq: Three times a day (TID) | CUTANEOUS | Status: DC
  Administered 2023-05-13 – 2023-05-14 (×2): 2 g via TOPICAL
  Filled 2023-05-13: qty 100

## 2023-05-13 MED ORDER — OXYCODONE HCL 5 MG PO TABS
5.0000 mg | ORAL_TABLET | ORAL | Status: DC | PRN
  Administered 2023-05-13: 5 mg via ORAL
  Filled 2023-05-13: qty 1

## 2023-05-13 MED ORDER — CELECOXIB 200 MG PO CAPS
200.0000 mg | ORAL_CAPSULE | Freq: Every day | ORAL | Status: DC
  Administered 2023-05-14: 200 mg via ORAL
  Filled 2023-05-13: qty 1

## 2023-05-13 MED ORDER — TAMSULOSIN HCL 0.4 MG PO CAPS
0.4000 mg | ORAL_CAPSULE | Freq: Every day | ORAL | Status: DC
  Administered 2023-05-14: 0.4 mg via ORAL
  Filled 2023-05-13: qty 1

## 2023-05-13 MED ORDER — DROPERIDOL 2.5 MG/ML IJ SOLN: 0.6250 mg | Freq: Once | INTRAMUSCULAR | Status: AC | PRN

## 2023-05-13 MED ORDER — TRANEXAMIC ACID-NACL 1000-0.7 MG/100ML-% IV SOLN
1000.0000 mg | INTRAVENOUS | Status: AC
  Administered 2023-05-13: 1000 mg via INTRAVENOUS
  Filled 2023-05-13: qty 100

## 2023-05-13 MED ORDER — STERILE WATER FOR IRRIGATION IR SOLN
Status: DC | PRN
  Administered 2023-05-13: 2000 mL

## 2023-05-13 MED ORDER — PHENYLEPHRINE HCL-NACL 20-0.9 MG/250ML-% IV SOLN
INTRAVENOUS | Status: DC | PRN
Start: 2023-05-13 — End: 2023-05-13
  Administered 2023-05-13: 30 ug/min via INTRAVENOUS

## 2023-05-13 MED ORDER — NICOTINE 7 MG/24HR TD PT24
7.0000 mg | MEDICATED_PATCH | Freq: Every day | TRANSDERMAL | Status: DC
  Administered 2023-05-13 – 2023-05-14 (×2): 7 mg via TRANSDERMAL
  Filled 2023-05-13 (×2): qty 1

## 2023-05-13 MED ORDER — OXYCODONE HCL 5 MG PO TABS: 5.0000 mg | ORAL_TABLET | Freq: Once | ORAL | Status: DC | PRN

## 2023-05-13 MED ORDER — SUGAMMADEX SODIUM 200 MG/2ML IV SOLN
INTRAVENOUS | Status: DC | PRN
  Administered 2023-05-13: 200 mg via INTRAVENOUS

## 2023-05-13 SURGICAL SUPPLY — 69 items
AID PSTN UNV HD RSTRNT DISP (MISCELLANEOUS) ×1
BAG COUNTER SPONGE SURGICOUNT (BAG) IMPLANT
BAG SPEC THK2 15X12 ZIP CLS (MISCELLANEOUS)
BAG SPNG CNTER NS LX DISP (BAG)
BAG ZIPLOCK 12X15 (MISCELLANEOUS) IMPLANT
BIT DRILL 1.6MX128 (BIT) IMPLANT
BIT DRILL 170X2.5X (BIT) IMPLANT
BIT DRL 170X2.5X (BIT) ×1 IMPLANT
BLADE SAG 18X100X1.27 (BLADE) ×1 IMPLANT
COVER BACK TABLE 60X90IN (DRAPES) ×1 IMPLANT
COVER SURGICAL LIGHT HANDLE (MISCELLANEOUS) ×1 IMPLANT
CUP HUMERAL 42 PLUS 3 (Orthopedic Implant) IMPLANT
DRAPE INCISE IOBAN 66X45 STRL (DRAPES) ×1 IMPLANT
DRAPE ORTHO SPLIT 77X108 STRL (DRAPES) ×2
DRAPE SHEET LG 3/4 BI-LAMINATE (DRAPES) ×1 IMPLANT
DRAPE SURG ORHT 6 SPLT 77X108 (DRAPES) ×2 IMPLANT
DRAPE TOP 10253 STERILE (DRAPES) ×1 IMPLANT
DRAPE U-SHAPE 47X51 STRL (DRAPES) ×1 IMPLANT
DRILL 2.5 (BIT) ×1
DRSG ADAPTIC 3X8 NADH LF (GAUZE/BANDAGES/DRESSINGS) ×1 IMPLANT
DURAPREP 26ML APPLICATOR (WOUND CARE) ×1 IMPLANT
ELECT BLADE TIP CTD 4 INCH (ELECTRODE) ×1 IMPLANT
ELECT NDL TIP 2.8 STRL (NEEDLE) ×1 IMPLANT
ELECT NEEDLE TIP 2.8 STRL (NEEDLE) ×1 IMPLANT
ELECT REM PT RETURN 15FT ADLT (MISCELLANEOUS) ×1 IMPLANT
EPIPHSYSI CENTER SZ 2 LT (Shoulder) ×1 IMPLANT
EPIPHYSIS CENTER SZ 2 LT (Shoulder) IMPLANT
FACESHIELD WRAPAROUND (MASK) ×1 IMPLANT
FACESHIELD WRAPAROUND OR TEAM (MASK) ×1 IMPLANT
GAUZE PAD ABD 8X10 STRL (GAUZE/BANDAGES/DRESSINGS) ×1 IMPLANT
GAUZE SPONGE 4X4 12PLY STRL (GAUZE/BANDAGES/DRESSINGS) ×1 IMPLANT
GLENOSPHERE XTEND LAT 42+0 STD (Miscellaneous) IMPLANT
GLOVE BIOGEL PI IND STRL 7.5 (GLOVE) ×1 IMPLANT
GLOVE BIOGEL PI IND STRL 8.5 (GLOVE) ×1 IMPLANT
GLOVE ORTHO TXT STRL SZ7.5 (GLOVE) ×1 IMPLANT
GLOVE SURG ORTHO 8.5 STRL (GLOVE) ×1 IMPLANT
GOWN STRL REUS W/ TWL XL LVL3 (GOWN DISPOSABLE) ×2 IMPLANT
GOWN STRL REUS W/TWL XL LVL3 (GOWN DISPOSABLE) ×2
KIT BASIN OR (CUSTOM PROCEDURE TRAY) ×1 IMPLANT
KIT TURNOVER KIT A (KITS) IMPLANT
MANIFOLD NEPTUNE II (INSTRUMENTS) ×1 IMPLANT
METAGLENE DELTA EXTEND (Trauma) IMPLANT
METAGLENE DXTEND (Trauma) ×1 IMPLANT
NDL MAYO CATGUT SZ4 TPR NDL (NEEDLE) IMPLANT
NEEDLE MAYO CATGUT SZ4 (NEEDLE) IMPLANT
NS IRRIG 1000ML POUR BTL (IV SOLUTION) ×1 IMPLANT
PACK SHOULDER (CUSTOM PROCEDURE TRAY) ×1 IMPLANT
PIN GUIDE 1.2 (PIN) IMPLANT
PIN GUIDE GLENOPHERE 1.5MX300M (PIN) IMPLANT
PIN METAGLENE 2.5 (PIN) IMPLANT
RESTRAINT HEAD UNIVERSAL NS (MISCELLANEOUS) ×1 IMPLANT
SCREW 4.5X18MM (Screw) ×1 IMPLANT
SCREW 4.5X36MM (Screw) IMPLANT
SCREW BN 18X4.5XSTRL SHLDR (Screw) IMPLANT
SCREW LOCK DELTA XTEND 4.5X30 (Screw) IMPLANT
SLING ARM FOAM STRAP LRG (SOFTGOODS) IMPLANT
SPIKE FLUID TRANSFER (MISCELLANEOUS) ×1 IMPLANT
SPONGE T-LAP 4X18 ~~LOC~~+RFID (SPONGE) IMPLANT
STEM 12 HA (Stem) IMPLANT
STRIP CLOSURE SKIN 1/2X4 (GAUZE/BANDAGES/DRESSINGS) ×1 IMPLANT
SUT FIBERWIRE #2 38 T-5 BLUE (SUTURE) ×1 IMPLANT
SUT MNCRL AB 4-0 PS2 18 (SUTURE) ×1 IMPLANT
SUT VIC AB 0 CT1 36 (SUTURE) ×1 IMPLANT
SUT VIC AB 0 CT2 27 (SUTURE) ×1 IMPLANT
SUT VIC AB 2-0 CT1 27 (SUTURE) ×1
SUT VIC AB 2-0 CT1 TAPERPNT 27 (SUTURE) ×1 IMPLANT
SUTURE FIBERWR #2 38 T-5 BLUE (SUTURE) ×1 IMPLANT
TAPE CLOTH SURG 4X10 WHT LF (GAUZE/BANDAGES/DRESSINGS) IMPLANT
TOWEL OR 17X26 10 PK STRL BLUE (TOWEL DISPOSABLE) ×1 IMPLANT

## 2023-05-13 NOTE — Anesthesia Postprocedure Evaluation (Addendum)
Anesthesia Post Note  Patient: Roger Morton  Procedure(s) Performed: REVERSE SHOULDER ARTHROPLASTY (Left: Shoulder)     Patient location during evaluation: PACU Anesthesia Type: Regional and General Level of consciousness: awake and alert Pain management: pain level controlled Vital Signs Assessment: post-procedure vital signs reviewed and stable Respiratory status: spontaneous breathing, nonlabored ventilation, respiratory function stable and patient connected to nasal cannula oxygen Cardiovascular status: blood pressure returned to baseline and stable Postop Assessment: no apparent nausea or vomiting Anesthetic complications: no   No notable events documented.  Last Vitals:  Vitals:   05/13/23 1630 05/13/23 1648  BP: (!) 134/99 (!) 138/99  Pulse: 96 94  Resp: 19 17  Temp:    SpO2: 95% 91%    Last Pain:  Vitals:   05/13/23 1630  TempSrc:   PainSc: 0-No pain                 [place]

## 2023-05-13 NOTE — Anesthesia Procedure Notes (Signed)
Anesthesia Regional Block: Interscalene brachial plexus block   Pre-Anesthetic Checklist: , timeout performed,  Correct Patient, Correct Site, Correct Laterality,  Correct Procedure, Correct Position, site marked,  Risks and benefits discussed,  Surgical consent,  Pre-op evaluation,  At surgeon's request and post-op pain management  Laterality: Left  Prep: chloraprep       Needles:  Injection technique: Single-shot  Needle Type: Echogenic Stimulator Needle     Needle Length: 9cm  Needle Gauge: 21     Additional Needles:   Procedures:,,,, ultrasound used (permanent image in chart),,    Narrative:  Start time: 05/13/2023 11:26 AM End time: 05/13/2023 11:28 AM Injection made incrementally with aspirations every 5 mL.  Performed by: Personally  Anesthesiologist: [place], MD  Additional Notes: Discussed risks and benefits of the nerve block in detail, including but not limited vascular injury, permanent nerve damage and infection.   Patient tolerated the procedure well. Local anesthetic introduced in an incremental fashion under minimal resistance after negative aspirations. No paresthesias were elicited. After completion of the procedure, no acute issues were identified and patient continued to be monitored by RN.

## 2023-05-13 NOTE — Progress Notes (Addendum)
Patient reported he had cream and sugar with his coffee at 0610 am this morning. Full cup of coffee

## 2023-05-13 NOTE — Transfer of Care (Signed)
Immediate Anesthesia Transfer of Care Note  Patient: Roger Morton  Procedure(s) Performed: REVERSE SHOULDER ARTHROPLASTY (Left: Shoulder)  Patient Location: PACU  Anesthesia Type:General  Level of Consciousness: awake, alert , and patient cooperative  Airway & Oxygen Therapy: Patient Spontanous Breathing and Patient connected to face mask oxygen  Post-op Assessment: Report given to RN and Post -op Vital signs reviewed and stable  Post vital signs: Reviewed and stable  Last Vitals:  Vitals Value Taken Time  BP 148/99 05/13/23 1501  Temp    Pulse 101 05/13/23 1503  Resp 22 05/13/23 1503  SpO2 99 % 05/13/23 1503  Vitals shown include unfiled device data.  Last Pain:  Vitals:   05/13/23 1145  TempSrc:   PainSc: 0-No pain         Complications: No notable events documented.

## 2023-05-13 NOTE — Interval H&P Note (Signed)
History and Physical Interval Note:  05/13/2023 11:02 AM  Roger Morton  has presented today for surgery, with the diagnosis of Left shoulder osteoarthritis.  The various methods of treatment have been discussed with the patient and family. After consideration of risks, benefits and other options for treatment, the patient has consented to  Procedure(s): REVERSE SHOULDER ARTHROPLASTY (Left) as a surgical intervention.  The patient's history has been reviewed, patient examined, no change in status, stable for surgery.  I have reviewed the patient's chart and labs.  Questions were answered to the patient's satisfaction.     Verlee Rossetti

## 2023-05-13 NOTE — Anesthesia Procedure Notes (Signed)
Procedure Name: Intubation Date/Time: 05/13/2023 1:29 PM  Performed by: Sindy Guadeloupe, CRNAPre-anesthesia Checklist: Patient identified, Emergency Drugs available, Suction available, Patient being monitored and Timeout performed Patient Re-evaluated:Patient Re-evaluated prior to induction Oxygen Delivery Method: Circle system utilized Preoxygenation: Pre-oxygenation with 100% oxygen Induction Type: IV induction Ventilation: Mask ventilation without difficulty Laryngoscope Size: Mac and 4 Grade View: Grade I Tube type: Oral Number of attempts: 1 Airway Equipment and Method: Stylet Placement Confirmation: ETT inserted through vocal cords under direct vision, positive ETCO2 and breath sounds checked- equal and bilateral Secured at: 23 cm Tube secured with: Tape Dental Injury: Teeth and Oropharynx as per pre-operative assessment

## 2023-05-13 NOTE — Discharge Instructions (Signed)
Ice to the shoulder constantly.  Keep the incision covered and clean and dry for one week, then ok to get it wet in the shower.  Do exercise as instructed several times per day.  DO NOT reach behind your back or push up out of a chair with the operative arm.  Use a sling while you are up and around for comfort, may remove while seated.  Keep pillow propped behind the operative elbow.  Follow up with Dr Ranell Patrick in two weeks in the office, call [phone] for appt

## 2023-05-13 NOTE — Anesthesia Preprocedure Evaluation (Addendum)
Anesthesia Evaluation  Patient identified by MRN, date of birth, ID band Patient awake    Reviewed: Allergy & Precautions, H&P , NPO status , Patient's Chart, lab work & pertinent test results  Airway Mallampati: II  TM Distance: >3 FB Neck ROM: Full    Dental no notable dental hx.    Pulmonary Current Smoker and Patient abstained from smoking.   Pulmonary exam normal breath sounds clear to auscultation       Cardiovascular hypertension, Normal cardiovascular exam Rhythm:Regular Rate:Normal     Neuro/Psych Seizures -, Well Controlled,  PSYCHIATRIC DISORDERS Anxiety Depression    L-facial paralysis Last sz 2013. No longer on AED CVA    GI/Hepatic Neg liver ROS,GERD  ,,  Endo/Other  negative endocrine ROS    Renal/GU negative Renal ROS  negative genitourinary   Musculoskeletal  (+) Arthritis ,    Abdominal   Peds negative pediatric ROS (+)  Hematology  (+) Blood dyscrasia, anemia   Anesthesia Other Findings   Reproductive/Obstetrics negative OB ROS                             Anesthesia Physical Anesthesia Plan  ASA: 3  Anesthesia Plan: General and Regional   Post-op Pain Management:    Induction: Intravenous  PONV Risk Score and Plan: Ondansetron and Dexamethasone  Airway Management Planned: Oral ETT  Additional Equipment:   Intra-op Plan:   Post-operative Plan: Extubation in OR  Informed Consent: I have reviewed the patients History and Physical, chart, labs and discussed the procedure including the risks, benefits and alternatives for the proposed anesthesia with the patient or authorized representative who has indicated his/her understanding and acceptance.     Dental advisory given  Plan Discussed with: CRNA  Anesthesia Plan Comments: (Case delay for not maintaining NPO guidelines (Coffee with cream -delay 6 hrs). )       Anesthesia Quick Evaluation

## 2023-05-13 NOTE — Op Note (Unsigned)
NAME: Roger Morton, FLY MEDICAL RECORD NO: [id number] ACCOUNT NO: [id number] DATE OF BIRTH: [birth year] FACILITY: [facility] LOCATION: [id number] PHYSICIAN: Almedia Balls. Ranell Patrick, MD  Operative Report   DATE OF PROCEDURE: 05/13/2023  PREOPERATIVE DIAGNOSIS:  Left shoulder end-stage arthritis/AVN with collapse.  POSTOPERATIVE DIAGNOSIS:  Left shoulder end-stage arthritis/AVN with collapse.  PROCEDURE PERFORMED:  Left reverse total shoulder arthroplasty using DePuy Delta Xtend prosthesis with no subscap repair.  ATTENDING SURGEON:  Almedia Balls. Ranell Patrick, MD  ASSISTANT:  Konrad Felix Dixon, [place], who was scrubbed during the entire procedure, and necessary for satisfactory completion of surgery.  ANESTHESIA:  General anesthesia plus interscalene block was utilized.  ESTIMATED BLOOD LOSS:  Less than 100 mL  FLUID REPLACEMENT:  1000 mL crystalloid.  COUNTS:  Instrument counts correct.  COMPLICATIONS:  No complications.  ANTIBIOTICS:  Perioperative antibiotics were given.  INDICATIONS:  The patient is a 52 year old male with worsening left shoulder pain secondary to AVN with collapse and resulting arthritis.  The patient has had ongoing pain despite conservative management.  He is status post right reverse shoulder  replacement and done well with that.  He desires left total shoulder arthroplasty/reverse to eliminate pain and restore function to his shoulder.  Informed consent obtained.  DESCRIPTION OF PROCEDURE:  After an adequate level of anesthesia was achieved, the patient was positioned in modified beach chair position.  Left shoulder correctly identified and sterile prep and drape performed.  Timeout called, verifying correct  patient, correct site, we entered the left shoulder using a standard deltopectoral approach, starting at the coracoid process extending down the anterior humerus.  Dissection down to the deltopectoral interval.  Cephalic vein identified and taken  laterally with the deltoid,  pectoralis taken medially.  Conjoined tendon identified and retracted medially.  Deep retractors placed.  The biceps tendon was tenodesed in situ with 0 Vicryl figure-of-eight suture x2.  We then released the subscapularis  subperiosteally off the lesser tuberosity and tagged for protection of the axillary nerve.  We then released the inferior capsule extending the shoulder and delivering the humeral head out of the wound.  We entered the proximal humerus that did have AVN  with large collapsed area with a 6 mm reamer, we reamed up to a size 12.  We then placed our 12 mm T handle guide and resected the head at 20 degrees of retroversion.  Once we had that head resected, we removed excess humeral osteophytes with a rongeur.   We then subluxed the humerus posteriorly, gaining good exposure of the glenoid, removed the biceps stump, the couple loose bodies and then the capsule and labrum.  We were careful to protect the axillary nerve throughout the surgery, we placed our deep  retractors on the glenoid.  We had good exposure.  We removed the remaining cartilage from the glenoid with the Cobb elevator.  We then found our center point for a guide pin and drilled low in the glenoid bicortical.  Next, we reamed to the subchondral  bone for the metaglene baseplate.  We did our peripheral hand reaming with the T-handle reamer and then drilled out our central peg hole.  Next, we impacted the HA coated press-fit baseplate into position.  We placed a 30 screw inferiorly, a 36 screw  superiorly and an 18 nonlocked posteriorly.  We were able to get good screw purchase and excellent baseplate support.  We then selected a 42 standard +0 glenosphere and attached that to the baseplate with screwdriver. We went to the humeral  side, reamed  for the 2 left metaphysis.  We then trialed with the 12 stem and the 2 left set on the 0 setting and placed in 20 degrees of retroversion.  We used a 42+3 poly trial placed on the humeral  tray and reduced the shoulder.  We were happy with our soft tissue  balancing.  We removed the trial components.  At this point, we irrigated thoroughly and then used available bone graft from the humeral head in impaction grafting technique.  We took the HA coated press fit 12 stem and the 2 left metaphysis set on the  0 setting and impacted in 20 degrees of retroversion. With the stem stable, we selected the real 42+3 poly placed on the humeral tray impacted that reduced the shoulder, had nice little pop as it reduced.  Appropriate tension throughout a full arc of  motion.  Good tension on the conjoined.  We irrigated thoroughly, resected the subscap remnant.  We then repaired deltopectoral interval with 0 Vicryl suture followed by 2-0 Vicryl for subcutaneous closure and 4-0 Monocryl for skin.  Steri-Strips applied  followed by sterile dressing.  The patient tolerated surgery well.   PUS D: 05/13/2023 2:51:19 pm T: 05/13/2023 7:48:00 pm  JOB: [id number]/ [id number]

## 2023-05-13 NOTE — Brief Op Note (Signed)
05/13/2023  2:46 PM  PATIENT:  Roger Morton  52 y.o. male  PRE-OPERATIVE DIAGNOSIS:  Left shoulder osteoarthritis, end stage  POST-OPERATIVE DIAGNOSIS:  Left shoulder osteoarthritis, end stage  PROCEDURE:  Procedure(s): REVERSE SHOULDER ARTHROPLASTY (Left) DePuy Delta Xtend with NO subscap repair  SURGEON:  Surgeons and Role:    Beverely Low, MD - Primary  PHYSICIAN ASSISTANT:   ASSISTANTS: Thea Gist, PA-C   ANESTHESIA:   regional and general  EBL:  100 mL   BLOOD ADMINISTERED:none  DRAINS: none   LOCAL MEDICATIONS USED:  MARCAINE     SPECIMEN:  No Specimen  DISPOSITION OF SPECIMEN:  N/A  COUNTS:  YES  TOURNIQUET:  * No tourniquets in log *  DICTATION: .Other Dictation: Dictation Number [id number]  PLAN OF CARE: Admit for overnight observation  PATIENT DISPOSITION:  PACU - hemodynamically stable.   Delay start of Pharmacological VTE agent (>24hrs) due to surgical blood loss or risk of bleeding: not applicable

## 2023-05-14 DIAGNOSIS — M19012 Primary osteoarthritis, left shoulder: Secondary | ICD-10-CM | POA: Diagnosis not present

## 2023-05-14 NOTE — Progress Notes (Signed)
   Subjective:  Patient reports pain as mild to moderate.  Did not rest well overnight.  However pain is well-controlled.  Resting comfortably this morning with his sling donned on left arm.  Objective:   VITALS:   Vitals:   05/13/23 2331 05/13/23 2332 05/14/23 0244 05/14/23 0535  BP: 98/68 101/63 (!) 129/90 117/80  Pulse: 95 94 80 89  Resp: 20 20 20 20   Temp: 98.1 F (36.7 C)  (!) 97.4 F (36.3 C) 97.8 F (36.6 C)  TempSrc: Oral  Oral Oral  SpO2: 95% 95% 95% 96%  Weight:      Height:        Neurologically intact ABD soft Neurovascular intact Sensation intact distally Intact pulses distally Postoperative bandages clean dry and intact Sling donned appropriately  Lab Results  Component Value Date   WBC 9.9 05/13/2023   HGB 15.7 05/13/2023   HCT 46.7 05/13/2023   MCV 92.3 05/13/2023   PLT 243 05/13/2023   BMET    Component Value Date/Time   NA 137 05/13/2023 0840   NA 141 01/29/2021 0949   K 3.8 05/13/2023 0840   CL 105 05/13/2023 0840   CO2 24 05/13/2023 0840   GLUCOSE 97 05/13/2023 0840   BUN 18 05/13/2023 0840   BUN 12 01/29/2021 0949   CREATININE 0.54 (L) 05/13/2023 0840   CALCIUM 9.3 05/13/2023 0840   EGFR 109 01/29/2021 0949   GFRNONAA >60 05/13/2023 0840     Assessment/Plan: 1 Day Post-Op   Principal Problem:   H/O total shoulder replacement, left   Up with therapy -Patient doing well this morning.  We discussed precautions and postoperative recommendations per Dr. Devonne Doughty.  He will be appropriate to discharge home this morning.  I do believe he will need transportation services to help accomplish that.  -Please change postoperative bandage to Aquacel before he leaves the floor.   Yolonda Kida 05/14/2023, 9:20 AM   Maryan Rued, MD [phone]

## 2023-05-14 NOTE — Evaluation (Signed)
Physical Therapy Evaluation Patient Details Name: Roger Morton MRN: [id number] DOB: Mar 11, 1971 Today's Date: 05/14/2023  History of Present Illness  52 yo male s/p L rTSA on 05/14/23; PMH: BPH, chronic pain, L hip fx with IM nail 2022, brain tumor s/p r esection with intraoperative CVA abd residual L side deficits. R rTSA  Clinical Impression  Pt admitted with above diagnosis.  Pt pleasant and agreeable to PT, pt to return to [facility] at d/c Patient will benefit from continued  follow up therapy, <3 hours/day Will follow if pt does not d/c today  Pt currently with functional limitations due to the deficits listed below (see PT Problem List). Pt will benefit from acute skilled PT to increase their independence and safety with mobility to allow discharge.           If plan is discharge home, recommend the following: A little help with walking and/or transfers;A little help with bathing/dressing/bathroom;Assist for transportation   Can travel by private vehicle        Equipment Recommendations None recommended by PT  Recommendations for Other Services       Functional Status Assessment Patient has had a recent decline in their functional status and demonstrates the ability to make significant improvements in function in a reasonable and predictable amount of time.     Precautions / Restrictions Precautions Precautions: Shoulder;Fall Shoulder Interventions: Shoulder sling/immobilizer Precaution Comments: No ROM L shoulder Restrictions LUE Weight Bearing: Non weight bearing      Mobility  Bed Mobility Overal bed mobility: Needs Assistance Bed Mobility: Supine to Sit     Supine to sit: Supervision     General bed mobility comments: for safety, cues for NWB L shoulder/UE    Transfers Overall transfer level: Needs assistance Equipment used: Rolling walker (2 wheels) Transfers: Sit to/from Stand Sit to Stand: Contact guard assist, Min assist            General transfer comment: cues for hand placement, steadying assist    Ambulation/Gait Ambulation/Gait assistance: Min assist Gait Distance (Feet): 20 Feet Assistive device: Rolling walker (2 wheels) Gait Pattern/deviations: Step-through pattern, Decreased stride length       General Gait Details: single hand drive (RUE) and PT assisting on L for RW stability, fatigue and some incr shoulder pain after distance above so provided seated rest  Stairs            Wheelchair Mobility     Tilt Bed    Modified Rankin (Stroke Patients Only)       Balance Overall balance assessment: Needs assistance         Standing balance support: During functional activity, Reliant on assistive device for balance, Single extremity supported Standing balance-Leahy Scale: Fair                               Pertinent Vitals/Pain Pain Assessment Pain Assessment: 0-10 Pain Location: L shoulder Pain Descriptors / Indicators: Aching, Discomfort, Sore Pain Intervention(s): Limited activity within patient's tolerance, Monitored during session, Premedicated before session, Repositioned    Home Living                     Additional Comments: lives at [facility].    Prior Function               Mobility Comments: typically only transfers to wc but walks with walker with therapy ADLs Comments: mod I  from wc     Extremity/Trunk Assessment   Upper Extremity Assessment Upper Extremity Assessment: Defer to OT evaluation    Lower Extremity Assessment Lower Extremity Assessment:  (pt is grossly at baseline LE AROM and strength)       Communication      Cognition Arousal: Alert Behavior During Therapy: WFL for tasks assessed/performed Overall Cognitive Status: Within Functional Limits for tasks assessed                                          General Comments      Exercises     Assessment/Plan    PT Assessment All  further PT needs can be met in the next venue of care  PT Problem List Decreased activity tolerance;Decreased balance;Decreased mobility;Decreased knowledge of precautions       PT Treatment Interventions DME instruction;Gait training;Functional mobility training;Therapeutic activities;Therapeutic exercise;Wheelchair mobility training;Patient/family education    PT Goals (Current goals can be found in the Care Plan section)  Acute Rehab PT Goals PT Goal Formulation: With patient Time For Goal Achievement: 05/21/23 Potential to Achieve Goals: Good    Frequency Min 3X/week     Co-evaluation               AM-PAC PT "6 Clicks" Mobility  Outcome Measure Help needed turning from your back to your side while in a flat bed without using bedrails?: A Little Help needed moving from lying on your back to sitting on the side of a flat bed without using bedrails?: A Little Help needed moving to and from a bed to a chair (including a wheelchair)?: A Little Help needed standing up from a chair using your arms (e.g., wheelchair or bedside chair)?: A Little Help needed to walk in hospital room?: A Little Help needed climbing 3-5 steps with a railing? : A Lot 6 Click Score: 17    End of Session Equipment Utilized During Treatment: Gait belt Activity Tolerance: Patient tolerated treatment well;Patient limited by fatigue Patient left: in chair;with call bell/phone within reach;with chair alarm set   PT Visit Diagnosis: Other abnormalities of gait and mobility (R26.89);Difficulty in walking, not elsewhere classified (R26.2)    Time: 4782-9562 PT Time Calculation (min) (ACUTE ONLY): 11 min   Charges:   PT Evaluation $PT Eval Low Complexity: 1 Low   PT General Charges $$ ACUTE PT VISIT: 1 Visit         Shonn Farruggia, PT  Acute Rehab Dept [facility]) [phone]  05/14/2023   [facility] 05/14/2023, 10:51 AM

## 2023-05-14 NOTE — Progress Notes (Signed)
I contacted the patient's nurse from [facility] to give her report on patient. I changed the patient's dressing from gauze to acquacel per order.(Includes steristrips underneath the acquacel). I also attempted to call the transportation company [employer]. They were closed. I reached out to the Child psychotherapist. She called an alternate transportation service [facility]. They will be here to pick up the patient.

## 2023-05-14 NOTE — TOC Transition Note (Signed)
Transition of Care [facility]) - CM/SW Discharge Note   Patient Details  Name: Roger Morton MRN: [id number] Date of Birth: 06/25/1971  [facility] CM/SW Contact:  Georgie Chard, LCSW Phone Number: 05/14/2023, 3:13 PM   Clinical Narrative:    CSW has arranged transport for the patient. AT this time [facility] with take the patient back to [facility]. There are no further TOC needs at this time.          Patient Goals and CMS Choice      Discharge Placement                         Discharge Plan and Services Additional resources added to the After Visit Summary for                                       Social Determinants of Health (SDOH) Interventions SDOH Screenings   Food Insecurity: No Food Insecurity (05/13/2023)  Housing: Low Risk  (05/13/2023)  Transportation Needs: No Transportation Needs (05/13/2023)  Utilities: Not At Risk (05/13/2023)  Alcohol Screen: Low Risk  (05/25/2021)  Depression (PHQ2-9): Low Risk  (05/25/2021)  Financial Resource Strain: Low Risk  (05/25/2021)  Physical Activity: Sufficiently Active (05/25/2021)  Social Connections: Moderately Integrated (05/25/2021)  Stress: No Stress Concern Present (05/25/2021)  Tobacco Use: High Risk (05/13/2023)     Readmission Risk Interventions     No data to display

## 2023-05-14 NOTE — Plan of Care (Signed)
Problem: Education: Goal: Knowledge of the prescribed therapeutic regimen will improve Outcome: Adequate for Discharge Goal: Understanding of activity limitations/precautions following surgery will improve Outcome: Adequate for Discharge Goal: Individualized Educational Video(s) Outcome: Adequate for Discharge   Problem: Activity: Goal: Ability to tolerate increased activity will improve Outcome: Adequate for Discharge   Problem: Pain Management: Goal: Pain level will decrease with appropriate interventions Outcome: Adequate for Discharge   Problem: Education: Goal: Knowledge of General Education information will improve Description: Including pain rating scale, medication(s)/side effects and non-pharmacologic comfort measures Outcome: Adequate for Discharge   Problem: Health Behavior/Discharge Planning: Goal: Ability to manage health-related needs will improve Outcome: Adequate for Discharge   Problem: Clinical Measurements: Goal: Ability to maintain clinical measurements within normal limits will improve Outcome: Adequate for Discharge Goal: Will remain free from infection Outcome: Adequate for Discharge Goal: Diagnostic test results will improve Outcome: Adequate for Discharge Goal: Respiratory complications will improve Outcome: Adequate for Discharge Goal: Cardiovascular complication will be avoided Outcome: Adequate for Discharge   Problem: Activity: Goal: Risk for activity intolerance will decrease Outcome: Adequate for Discharge   Problem: Nutrition: Goal: Adequate nutrition will be maintained Outcome: Adequate for Discharge   Problem: Coping: Goal: Level of anxiety will decrease Outcome: Adequate for Discharge   Problem: Elimination: Goal: Will not experience complications related to bowel motility Outcome: Adequate for Discharge Goal: Will not experience complications related to urinary retention Outcome: Adequate for Discharge   Problem: Pain  Managment: Goal: General experience of comfort will improve Outcome: Adequate for Discharge   Problem: Safety: Goal: Ability to remain free from injury will improve Outcome: Adequate for Discharge   Problem: Skin Integrity: Goal: Risk for impaired skin integrity will decrease Outcome: Adequate for Discharge

## 2023-05-14 NOTE — Evaluation (Signed)
Occupational Therapy Evaluation Patient Details Name: Roger Morton MRN: [id number] DOB: 28-Mar-1971 Today's Date: 05/14/2023   History of Present Illness 52 yo male s/p L rTSA on 05/14/23; PMH: BPH, chronic pain, L hip fx with IM nail 2022, brain tumor s/p r esection with intraoperative CVA abd residual L side deficits. R rTSA   Clinical Impression   Pt is a 52 year old male, s/p reverse shoulder replacement without functional use of non-dominant upper extremity secondary to effects of surgery and interscalene block and shoulder precautions. Therapist provided education and instruction to patient in regards to exercises, precautions, positioning, donning upper extremity clothing and bathing while maintaining shoulder precautions, ice and edema management and donning/doffing sling. Patient verbalized understanding and demonstrated as needed. Patient will need  assistance to donn shirt, underwear, pants, socks and shoes and provided with instruction on compensatory strategies to perform ADLs. Patient limited by decreased ROM in RT shoulder so therefore will need some form of assistance at home, which will be provided by staff at his facility.   Patient verbalized and/or demonstrated understanding to all instruction. Patient to follow up with MD for further therapy needs.   Acute OT to sign off.      If plan is discharge home, recommend the following: A little help with walking and/or transfers;Supervision due to cognitive status;Direct supervision/assist for medications management;Direct supervision/assist for financial management;Assist for transportation;Assistance with cooking/housework;A little help with bathing/dressing/bathroom    Functional Status Assessment  Patient has had a recent decline in their functional status and demonstrates the ability to make significant improvements in function in a reasonable and predictable amount of time.  Equipment Recommendations  None recommended by  OT    Recommendations for Other Services       Precautions / Restrictions Precautions Precautions: Shoulder;Fall Type of Shoulder Precautions: rTSA Shoulder Interventions: Shoulder sling/immobilizer Precaution Booklet Issued: Yes (comment) Precaution Comments: No ROM L shoulder: Sling at all times except ADL/exercise: Yes.  Non weight bearing: Yes. AROM elbow, wrist and hand to tolerance: Yes. PROM of shoulder: No. AROM of shoulder: No. Required Braces or Orthoses: Sling Restrictions Weight Bearing Restrictions: Yes LUE Weight Bearing: Non weight bearing      Mobility Bed Mobility               General bed mobility comments: Pt received in recliner.  Reinforced not leveraging with LUE to get in or out of bed.    Transfers                          Balance Overall balance assessment: Needs assistance   Sitting balance-Leahy Scale: Good     Standing balance support: During functional activity, Reliant on assistive device for balance, Single extremity supported Standing balance-Leahy Scale: Fair                             ADL either performed or assessed with clinical judgement   ADL Overall ADL's : Needs assistance/impaired Eating/Feeding: Minimal assistance   Grooming: Sitting;Set up Grooming Details (indicate cue type and reason): WC level for ADLs at baseline. Upper Body Bathing: Minimal assistance   Lower Body Bathing: Minimal assistance;Sit to/from stand;Sitting/lateral leans   Upper Body Dressing : Minimal assistance   Lower Body Dressing: Minimal assistance;Moderate assistance   Toilet Transfer: Minimal assistance     Toileting - Clothing Manipulation Details (indicate cue type and reason): Pt reported concerns for hygiene  after discharge. Pt reports there is no bidet in his room but staff can assist.     Functional mobility during ADLs: Minimal assistance;Contact guard assist;Cueing for safety       Vision Baseline  Vision/History: 0 No visual deficits Additional Comments: Reading glasses at pt's facility.     Perception         Praxis         Pertinent Vitals/Pain Pain Assessment Pain Assessment: 0-10 Pain Score: 4  Pain Location: L shoulder Pain Descriptors / Indicators: Aching, Discomfort, Sore Pain Intervention(s): Limited activity within patient's tolerance, Premedicated before session, Monitored during session     Extremity/Trunk Assessment Upper Extremity Assessment Upper Extremity Assessment:  (RUE WNL) LUE: Unable to fully assess due to immobilization   Lower Extremity Assessment Lower Extremity Assessment:  (pt is grossly at baseline LE AROM and strength)       Communication Communication Communication:  (dysarthric.) Cueing Techniques: Verbal cues   Cognition Arousal: Alert Behavior During Therapy: WFL for tasks assessed/performed Overall Cognitive Status: Within Functional Limits for tasks assessed                                 General Comments: Baseline     General Comments       Exercises Other Exercises Other Exercises: Pt educated on hand/wrist/forearm and elbow exercises and demonstrated understanding on LUE except elbow due to continued post-op numbness, so pt demonstrated on RUE.   Shoulder Instructions Shoulder Instructions Donning/doffing shirt without moving shoulder: Minimal assistance;Moderate assistance Method for sponge bathing under operated UE: Moderate assistance Donning/doffing sling/immobilizer: Minimal assistance;Moderate assistance Correct positioning of sling/immobilizer: Modified independent ROM for elbow, wrist and digits of operated UE: Modified independent Sling wearing schedule (on at all times/off for ADL's): Supervision/safety Proper positioning of operated UE when showering: Supervision/safety;Min-guard Dressing change: Maximal assistance Positioning of UE while sleeping: Supervision/safety;Min-guard;Set-up     Home Living Family/patient expects to be discharged to:: Skilled nursing facility                                 Additional Comments: lives at [facility].      Prior Functioning/Environment Prior Level of Function : Independent/Modified Independent             Mobility Comments: typically only transfers to wc but walks with walker with therapy ADLs Comments: mod I from wc.        OT Problem List: Impaired balance (sitting and/or standing);Impaired UE functional use;Decreased range of motion;Decreased strength      OT Treatment/Interventions:      OT Goals(Current goals can be found in the care plan section) Acute Rehab OT Goals OT Goal Formulation: All assessment and education complete, DC therapy Potential to Achieve Goals: Good ADL Goals Pt/caregiver will Perform Home Exercise Program: With written HEP provided;Increased ROM;Left upper extremity;With Supervision Additional ADL Goal #1: Pt will demonstrate or verbalize understanding to UE/LE dressing, donning/doffing of sling, correct positioning of LT UE, and compensatory strategies for LT axilla hygiene, as well as use of Ice man cryo cuff, while correctly following all shoulder post-op precautions/restrictions.  OT Frequency:      Co-evaluation              AM-PAC OT "6 Clicks" Daily Activity     Outcome Measure Help from another person eating meals?: A Little Help from  another person taking care of personal grooming?: A Little Help from another person toileting, which includes using toliet, bedpan, or urinal?: A Lot Help from another person bathing (including washing, rinsing, drying)?: A Little Help from another person to put on and taking off regular upper body clothing?: A Little Help from another person to put on and taking off regular lower body clothing?: A Little 6 Click Score: 17   End of Session Equipment Utilized During Treatment: Gait belt Nurse Communication: Other  (comment) (OT completed.)  Activity Tolerance: Patient tolerated treatment well Patient left: in chair;with call bell/phone within reach;with chair alarm set  OT Visit Diagnosis: Unsteadiness on feet (R26.81);Muscle weakness (generalized) (M62.81)                Time: 1610-9604 OT Time Calculation (min): 34 min Charges:  OT General Charges $OT Visit: 1 Visit OT Evaluation $OT Eval Low Complexity: 1 Low OT Treatments $Self Care/Home Management : 8-22 mins  Victorino Dike, OT Acute Rehab Services Office: [phone] 05/14/2023   Theodoro Clock 05/14/2023, 2:23 PM

## 2023-05-14 NOTE — Progress Notes (Signed)
Reviewed written discharge instructions with patient. All questions answered. Patient verbalized understanding. Discharged via wheelchair with all belongings... in stable condition. 

## 2023-05-16 ENCOUNTER — Encounter (HOSPITAL_COMMUNITY): Payer: Self-pay | Admitting: Orthopedic Surgery

## 2023-05-31 NOTE — Discharge Summary (Signed)
In most cases prophylactic antibiotics for Dental procdeures after total joint surgery are not necessary.  Exceptions are as follows:  1. History of prior total joint infection  2. Severely immunocompromised (Organ Transplant, cancer chemotherapy, Rheumatoid biologic meds such as Humera)  3. Poorly controlled diabetes (A1C &gt; 8.0, blood glucose over 200)  If you have one of these conditions, contact your surgeon for an antibiotic prescription, prior to your dental procedure. Orthopedic Discharge Summary        Physician Discharge Summary  Patient ID: Roger Morton MRN: [id number] DOB/AGE: [birth year] 52 y.o.  Admit date: 05/13/2023 Discharge date: 05/14/23    Procedures:  Procedure(s) (LRB): REVERSE SHOULDER ARTHROPLASTY (Left)  Attending Physician:  Dr. Malon Kindle  Admission Diagnoses:   left shoulder cuff arthropathy  Discharge Diagnoses:  left shoulder cuff arthropathy   Past Medical History:  Diagnosis Date   Anemia    Anxiety    BPH (benign prostatic hyperplasia)    Chronic pain    Depression    Dysphagia    Essential hypertension    Essential tremor    Facial paralysis on left side    GERD (gastroesophageal reflux disease)    Hemiplegia and hemiparesis following cerebral infarction affecting left non-dominant side (HCC)    History of brain tumor    History of COVID-19 08/11/2022   Hyperlipidemia    Insomnia    Left leg weakness    S/P stroke   Neuropathy    OA (osteoarthritis)    OAB (overactive bladder)    Seizure disorder (HCC)    Sinus tachycardia    Stroke [facility])    had brain tumor, and had CVAs during operation for his brain tumor    PCP: Heather Roberts, NP   Discharged Condition: good  Hospital Course:  Patient underwent the above stated procedure on 05/13/2023. Patient tolerated the procedure well and brought to the recovery room in good condition and subsequently to the floor. Patient had an uncomplicated hospital course and  was stable for discharge.   Disposition: Discharge disposition: 01-Home or Self Care      with follow up in 2 weeks    Follow-up Information     Beverely Low, MD. Call in 2 week(s).   Specialty: Orthopedic Surgery Why: call for follow up appointment in two weeks with Dr Phineas Inches information: [address] [place] [place] [address] [phone]                 Dental Antibiotics:  In most cases prophylactic antibiotics for Dental procdeures after total joint surgery are not necessary.  Exceptions are as follows:  1. History of prior total joint infection  2. Severely immunocompromised (Organ Transplant, cancer chemotherapy, Rheumatoid biologic meds such as Humera)  3. Poorly controlled diabetes (A1C &gt; 8.0, blood glucose over 200)  If you have one of these conditions, contact your surgeon for an antibiotic prescription, prior to your dental procedure.  Discharge Instructions     Call MD / Call 911   Complete by: As directed    If you experience chest pain or shortness of breath, CALL 911 and be transported to the hospital emergency room.  If you develope a fever above 101 F, pus (white drainage) or increased drainage or redness at the wound, or calf pain, call your surgeon's office.   Constipation Prevention   Complete by: As directed    Drink plenty of fluids.  Prune juice may be helpful.  You may use  a stool softener, such as Colace (over the counter) 100 mg twice a day.  Use MiraLax (over the counter) for constipation as needed.   Diet - low sodium heart healthy   Complete by: As directed    Increase activity slowly as tolerated   Complete by: As directed    Post-operative opioid taper instructions:   Complete by: As directed    POST-OPERATIVE OPIOID TAPER INSTRUCTIONS: It is important to wean off of your opioid medication as soon as possible. If you do not need pain medication after your surgery it is ok to stop day one. Opioids  include: Codeine, Hydrocodone(Norco, Vicodin), Oxycodone(Percocet, oxycontin) and hydromorphone amongst others.  Long term and even short term use of opiods can cause: Increased pain response Dependence Constipation Depression Respiratory depression And more.  Withdrawal symptoms can include Flu like symptoms Nausea, vomiting And more Techniques to manage these symptoms Hydrate well Eat regular healthy meals Stay active Use relaxation techniques(deep breathing, meditating, yoga) Do Not substitute Alcohol to help with tapering If you have been on opioids for less than two weeks and do not have pain than it is ok to stop all together.  Plan to wean off of opioids This plan should start within one week post op of your joint replacement. Maintain the same interval or time between taking each dose and first decrease the dose.  Cut the total daily intake of opioids by one tablet each day Next start to increase the time between doses. The last dose that should be eliminated is the evening dose.          Allergies as of 05/14/2023       Reactions   Penicillins    Per MAR         Medication List     TAKE these medications    acetaminophen 325 MG tablet Commonly known as: TYLENOL Take 650 mg by mouth 2 (two) times daily. May take an additional 650 mg if needed every 4 hours for pain   Artificial Tears 1 % ophthalmic solution Generic drug: carboxymethylcellulose Place 1 drop into both eyes in the morning, at noon, in the evening, and at bedtime.   carboxymethylcellulose 1 % ophthalmic solution Place 1 drop into the left eye at bedtime. Refresh liquigel   aspirin 81 MG chewable tablet Chew 81 mg by mouth daily.   atorvastatin 40 MG tablet Commonly known as: LIPITOR Take 40 mg by mouth daily at 4 PM.   azelastine 0.1 % nasal spray Commonly known as: ASTELIN Place 2 sprays into both nostrils 2 (two) times daily. Use in each nostril as directed   baclofen 20 MG  tablet Commonly known as: LIORESAL Take 20 mg by mouth in the morning, at noon, and at bedtime.   busPIRone 10 MG tablet Commonly known as: BUSPAR Take 10 mg by mouth 3 (three) times daily.   celecoxib 200 MG capsule Commonly known as: CELEBREX Take 1 capsule (200 mg total) by mouth daily.   clonazePAM 0.25 MG disintegrating tablet Commonly known as: KLONOPIN Take 0.25 mg by mouth 2 (two) times daily.   diclofenac Sodium 1 % Gel Commonly known as: VOLTAREN Apply 1 g topically 3 (three) times daily. Apply to left hips topically   Fish Oil 1000 MG Caps Take 1,000 mg by mouth daily.   fluticasone 50 MCG/ACT nasal spray Commonly known as: FLONASE Place 1 spray into both nostrils daily.   gabapentin 300 MG capsule Commonly known as: NEURONTIN Take 300-900 mg  by mouth See admin instructions. Take 300 mg in the morning and 900 mg at bedtime   ibuprofen 400 MG tablet Commonly known as: ADVIL Take 400 mg by mouth every 8 (eight) hours as needed (Back pain).   lidocaine 4 % Apply 1 patch topically daily. Apply to right shoulder for 12 hours max dose =3 patches remove per schedule   melatonin 5 MG Tabs Take 5 mg by mouth at bedtime.   mirabegron ER 25 MG Tb24 tablet Commonly known as: MYRBETRIQ Take 1 tablet (25 mg total) by mouth daily.   nortriptyline 10 MG capsule Commonly known as: PAMELOR Take 10-50 mg by mouth See admin instructions. Take 10 mg in the morning and 50 ng at bedtime   oxycodone 5 MG capsule Commonly known as: OXY-IR Take 1 capsule (5 mg total) by mouth every 6 (six) hours as needed for pain.   pantoprazole 20 MG tablet Commonly known as: PROTONIX Take 20 mg by mouth every morning. 0700   propranolol 10 MG tablet Commonly known as: INDERAL Take 10 mg by mouth 2 (two) times daily.   QUEtiapine 200 MG tablet Commonly known as: SEROQUEL Take 200 mg by mouth at bedtime.   senna-docusate 8.6-50 MG tablet Commonly known as: Senokot-S Take 2 tablets  by mouth at bedtime.   Systane 0.4-0.3 % Soln Generic drug: Polyethyl Glycol-Propyl Glycol Place 1 drop into both eyes at bedtime. There is a 2nd order in [facility] stating "Instill 1 drop into left eye at bedtime for exposure keratitis"   tamsulosin 0.4 MG Caps capsule Commonly known as: FLOMAX Take 0.4 mg by mouth daily.          Signed: Thea Gist 05/31/2023, 7:34 AM  [facility] is now [facility] [address], [place], [place] [address] Phone: [phone] Facebook  Instagram  [employer]

## 2023-06-22 DIAGNOSIS — G250 Essential tremor: Secondary | ICD-10-CM | POA: Diagnosis not present

## 2023-06-22 DIAGNOSIS — G253 Myoclonus: Secondary | ICD-10-CM | POA: Diagnosis not present

## 2023-06-22 DIAGNOSIS — T50905A Adverse effect of unspecified drugs, medicaments and biological substances, initial encounter: Secondary | ICD-10-CM | POA: Diagnosis not present

## 2023-09-30 ENCOUNTER — Other Ambulatory Visit: Payer: Self-pay | Admitting: Neurology

## 2023-09-30 DIAGNOSIS — D496 Neoplasm of unspecified behavior of brain: Secondary | ICD-10-CM

## 2023-09-30 DIAGNOSIS — R519 Headache, unspecified: Secondary | ICD-10-CM

## 2023-11-06 ENCOUNTER — Other Ambulatory Visit: Payer: Self-pay

## 2023-11-06 ENCOUNTER — Emergency Department

## 2023-11-06 ENCOUNTER — Inpatient Hospital Stay
Admission: EM | Admit: 2023-11-06 | Discharge: 2023-11-22 | DRG: 064 | Disposition: E | Attending: Pulmonary Disease | Admitting: Pulmonary Disease

## 2023-11-06 DIAGNOSIS — I629 Nontraumatic intracranial hemorrhage, unspecified: Principal | ICD-10-CM

## 2023-11-06 DIAGNOSIS — D333 Benign neoplasm of cranial nerves: Secondary | ICD-10-CM | POA: Diagnosis present

## 2023-11-06 DIAGNOSIS — F419 Anxiety disorder, unspecified: Secondary | ICD-10-CM | POA: Diagnosis present

## 2023-11-06 DIAGNOSIS — A4181 Sepsis due to Enterococcus: Secondary | ICD-10-CM | POA: Diagnosis present

## 2023-11-06 DIAGNOSIS — G935 Compression of brain: Secondary | ICD-10-CM | POA: Diagnosis present

## 2023-11-06 DIAGNOSIS — Z96611 Presence of right artificial shoulder joint: Secondary | ICD-10-CM | POA: Diagnosis present

## 2023-11-06 DIAGNOSIS — I6200 Nontraumatic subdural hemorrhage, unspecified: Secondary | ICD-10-CM | POA: Diagnosis present

## 2023-11-06 DIAGNOSIS — G40909 Epilepsy, unspecified, not intractable, without status epilepticus: Secondary | ICD-10-CM | POA: Diagnosis present

## 2023-11-06 DIAGNOSIS — F32A Depression, unspecified: Secondary | ICD-10-CM | POA: Diagnosis present

## 2023-11-06 DIAGNOSIS — B952 Enterococcus as the cause of diseases classified elsewhere: Secondary | ICD-10-CM | POA: Diagnosis present

## 2023-11-06 DIAGNOSIS — F17210 Nicotine dependence, cigarettes, uncomplicated: Secondary | ICD-10-CM | POA: Diagnosis present

## 2023-11-06 DIAGNOSIS — R739 Hyperglycemia, unspecified: Secondary | ICD-10-CM | POA: Diagnosis present

## 2023-11-06 DIAGNOSIS — R001 Bradycardia, unspecified: Secondary | ICD-10-CM | POA: Diagnosis present

## 2023-11-06 DIAGNOSIS — K219 Gastro-esophageal reflux disease without esophagitis: Secondary | ICD-10-CM | POA: Diagnosis present

## 2023-11-06 DIAGNOSIS — E785 Hyperlipidemia, unspecified: Secondary | ICD-10-CM | POA: Diagnosis present

## 2023-11-06 DIAGNOSIS — N390 Urinary tract infection, site not specified: Secondary | ICD-10-CM | POA: Diagnosis present

## 2023-11-06 DIAGNOSIS — Z7982 Long term (current) use of aspirin: Secondary | ICD-10-CM

## 2023-11-06 DIAGNOSIS — J9601 Acute respiratory failure with hypoxia: Secondary | ICD-10-CM | POA: Diagnosis present

## 2023-11-06 DIAGNOSIS — N400 Enlarged prostate without lower urinary tract symptoms: Secondary | ICD-10-CM | POA: Diagnosis present

## 2023-11-06 DIAGNOSIS — I2510 Atherosclerotic heart disease of native coronary artery without angina pectoris: Secondary | ICD-10-CM | POA: Diagnosis not present

## 2023-11-06 DIAGNOSIS — Z515 Encounter for palliative care: Secondary | ICD-10-CM

## 2023-11-06 DIAGNOSIS — I618 Other nontraumatic intracerebral hemorrhage: Secondary | ICD-10-CM | POA: Diagnosis present

## 2023-11-06 DIAGNOSIS — R4182 Altered mental status, unspecified: Secondary | ICD-10-CM | POA: Diagnosis present

## 2023-11-06 DIAGNOSIS — R29734 NIHSS score 34: Secondary | ICD-10-CM | POA: Diagnosis present

## 2023-11-06 DIAGNOSIS — D72829 Elevated white blood cell count, unspecified: Secondary | ICD-10-CM | POA: Diagnosis present

## 2023-11-06 DIAGNOSIS — Z66 Do not resuscitate: Secondary | ICD-10-CM | POA: Diagnosis present

## 2023-11-06 DIAGNOSIS — Z1152 Encounter for screening for COVID-19: Secondary | ICD-10-CM

## 2023-11-06 DIAGNOSIS — G250 Essential tremor: Secondary | ICD-10-CM | POA: Diagnosis present

## 2023-11-06 DIAGNOSIS — Z8616 Personal history of COVID-19: Secondary | ICD-10-CM | POA: Diagnosis not present

## 2023-11-06 DIAGNOSIS — R569 Unspecified convulsions: Secondary | ICD-10-CM | POA: Diagnosis not present

## 2023-11-06 DIAGNOSIS — I69354 Hemiplegia and hemiparesis following cerebral infarction affecting left non-dominant side: Secondary | ICD-10-CM | POA: Diagnosis not present

## 2023-11-06 DIAGNOSIS — I2489 Other forms of acute ischemic heart disease: Secondary | ICD-10-CM | POA: Diagnosis present

## 2023-11-06 DIAGNOSIS — Z96612 Presence of left artificial shoulder joint: Secondary | ICD-10-CM | POA: Diagnosis present

## 2023-11-06 DIAGNOSIS — G9382 Brain death: Secondary | ICD-10-CM | POA: Diagnosis present

## 2023-11-06 DIAGNOSIS — G510 Bell's palsy: Secondary | ICD-10-CM | POA: Diagnosis present

## 2023-11-06 DIAGNOSIS — N3281 Overactive bladder: Secondary | ICD-10-CM | POA: Diagnosis present

## 2023-11-06 DIAGNOSIS — G4700 Insomnia, unspecified: Secondary | ICD-10-CM | POA: Diagnosis present

## 2023-11-06 DIAGNOSIS — R7989 Other specified abnormal findings of blood chemistry: Secondary | ICD-10-CM

## 2023-11-06 DIAGNOSIS — I10 Essential (primary) hypertension: Secondary | ICD-10-CM | POA: Diagnosis present

## 2023-11-06 DIAGNOSIS — I615 Nontraumatic intracerebral hemorrhage, intraventricular: Principal | ICD-10-CM | POA: Diagnosis present

## 2023-11-06 DIAGNOSIS — R509 Fever, unspecified: Secondary | ICD-10-CM | POA: Diagnosis not present

## 2023-11-06 DIAGNOSIS — Z880 Allergy status to penicillin: Secondary | ICD-10-CM

## 2023-11-06 DIAGNOSIS — Z79899 Other long term (current) drug therapy: Secondary | ICD-10-CM

## 2023-11-06 LAB — CBC WITH DIFFERENTIAL/PLATELET
Abs Immature Granulocytes: 0.13 10*3/uL — ABNORMAL HIGH (ref 0.00–0.07)
Basophils Absolute: 0.1 10*3/uL (ref 0.0–0.1)
Basophils Relative: 1 %
Eosinophils Absolute: 0.2 10*3/uL (ref 0.0–0.5)
Eosinophils Relative: 1 %
HCT: 48.9 % (ref 39.0–52.0)
Hemoglobin: 16.3 g/dL (ref 13.0–17.0)
Immature Granulocytes: 1 %
Lymphocytes Relative: 40 %
Lymphs Abs: 5.1 10*3/uL — ABNORMAL HIGH (ref 0.7–4.0)
MCH: 30.8 pg (ref 26.0–34.0)
MCHC: 33.3 g/dL (ref 30.0–36.0)
MCV: 92.3 fL (ref 80.0–100.0)
Monocytes Absolute: 1.2 10*3/uL — ABNORMAL HIGH (ref 0.1–1.0)
Monocytes Relative: 10 %
Neutro Abs: 6 10*3/uL (ref 1.7–7.7)
Neutrophils Relative %: 47 %
Platelets: 306 10*3/uL (ref 150–400)
RBC: 5.3 MIL/uL (ref 4.22–5.81)
RDW: 13.2 % (ref 11.5–15.5)
WBC: 12.6 10*3/uL — ABNORMAL HIGH (ref 4.0–10.5)
nRBC: 0 % (ref 0.0–0.2)

## 2023-11-06 LAB — TROPONIN I (HIGH SENSITIVITY)
Troponin I (High Sensitivity): 8 ng/L (ref <18)
Troponin I (High Sensitivity): 70 ng/L — ABNORMAL HIGH (ref <18)
Troponin I (High Sensitivity): 82 ng/L — ABNORMAL HIGH (ref <18)
Troponin I (High Sensitivity): 70 ng/L — ABNORMAL HIGH (ref <18)

## 2023-11-06 LAB — COMPREHENSIVE METABOLIC PANEL
ALT: 17 U/L (ref 0–44)
AST: 23 U/L (ref 15–41)
Albumin: 4 g/dL (ref 3.5–5.0)
Alkaline Phosphatase: 83 U/L (ref 38–126)
Anion gap: 7 (ref 5–15)
BUN: 13 mg/dL (ref 6–20)
CO2: 25 mmol/L (ref 22–32)
Calcium: 8.4 mg/dL — ABNORMAL LOW (ref 8.9–10.3)
Chloride: 106 mmol/L (ref 98–111)
Creatinine, Ser: 0.55 mg/dL — ABNORMAL LOW (ref 0.61–1.24)
GFR, Estimated: >60 mL/min (ref >60)
Glucose, Bld: 182 mg/dL — ABNORMAL HIGH (ref 70–99)
Potassium: 3.6 mmol/L (ref 3.5–5.1)
Sodium: 138 mmol/L (ref 135–145)
Total Bilirubin: 0.5 mg/dL (ref 0.0–1.2)
Total Protein: 7.3 g/dL (ref 6.5–8.1)

## 2023-11-06 LAB — BLOOD GAS, ARTERIAL
Acid-Base Excess: 1.3 mmol/L (ref 0.0–2.0)
Bicarbonate: 27.7 mmol/L (ref 20.0–28.0)
FIO2: 100 %
MECHVT: 550 mL
Mechanical Rate: 20
O2 Saturation: 100 %
PEEP: 8 cmH2O
Patient temperature: 37
pCO2 arterial: 49 mmHg — ABNORMAL HIGH (ref 32–48)
pH, Arterial: 7.36 (ref 7.35–7.45)
pO2, Arterial: 117 mmHg — ABNORMAL HIGH (ref 83–108)

## 2023-11-06 LAB — RESP PANEL BY RT-PCR (RSV, FLU A&B, COVID)  RVPGX2
Influenza A by PCR: NEGATIVE
Influenza B by PCR: NEGATIVE
Resp Syncytial Virus by PCR: NEGATIVE
SARS Coronavirus 2 by RT PCR: NEGATIVE

## 2023-11-06 LAB — URINALYSIS, COMPLETE (UACMP) WITH MICROSCOPIC
Bilirubin Urine: NEGATIVE
Glucose, UA: 50 mg/dL — AB
Ketones, ur: NEGATIVE mg/dL
Leukocytes,Ua: NEGATIVE
Nitrite: NEGATIVE
Protein, ur: NEGATIVE mg/dL
Specific Gravity, Urine: 1.009 (ref 1.005–1.030)
Squamous Epithelial / HPF: 0 /HPF (ref 0–5)
pH: 6 (ref 5.0–8.0)

## 2023-11-06 LAB — PROTIME-INR
INR: 1 (ref 0.8–1.2)
Prothrombin Time: 13.5 s (ref 11.4–15.2)

## 2023-11-06 LAB — HEMOGLOBIN A1C
Hgb A1c MFr Bld: 5.4 % (ref 4.8–5.6)
Mean Plasma Glucose: 108.28 mg/dL

## 2023-11-06 LAB — HIV ANTIBODY (ROUTINE TESTING W REFLEX): HIV Screen 4th Generation wRfx: NONREACTIVE

## 2023-11-06 LAB — GLUCOSE, CAPILLARY
Glucose-Capillary: 157 mg/dL — ABNORMAL HIGH (ref 70–99)
Glucose-Capillary: 159 mg/dL — ABNORMAL HIGH (ref 70–99)
Glucose-Capillary: 160 mg/dL — ABNORMAL HIGH (ref 70–99)

## 2023-11-06 LAB — MRSA NEXT GEN BY PCR, NASAL: MRSA by PCR Next Gen: NOT DETECTED

## 2023-11-06 LAB — APTT: aPTT: 29 s (ref 24–36)

## 2023-11-06 MED ORDER — PROPOFOL 1000 MG/100ML IV EMUL
5.0000 ug/kg/min | INTRAVENOUS | Status: DC
  Administered 2023-11-06 (×2): 5 ug/kg/min via INTRAVENOUS
  Administered 2023-11-07 (×2): 15 ug/kg/min via INTRAVENOUS
  Administered 2023-11-08 – 2023-11-09 (×5): 25 ug/kg/min via INTRAVENOUS
  Filled 2023-11-06 (×10): qty 100

## 2023-11-06 MED ORDER — STROKE: EARLY STAGES OF RECOVERY BOOK: Freq: Once | Status: DC

## 2023-11-06 MED ORDER — DOCUSATE SODIUM 50 MG/5ML PO LIQD
100.0000 mg | Freq: Two times a day (BID) | ORAL | Status: DC
  Administered 2023-11-07 – 2023-11-08 (×4): 100 mg
  Filled 2023-11-06 (×4): qty 10

## 2023-11-06 MED ORDER — ROCURONIUM BROMIDE 10 MG/ML (PF) SYRINGE
PREFILLED_SYRINGE | INTRAVENOUS | Status: AC
  Filled 2023-11-06: qty 10

## 2023-11-06 MED ORDER — SUCCINYLCHOLINE CHLORIDE 200 MG/10ML IV SOSY
PREFILLED_SYRINGE | INTRAVENOUS | Status: AC
  Administered 2023-11-06: 120 mg via INTRAVENOUS
  Filled 2023-11-06: qty 10

## 2023-11-06 MED ORDER — ETOMIDATE 2 MG/ML IV SOLN
30.0000 mg | Freq: Once | INTRAVENOUS | Status: AC
  Administered 2023-11-06: 30 mg via INTRAVENOUS

## 2023-11-06 MED ORDER — POLYETHYLENE GLYCOL 3350 17 G PO PACK
17.0000 g | PACK | Freq: Every day | ORAL | Status: DC
  Administered 2023-11-07 – 2023-11-08 (×2): 17 g
  Filled 2023-11-06 (×2): qty 1

## 2023-11-06 MED ORDER — FENTANYL BOLUS VIA INFUSION
50.0000 ug | INTRAVENOUS | Status: DC | PRN
  Administered 2023-11-07 – 2023-11-08 (×3): 100 ug via INTRAVENOUS

## 2023-11-06 MED ORDER — LABETALOL HCL 5 MG/ML IV SOLN
20.0000 mg | Freq: Once | INTRAVENOUS | Status: AC
Start: 2023-11-06 — End: 2023-11-06

## 2023-11-06 MED ORDER — ACETAMINOPHEN 650 MG RE SUPP
650.0000 mg | RECTAL | Status: DC | PRN
  Administered 2023-11-06: 650 mg via RECTAL
  Filled 2023-11-06: qty 1

## 2023-11-06 MED ORDER — ETOMIDATE 2 MG/ML IV SOLN
INTRAVENOUS | Status: AC
  Filled 2023-11-06: qty 10

## 2023-11-06 MED ORDER — ORAL CARE MOUTH RINSE: 15.0000 mL | OROMUCOSAL | Status: DC | PRN

## 2023-11-06 MED ORDER — LORAZEPAM 2 MG/ML IJ SOLN: 2.0000 mg | Freq: Once | INTRAMUSCULAR | Status: AC

## 2023-11-06 MED ORDER — PANTOPRAZOLE SODIUM 40 MG IV SOLR
40.0000 mg | Freq: Every day | INTRAVENOUS | Status: DC
  Administered 2023-11-06: 40 mg via INTRAVENOUS
  Filled 2023-11-06: qty 10

## 2023-11-06 MED ORDER — CHLORHEXIDINE GLUCONATE CLOTH 2 % EX PADS: 6.0000 | MEDICATED_PAD | Freq: Every day | CUTANEOUS | Status: DC

## 2023-11-06 MED ORDER — INSULIN ASPART 100 UNIT/ML IJ SOLN
0.0000 [IU] | INTRAMUSCULAR | Status: DC
  Administered 2023-11-06 – 2023-11-07 (×4): 3 [IU] via SUBCUTANEOUS
  Administered 2023-11-07: 2 [IU] via SUBCUTANEOUS
  Administered 2023-11-07 (×4): 3 [IU] via SUBCUTANEOUS
  Administered 2023-11-08: 2 [IU] via SUBCUTANEOUS
  Administered 2023-11-08: 3 [IU] via SUBCUTANEOUS
  Administered 2023-11-08 (×2): 2 [IU] via SUBCUTANEOUS
  Filled 2023-11-06 (×13): qty 1

## 2023-11-06 MED ORDER — SENNOSIDES-DOCUSATE SODIUM 8.6-50 MG PO TABS
1.0000 | ORAL_TABLET | Freq: Two times a day (BID) | ORAL | Status: DC
Start: 2023-11-06 — End: 2023-11-10
  Administered 2023-11-06 – 2023-11-08 (×4): 1
  Filled 2023-11-06 (×5): qty 1

## 2023-11-06 MED ORDER — ACETAMINOPHEN 10 MG/ML IV SOLN
1000.0000 mg | Freq: Four times a day (QID) | INTRAVENOUS | Status: AC
  Administered 2023-11-07 (×4): 1000 mg via INTRAVENOUS
  Filled 2023-11-06 (×4): qty 100

## 2023-11-06 MED ORDER — LORAZEPAM 2 MG/ML IJ SOLN
INTRAMUSCULAR | Status: AC
  Administered 2023-11-06: 2 mg via INTRAVENOUS
  Filled 2023-11-06: qty 1

## 2023-11-06 MED ORDER — ACETAMINOPHEN 325 MG PO TABS: 650.0000 mg | ORAL_TABLET | ORAL | Status: DC | PRN

## 2023-11-06 MED ORDER — ORAL CARE MOUTH RINSE
15.0000 mL | OROMUCOSAL | Status: DC
  Administered 2023-11-06 – 2023-11-09 (×34): 15 mL via OROMUCOSAL

## 2023-11-06 MED ORDER — FENTANYL CITRATE (PF) 100 MCG/2ML IJ SOLN
100.0000 ug | Freq: Once | INTRAMUSCULAR | Status: AC
  Administered 2023-11-07: 100 ug via INTRAVENOUS

## 2023-11-06 MED ORDER — SUCCINYLCHOLINE CHLORIDE 200 MG/10ML IV SOSY: 120.0000 mg | PREFILLED_SYRINGE | Freq: Once | INTRAVENOUS | Status: AC

## 2023-11-06 MED ORDER — FENTANYL 2500MCG IN NS 250ML (10MCG/ML) PREMIX INFUSION
50.0000 ug/h | INTRAVENOUS | Status: DC
  Administered 2023-11-07: 50 ug/h via INTRAVENOUS
  Filled 2023-11-06: qty 250

## 2023-11-06 MED ORDER — NICARDIPINE HCL IN NACL 20-0.86 MG/200ML-% IV SOLN
3.0000 mg/h | INTRAVENOUS | Status: DC
  Administered 2023-11-06: 7.5 mg/h via INTRAVENOUS
  Administered 2023-11-06 (×2): 5 mg/h via INTRAVENOUS
  Administered 2023-11-07: 7.5 mg/h via INTRAVENOUS
  Administered 2023-11-07: 5 mg/h via INTRAVENOUS
  Administered 2023-11-07: 7.5 mg/h via INTRAVENOUS
  Administered 2023-11-07 (×2): 5 mg/h via INTRAVENOUS
  Administered 2023-11-07 (×3): 7.5 mg/h via INTRAVENOUS
  Administered 2023-11-08: 5 mg/h via INTRAVENOUS
  Administered 2023-11-08 (×7): 7.5 mg/h via INTRAVENOUS
  Administered 2023-11-09: 5 mg/h via INTRAVENOUS
  Administered 2023-11-09: 5.5 mg/h via INTRAVENOUS
  Filled 2023-11-06 (×20): qty 200

## 2023-11-06 MED ORDER — LABETALOL HCL 5 MG/ML IV SOLN
INTRAVENOUS | Status: AC
  Administered 2023-11-06: 20 mg via INTRAVENOUS
  Filled 2023-11-06: qty 4

## 2023-11-06 MED ORDER — ACETAMINOPHEN 160 MG/5ML PO SOLN: 650.0000 mg | ORAL | Status: DC | PRN

## 2023-11-06 NOTE — Plan of Care (Signed)
  Problem: Education: Goal: Knowledge of disease or condition will improve Outcome: Not Progressing Goal: Knowledge of secondary prevention will improve (MUST DOCUMENT ALL) Outcome: Not Progressing Goal: Knowledge of patient specific risk factors will improve (DELETE if not current risk factor) Outcome: Not Progressing

## 2023-11-06 NOTE — ED Notes (Signed)
 Verbal order from Dr. Lenard Lance, MD to get 30mg  of Etomidate and 120mg  of of Succinylcholine. This RN went to [employer] and overrode the incorrect dosage of medication. This RN went back to the pyxis and overrode the incorrect medication, this RN pulled Rocuronium instead. Reading the incorrect syringe of medication, this RN thought the syringe was (100mg /24mL) thinking we had to give a total of 10mL. RN administered a total of 200mg  of Succinylcholine and 20mg  of Rocuronium.   Dr. Greggory Stallion MD made aware. Herbert Seta, primary RN made aware. Pharmacy staff also made aware at this time. No further instructions from MD or Pharmacy at this time.

## 2023-11-06 NOTE — H&P (Addendum)
 NAME:  Roger Morton, MRN:  [id number], DOB:  03/04/1971, LOS: 0 ADMISSION DATE:  11/06/2023, CONSULTATION DATE: 11/06/2023 REFERRING MD: Dr. Lenard Lance, CHIEF COMPLAINT: Altered Mental Status   History of Present Illness:  This is a 53 yo male who presented to [facility] ER on 03/16 via EMS from [facility] with altered mental status.  Per ER notes EMS called out to [facility] for "unknown medical."   When EMS arrived on the scene pt noted to have snoring respirations and unresponsive to all stimuli.  Facility staff reported prior to pt becoming unresponsiveness pt c/o dizziness.  Pt placed on NRB and a nasopharyngeal airway prior to EMS transport.    ED Course  Upon arrival to the ER pt on a NRB with snoring respirations, unresponsive, with dilated/non reactive pupils.  Pt with known hx of seizure disorder, prior CVA with left-sided hemiparesis, and receives outpatient opioid medications.  EDP ordered 2 mg of iv ativan, following administration mentation remained unchanged.  Due to inability to protect airway pt mechanically intubated.  CT Head revealed massive left-sided subdural hematoma, intraparenchymal hematoma; retroclival hematoma causing ventral compression of the brainstem; and intraventricular hematoma.  Neurosurgeon Dr. [name] consulted and reviewed imaging and performed a physical examination.  Per Dr. [name] findings represented a terminal event/hemorrhage and surgical procedures would be futile.  He recommended continuing supportive care from a medical standpoint until further decisions regarding goals of care were discussed or pt progresses to formal diagnosis of brain death. Dr. [name] attempted to contact pts family multiple times utilizing phone numbers listed in epic and provided by [employer], however family unreachable.  PCCM team contacted for ICU admission.  Significant lab results: Glucose 182/creatinine 0.55/calcium 8.4/wbc 12.6  CXR: Well-positioned  endotracheal and nasal/orogastric tubes. No evidence of acute cardiopulmonary disease.  CT Head: 13 cc left occipital hematoma with intraventricular extension. Supra and infra tentorial subdural hematoma, most extensive over on the left cerebral convexity with thickness measuring up to 2 cm. 2 cm of midline shift with left uncal and subfalcine herniation. Entrapment of the right lateral ventricle.  Pertinent  Medical History  Anemia  Anxiety  BPH Chronic Pain  Depression  Essential HTN  Essential Tremor  GERD  Stroke with Residual Left Hemiplegia and Hemiparesis  Brain Tumor COVID-19 HLD  Osteoarthritis  Insomnia  Neuropathy  Overactive Bladder  Seizure Disorder   Significant Hospital Events: Including procedures, antibiotic start and stop dates in addition to other pertinent events   03/16: Admitted to ICU with terminal subdural and intraparenchymal hemorrhage requiring mechanical intubation for airway protection   Interim History / Subjective:  Pt mechanically intubated vent settings: FiO2 100%/PEEP 8.  Pt unresponsive/not breathing over the ventilator/gag reflexes present/no corneal reflexes.    Objective   Blood pressure 105/75, pulse 88, temperature 97.8 F (36.6 C), resp. rate 18, height 6\' 2"  (1.88 m), weight 101.9 kg, SpO2 98%.    Vent Mode: PRVC FiO2 (%):  [100 %] 100 % Set Rate:  [20 bmp] 20 bmp Vt Set:  [550 mL] 550 mL PEEP:  [8 cmH20] 8 cmH20  No intake or output data in the 24 hours ending 11/06/23 1147 Filed Weights   11/06/23 0902  Weight: 101.9 kg   Examination: General: Acute on chronically-ill appearing male, NAD mechanically intubated  HENT: Supple, no JVD  Lungs: Faint rhonchi, throughout, even, non labored  Cardiovascular: NSR, s1s2, no m/r/g, 2+ radial/1+ distal, 1+generalized edema  Abdomen: +BS x4, obese, soft, non distended  Extremities:  Not moving extremities  Neuro: Sedated, not following commands or withdrawing from painful stimulation,  pt irregular non reactive, no corneal reflexes present, absent cough but no gag reflex present  GU: Indwelling foley catheter draining yellow urine   Resolved Hospital Problem list     Assessment & Plan:   #Massive left-sided subdural hematoma, intraparenchymal hematoma, retroclival hematoma causing ventral compression of the brainstem, and intraventricular hematoma #Mechanical intubation pain/discomfort  Hx: Brain tumor, seizure disorder, stroke with residual left hemiplegia and hemiparesis, and essential tremor  - Neurosurgery consulted appreciate input: CT findings revealed terminal intracranial hemorrhage and surgical interventions deemed futile  - Neuro checks per NIH stroke scale protocol  - Avoid sedating medications as able  - Maintain RASS goal 0  - PAD to maintain RASS goal: Prn propofol  - Seizure precautions - EEG pending  - Nicardipine gtt to maintain sbp 140 or less   #Mildly elevated troponin secondary to demand ischemia  - Continuous telemetry monitoring  - Trend troponin until peaked  - Nicardipine gtt to maintain sbp 140 or less   #Acute respiratory failure #Mechanical intubation for airway protection due to catastrophic intracranial hemorrhage  - Full vent support for now: vent settings reviewed and established  - Continue lung protective strategies  - Maintain plateau pressures less than 30 cm H2O - Intermittent CXR and ABG's - VAP bundle implemented   #Leukocytosis without obvious signs of infection  - Trend WBC and monitor fever curve  - No indication for abx therapy at this time if pt becomes febrile will start empiric abx   #GERD - PPI  #Hyperglycemia - Hemoglobin A1c pending   - CBG's q4hrs  - SSI - Follow hyper/hypoglycemic protocol  - Target CBG range 140 to 180   Pt is critically ill with terminal intracranial hemorrhage and surgical procedures deemed futile by neurosurgery.  Pt with no chance on meaningful recovery.  Pt likely to progress to  formal diagnosis of brain death.  EDP, neurosurgery, and PCCM team attempted to reach pts family multiple times utilizing phone numbers in epic and provided by [employer], however family unreachable.  Recommend changing code status to DNR and proceeding with Comfort Measures Only. Consulted Transition of Care Team to provide assistance with locating pts family.   Best Practice (right click and "Reselect all SmartList Selections" daily)   Diet/type: NPO DVT prophylaxis SCD Pressure ulcer(s): N/A GI prophylaxis: PPI Lines: N/A Foley:  Yes, and it is still needed Code Status:  full code Last date of multidisciplinary goals of care discussion [N/A]  Labs   CBC: Recent Labs  Lab 11/06/23 0836  WBC 12.6*  NEUTROABS 6.0  HGB 16.3  HCT 48.9  MCV 92.3  PLT 306    Basic Metabolic Panel: Recent Labs  Lab 11/06/23 0836  NA 138  K 3.6  CL 106  CO2 25  GLUCOSE 182*  BUN 13  CREATININE 0.55*  CALCIUM 8.4*   GFR: Estimated Creatinine Clearance: 134.5 mL/min (A) (by C-G formula based on SCr of 0.55 mg/dL (L)). Recent Labs  Lab 11/06/23 0836  WBC 12.6*    Liver Function Tests: Recent Labs  Lab 11/06/23 0836  AST 23  ALT 17  ALKPHOS 83  BILITOT 0.5  PROT 7.3  ALBUMIN 4.0   No results for input(s): "LIPASE", "AMYLASE" in the last 168 hours. No results for input(s): "AMMONIA" in the last 168 hours.  ABG    Component Value Date/Time   PHART 7.36 11/06/2023 0908  PCO2ART 49 (H) 11/06/2023 0908   PO2ART 117 (H) 11/06/2023 0908   HCO3 27.7 11/06/2023 0908   O2SAT 100 11/06/2023 0908     Coagulation Profile: Recent Labs  Lab 11/06/23 0836  INR 1.0    Cardiac Enzymes: No results for input(s): "CKTOTAL", "CKMB", "CKMBINDEX", "TROPONINI" in the last 168 hours.  HbA1C: No results found for: "HGBA1C"  CBG: No results for input(s): "GLUCAP" in the last 168 hours.  Review of Systems:   Unable to assess pt mechanically intubated   Past Medical History:   He,  has a past medical history of Anemia, Anxiety, BPH (benign prostatic hyperplasia), Chronic pain, Depression, Dysphagia, Essential hypertension, Essential tremor, Facial paralysis on left side, GERD (gastroesophageal reflux disease), Hemiplegia and hemiparesis following cerebral infarction affecting left non-dominant side (HCC), History of brain tumor, History of COVID-19 (08/11/2022), Hyperlipidemia, Insomnia, Left leg weakness, Neuropathy, OA (osteoarthritis), OAB (overactive bladder), Seizure disorder (HCC), Sinus tachycardia, and Stroke (HCC).   Surgical History:   Past Surgical History:  Procedure Laterality Date   BRAIN SURGERY  2013   brain tumor excision   COLONOSCOPY WITH PROPOFOL N/A 08/11/2021   Procedure: COLONOSCOPY WITH PROPOFOL;  Surgeon: Midge Minium, MD;  Location: [facility] ENDOSCOPY;  Service: Endoscopy;  Laterality: N/A;   INTRAMEDULLARY (IM) NAIL INTERTROCHANTERIC Left 02/15/2021   Procedure: INTRAMEDULLARY (IM) NAIL INTERTROCHANTRIC;  Surgeon: Durene Romans, MD;  Location: [facility] OR;  Service: Orthopedics;  Laterality: Left;   REVERSE SHOULDER ARTHROPLASTY Right 10/29/2022   Procedure: REVERSE SHOULDER ARTHROPLASTY;  Surgeon: Beverely Low, MD;  Location: [facility] ORS;  Service: Orthopedics;  Laterality: Right;   REVERSE SHOULDER ARTHROPLASTY Left 05/13/2023   Procedure: REVERSE SHOULDER ARTHROPLASTY;  Surgeon: Beverely Low, MD;  Location: [facility] ORS;  Service: Orthopedics;  Laterality: Left;   shunt intracranial x2       Social History:   reports that he has been smoking cigarettes. He has a 25 pack-year smoking history. He has never used smokeless tobacco. He reports that he does not currently use alcohol. He reports that he does not currently use drugs.   Family History:  His family history is negative for CAD.   Allergies Allergies  Allergen Reactions   Penicillins     Per MAR      Home Medications  Prior to Admission medications   Medication Sig Start Date End Date  Taking? Authorizing Provider  acetaminophen (TYLENOL) 325 MG tablet Take 650 mg by mouth 2 (two) times daily. May take an additional 650 mg if needed every 4 hours for pain    [provider]  aspirin 81 MG chewable tablet Chew 81 mg by mouth daily.    [provider]  atorvastatin (LIPITOR) 40 MG tablet Take 40 mg by mouth daily at 4 PM.    [provider]  azelastine (ASTELIN) 0.1 % nasal spray Place 2 sprays into both nostrils 2 (two) times daily. Use in each nostril as directed    [provider]  baclofen (LIORESAL) 20 MG tablet Take 20 mg by mouth in the morning, at noon, and at bedtime. 05/20/21   [provider]  busPIRone (BUSPAR) 10 MG tablet Take 10 mg by mouth 3 (three) times daily.    [provider]  carboxymethylcellulose (ARTIFICIAL TEARS) 1 % ophthalmic solution Place 1 drop into both eyes in the morning, at noon, in the evening, and at bedtime.    [provider]  carboxymethylcellulose 1 % ophthalmic solution Place 1 drop into the left eye at  bedtime. Refresh liquigel    [provider]  celecoxib (CELEBREX) 200 MG capsule Take 1 capsule (200 mg total) by mouth daily. 05/14/21   Heather Roberts, NP  clonazePAM (KLONOPIN) 0.25 MG disintegrating tablet Take 0.25 mg by mouth 2 (two) times daily. 04/13/23   [provider]  diclofenac Sodium (VOLTAREN) 1 % GEL Apply 1 g topically 3 (three) times daily. Apply to left hips topically    [provider]  fluticasone (FLONASE) 50 MCG/ACT nasal spray Place 1 spray into both nostrils daily. 05/20/21   [provider]  gabapentin (NEURONTIN) 300 MG capsule Take 300-900 mg by mouth See admin instructions. Take 300 mg in the morning and 900 mg at bedtime    [provider]  ibuprofen (ADVIL) 400 MG tablet Take 400 mg by mouth every 8 (eight) hours as needed (Back pain).    [provider]  Lidocaine 4 % PTCH Apply 1 patch topically  daily. Apply to right shoulder for 12 hours max dose =3 patches remove per schedule 05/11/21   [provider]  melatonin 5 MG TABS Take 5 mg by mouth at bedtime.    [provider]  mirabegron ER (MYRBETRIQ) 25 MG TB24 tablet Take 1 tablet (25 mg total) by mouth daily. 06/30/22   Stoioff, Verna Czech, MD  nortriptyline (PAMELOR) 10 MG capsule Take 10-50 mg by mouth See admin instructions. Take 10 mg in the morning and 50 ng at bedtime    [provider]  Omega-3 Fatty Acids (FISH OIL) 1000 MG CAPS Take 1,000 mg by mouth daily.    [provider]  oxycodone (OXY-IR) 5 MG capsule Take 1 capsule (5 mg total) by mouth every 6 (six) hours as needed for pain. 05/13/23   Beverely Low, MD  pantoprazole (PROTONIX) 20 MG tablet Take 20 mg by mouth every morning. 0700    [provider]  Polyethyl Glycol-Propyl Glycol (SYSTANE) 0.4-0.3 % SOLN Place 1 drop into both eyes at bedtime. There is a 2nd order in [facility] stating "Instill 1 drop into left eye at bedtime for exposure keratitis"    [provider]  propranolol (INDERAL) 10 MG tablet Take 10 mg by mouth 2 (two) times daily.    [provider]  QUEtiapine (SEROQUEL) 200 MG tablet Take 200 mg by mouth at bedtime. 07/29/22   [provider]  senna-docusate (SENOKOT-S) 8.6-50 MG tablet Take 2 tablets by mouth at bedtime. 02/18/21   Marguerita Merles Latif, DO  tamsulosin (FLOMAX) 0.4 MG CAPS capsule Take 0.4 mg by mouth daily. 08/20/20   [provider]     Critical care time: 70 minutes      Zada Girt, AGNP  Pulmonary/Critical Care Pager [phone] (please enter 7 digits) PCCM Consult Pager [phone] (please enter 7 digits)

## 2023-11-06 NOTE — ED Notes (Signed)
 XR at bedside

## 2023-11-06 NOTE — ED Provider Notes (Signed)
 [facility] Provider Note    Event Date/Time   First MD Initiated Contact with Patient 11/06/23 [id number]     (approximate)  History   Chief Complaint: Altered Mental Status  HPI  Roger Morton is a 53 y.o. male with a past medical history of anxiety, hypertension, gastric reflux, CVA with left-sided hemiparesis, history of a prior brain tumor, seizure disorder, presents to the emergency department for unresponsiveness.  According to report from EMS they were called to the patient's living facility [facility] for an unknown medical emergency.  Upon arrival they found the patient with agonal respirations, unresponsive to stimuli.  They transported the patient with a nonrebreather and a nasopharyngeal airway.  Upon arrival to the emergency department patient is nonresponsive, pupils are 4 to 5 mm bilaterally and nonreactive.  Patient is unresponsive to painful stimuli.  No posturing.  Physical Exam   Triage Vital Signs: ED Triage Vitals [11/06/23 0829]  Encounter Vitals Group     BP (!) 180/96     Systolic BP Percentile      Diastolic BP Percentile      Pulse Rate 86     Resp 17     Temp      Temp src      SpO2 (!) 89 %     Weight      Height      Head Circumference      Peak Flow      Pain Score      Pain Loc      Pain Education      Exclude from Growth Chart     Most recent vital signs: Vitals:   11/06/23 0829 11/06/23 0837  BP: (!) 180/96   Pulse: 86   Resp: 17   SpO2: (!) 89% (!) 89%    General: Patient is unresponsive, agonal respirations.  Fixed and dilated pupils.  Patient appears to have a possible right sided VP shunt on exam.  No obvious signs of head trauma. CV:  Good peripheral perfusion.  Regular rate and rhythm  Resp:  Agonal respirations.  Equal breath sounds bilaterally. Abd:  No distention.  Soft  ED Results / Procedures / Treatments   EKG  EKG viewed and interpreted by myself shows sinus arrhythmia at 81 bpm with  a narrow QRS, normal axis, normal intervals, no concerning ST changes.  RADIOLOGY  I reviewed the CT images.  Patient appears to have a large head bleed on my evaluation of the images.  There appears to be a left subdural as well as left intraventricular component possible parenchymal component.  Patient has a VP shunt in place, significant midline shift. Radiology confirms 13 cc left occipital hematoma with intraventricular extension subdural hematoma 2 cm of midline shift.  MEDICATIONS ORDERED IN ED: Medications  etomidate (AMIDATE) 2 MG/ML injection (has no administration in time range)  rocuronium (ZEMURON) 100 MG/10ML injection (has no administration in time range)  etomidate (AMIDATE) 2 MG/ML injection (has no administration in time range)  LORazepam (ATIVAN) injection 2 mg (2 mg Intravenous Given 11/06/23 0832)  etomidate (AMIDATE) injection 30 mg (30 mg Intravenous Given 11/06/23 0836)  succinylcholine (ANECTINE) syringe 120 mg (120 mg Intravenous Given 11/06/23 0837)     IMPRESSION / MDM / ASSESSMENT AND PLAN / ED COURSE  I reviewed the triage vital signs and the nursing notes.  Patient's presentation is most consistent with acute presentation with potential threat to life or bodily function.  Patient  presents the emergency department for unresponsiveness from his nursing facility  healthcare.  Per record review patient has a history of seizure disorder also prior CVA with left-sided hemiparesis, also on opioid medications.  Here patient is unresponsive, no response to painful stimuli, agonal respirations with fixed and dilated pupils.  Patient given 2 mg of IV Ativan, no change.  As the patient is not protecting his airway I intubated the patient.  We will obtain a stat head CT.  Prior to intubation attempted to contact the patient's emergency contact multiple times, no answer with no voicemail set up to leave a message.  Attempted to call  healthcare to obtain any  advanced directives, no answer from the nursing facility either.  Given the emergency nature of his presentation decision was made by myself to intubate. Differential is quite broad at this time, my concern would be for a significant head bleed or CVA.  Less likely to be status epilepticus.  I have reviewed CT imaging.  Patient appears to have a significant head bleed on my evaluation which appears to be to involve the ventricle as well as a left subdural with significant midline shift.  I spoke to neurosurgery who will be in shortly to see the patient.  Attempted to contact family again with no ability.  Patient is hypertensive 214/120 we will dose 20 mg of IV labetalol and then start the patient on a nicardipine drip as needed to maintain SBP below 140.  Attempted to call all emergency contact/family phone number listed in the patient's chart.  Have been unable to reach any family member.  Lab work has resulted showing reassuring CBC, reassuring chemistry, negative troponin.  ABG shows no significant finding, we will titrate O2 as needed.  Neurosurgery was able to get a hold of the nursing home and obtained family phone numbers, he also states he has been unable to reach any family member.  Neurosurgery states there is no surgical option for this patient due to futility.  We will admit to the ICU until they are able to reach family as the patient will likely need comfort care measures following this.  Blood pressure is decreasing on nicardipine drip currently 142/98 we will continue to titrate as needed.   CRITICAL CARE Performed by: Minna Antis   Total critical care time: 60 minutes  Critical care time was exclusive of separately billable procedures and treating other patients.  Critical care was necessary to treat or prevent imminent or life-threatening deterioration.  Critical care was time spent personally by me on the following activities: development of treatment plan with patient  and/or surrogate as well as nursing, discussions with consultants, evaluation of patient's response to treatment, examination of patient, obtaining history from patient or surrogate, ordering and performing treatments and interventions, ordering and review of laboratory studies, ordering and review of radiographic studies, pulse oximetry and re-evaluation of patient's condition.   FINAL CLINICAL IMPRESSION(S) / ED DIAGNOSES   Unresponsiveness Intracranial hemorrhage  Note:  This document was prepared using Dragon voice recognition software and may include unintentional dictation errors.   Minna Antis, MD 11/06/23 1009

## 2023-11-06 NOTE — ED Notes (Signed)
 Patient transported to CT

## 2023-11-06 NOTE — Consult Note (Signed)
 Consulting Department:  Emergency department  Primary Physician:  Aldean Baker, FNP  Chief Complaint: Subdural and intraparenchymal hemorrhage  History of Present Illness: 11/06/2023 Roger Morton is a 53 y.o. male who presents with the chief complaint of decreased level of consciousness.  He was brought into the emergency department after being found unresponsive with fixed and dilated pupils.  He was brought into the resuscitation bay and was evaluated, given his lack of reactivity and agonal breathing and a intubation was performed.  He was then taken emergently to CT scan which demonstrated a massive left-sided subdural hematoma, intraparenchymal hematoma, retroclival hematoma, intraventricular hematoma.   Has a history of a left-sided vestibular schwannoma, history of a left-sided hemiplegia secondary to a nondominant CVA.  Review of Systems:  A 10 point review of systems is negative, except for the pertinent positives and negatives detailed in the HPI.  Past Medical History: Past Medical History:  Diagnosis Date   Anemia    Anxiety    BPH (benign prostatic hyperplasia)    Chronic pain    Depression    Dysphagia    Essential hypertension    Essential tremor    Facial paralysis on left side    GERD (gastroesophageal reflux disease)    Hemiplegia and hemiparesis following cerebral infarction affecting left non-dominant side (HCC)    History of brain tumor    History of COVID-19 08/11/2022   Hyperlipidemia    Insomnia    Left leg weakness    S/P stroke   Neuropathy    OA (osteoarthritis)    OAB (overactive bladder)    Seizure disorder (HCC)    Sinus tachycardia    Stroke [facility])    had brain tumor, and had CVAs during operation for his brain tumor    Past Surgical History: Past Surgical History:  Procedure Laterality Date   BRAIN SURGERY  2013   brain tumor excision   COLONOSCOPY WITH PROPOFOL N/A 08/11/2021   Procedure: COLONOSCOPY WITH PROPOFOL;   Surgeon: Midge Minium, MD;  Location: [facility];  Service: Endoscopy;  Laterality: N/A;   INTRAMEDULLARY (IM) NAIL INTERTROCHANTERIC Left 02/15/2021   Procedure: INTRAMEDULLARY (IM) NAIL INTERTROCHANTRIC;  Surgeon: Durene Romans, MD;  Location: [facility] OR;  Service: Orthopedics;  Laterality: Left;   REVERSE SHOULDER ARTHROPLASTY Right 10/29/2022   Procedure: REVERSE SHOULDER ARTHROPLASTY;  Surgeon: Beverely Low, MD;  Location: [facility] ORS;  Service: Orthopedics;  Laterality: Right;   REVERSE SHOULDER ARTHROPLASTY Left 05/13/2023   Procedure: REVERSE SHOULDER ARTHROPLASTY;  Surgeon: Beverely Low, MD;  Location: [facility] ORS;  Service: Orthopedics;  Laterality: Left;   shunt intracranial x2      Allergies: Allergies as of 11/06/2023 - Review Complete 11/06/2023  Allergen Reaction Noted   Penicillins  01/14/2021    Medications:  Current Facility-Administered Medications:    etomidate (AMIDATE) 2 MG/ML injection, , , ,    nicardipine (CARDENE) 20mg  in 0.86% saline IV infusion (0.1 mg/ml), 3-15 mg/hr, Intravenous, Continuous, Paduchowski, Caryn Bee, MD, Last Rate: 125 mL/hr at 11/06/23 0937, 12.5 mg/hr at 11/06/23 0937   propofol (DIPRIVAN) 1000 MG/100ML infusion, 5-80 mcg/kg/min, Intravenous, Continuous, Minna Antis, MD, Last Rate: 9.17 mL/hr at 11/06/23 0920, 15 mcg/kg/min at 11/06/23 0920   rocuronium (ZEMURON) 100 MG/10ML injection, , , ,   Current Outpatient Medications:    acetaminophen (TYLENOL) 325 MG tablet, Take 650 mg by mouth 2 (two) times daily. May take an additional 650 mg if needed every 4 hours for pain, Disp: , Rfl:  aspirin 81 MG chewable tablet, Chew 81 mg by mouth daily., Disp: , Rfl:    atorvastatin (LIPITOR) 40 MG tablet, Take 40 mg by mouth daily at 4 PM., Disp: , Rfl:    azelastine (ASTELIN) 0.1 % nasal spray, Place 2 sprays into both nostrils 2 (two) times daily. Use in each nostril as directed, Disp: , Rfl:    baclofen (LIORESAL) 20 MG tablet, Take 20 mg by mouth in  the morning, at noon, and at bedtime., Disp: , Rfl:    busPIRone (BUSPAR) 10 MG tablet, Take 10 mg by mouth 3 (three) times daily., Disp: , Rfl:    carboxymethylcellulose (ARTIFICIAL TEARS) 1 % ophthalmic solution, Place 1 drop into both eyes in the morning, at noon, in the evening, and at bedtime., Disp: , Rfl:    carboxymethylcellulose 1 % ophthalmic solution, Place 1 drop into the left eye at bedtime. Refresh liquigel, Disp: , Rfl:    celecoxib (CELEBREX) 200 MG capsule, Take 1 capsule (200 mg total) by mouth daily., Disp: 30 capsule, Rfl: 3   clonazePAM (KLONOPIN) 0.25 MG disintegrating tablet, Take 0.25 mg by mouth 2 (two) times daily., Disp: , Rfl:    diclofenac Sodium (VOLTAREN) 1 % GEL, Apply 1 g topically 3 (three) times daily. Apply to left hips topically, Disp: , Rfl:    fluticasone (FLONASE) 50 MCG/ACT nasal spray, Place 1 spray into both nostrils daily., Disp: , Rfl:    gabapentin (NEURONTIN) 300 MG capsule, Take 300-900 mg by mouth See admin instructions. Take 300 mg in the morning and 900 mg at bedtime, Disp: , Rfl:    ibuprofen (ADVIL) 400 MG tablet, Take 400 mg by mouth every 8 (eight) hours as needed (Back pain)., Disp: , Rfl:    Lidocaine 4 % PTCH, Apply 1 patch topically daily. Apply to right shoulder for 12 hours max dose =3 patches remove per schedule, Disp: , Rfl:    melatonin 5 MG TABS, Take 5 mg by mouth at bedtime., Disp: , Rfl:    mirabegron ER (MYRBETRIQ) 25 MG TB24 tablet, Take 1 tablet (25 mg total) by mouth daily., Disp: 28 tablet, Rfl: 0   nortriptyline (PAMELOR) 10 MG capsule, Take 10-50 mg by mouth See admin instructions. Take 10 mg in the morning and 50 ng at bedtime, Disp: , Rfl:    Omega-3 Fatty Acids (FISH OIL) 1000 MG CAPS, Take 1,000 mg by mouth daily., Disp: , Rfl:    oxycodone (OXY-IR) 5 MG capsule, Take 1 capsule (5 mg total) by mouth every 6 (six) hours as needed for pain., Disp: 30 capsule, Rfl: 0   pantoprazole (PROTONIX) 20 MG tablet, Take 20 mg by mouth  every morning. 0700, Disp: , Rfl:    Polyethyl Glycol-Propyl Glycol (SYSTANE) 0.4-0.3 % SOLN, Place 1 drop into both eyes at bedtime. There is a 2nd order in [facility] stating "Instill 1 drop into left eye at bedtime for exposure keratitis", Disp: , Rfl:    propranolol (INDERAL) 10 MG tablet, Take 10 mg by mouth 2 (two) times daily., Disp: , Rfl:    QUEtiapine (SEROQUEL) 200 MG tablet, Take 200 mg by mouth at bedtime., Disp: , Rfl:    senna-docusate (SENOKOT-S) 8.6-50 MG tablet, Take 2 tablets by mouth at bedtime., Disp: 60 tablet, Rfl: 0   tamsulosin (FLOMAX) 0.4 MG CAPS capsule, Take 0.4 mg by mouth daily., Disp: , Rfl:    Social History: Social History   Tobacco Use   Smoking status: Every Day  Current packs/day: 1.00    Average packs/day: 1 pack/day for 25.0 years (25.0 ttl pk-yrs)    Types: Cigarettes   Smokeless tobacco: Never  Vaping Use   Vaping status: Former   Substances: Nicotine  Substance Use Topics   Alcohol use: Not Currently   Drug use: Not Currently    Family Medical History: Family History  Problem Relation Age of Onset   CAD Neg Hx     Physical Examination: Vitals:   11/06/23 0936 11/06/23 0945  BP: (!) 159/112 (!) 142/98  Pulse:  92  Resp:  15  Temp:  97.8 F (36.6 C)  SpO2:  99%     General: Patient is well developed, well nourished, calm, collected, and in no apparent distress.  NEUROLOGICAL:  General: Intubated. Cranial nerve examination: Fixed dilated pupils, unresponsive to light reflex, absent doll's eyes reflex, no cough gag or corneal reflex noted.  GCS: 3T  Imaging: CT Head Wo Contrast Result Date: 11/06/2023 CLINICAL DATA:  Altered mental status EXAM: CT HEAD WITHOUT CONTRAST TECHNIQUE: Contiguous axial images were obtained from the base of the skull through the vertex without intravenous contrast. RADIATION DOSE REDUCTION: This exam was performed according to the departmental dose-optimization program which includes automated exposure  control, adjustment of the mA and/or kV according to patient size and/or use of iterative reconstruction technique. COMPARISON:  01/14/2021 FINDINGS: Brain: Acute hematoma in the left occipital lobe measuring 38 x 18 x 37 mm. There is intraventricular extension with blood clot in the lateral ventricles. Medially there is contact with the inter hemispheric fissure, presumably the source of a large and generalized subdural hematoma along the left cerebral convexity which measures up to 19 mm laterally on the left. Thinner subdural hematoma also involves the inter hemispheric fissure, right tentorium, and retro clival locations. Pronounced midline shift measuring 2 cm with asymmetric dilatation/entrapment of the right lateral ventricle. 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 VP shunt from a right frontal approach, tip near but not immediately at the level of parenchymal hemorrhage, coarse distorted by the brain shift. Vascular: No hyperdense vessel or unexpected calcification. Skull: Left retromastoid craniotomy. Bulging of meninges through the defect which may have increased in the setting of elevated intracranial pressure. Sinuses/Orbits: Negative Other: Critical Value/emergent results were called by telephone at the time of interpretation on 11/06/2023 at 9:10 am to provider [facility] , who was already aware of the findings. IMPRESSION: 13 cc left occipital hematoma with intraventricular extension. Supra and infra tentorial subdural hematoma, most extensive over on the left cerebral convexity with thickness measuring up to 2 cm. 2 cm of midline shift with left uncal and subfalcine herniation. Entrapment of the right lateral ventricle. Electronically Signed   By: Tiburcio Pea M.D.   On: 11/06/2023 09:21   DG Chest Portable 1 View Result Date: 11/06/2023 CLINICAL DATA:  Intubation. EXAM: PORTABLE CHEST 1 VIEW COMPARISON:  09/20/2020. FINDINGS: Endotracheal  tube tip projects 5.1 cm above the carina. Nasal/orogastric tube passes below the diaphragm, tip in the mid stomach. Cardiac silhouette is normal in size. No mediastinal or hilar masses. Lungs demonstrate thickened interstitial markings that are chronic, as well as chronic apical apical left lung base scarring. No lung consolidation to suggest pneumonia. No evidence of edema. No pleural effusion or gross pneumothorax on this supine study. IMPRESSION: 1. Well-positioned endotracheal and nasal/orogastric tubes. 2. No evidence of acute cardiopulmonary disease. Electronically Signed   By: Renard Hamper.D.  On: 11/06/2023 08:58     I have personally reviewed the images and agree with the above interpretation.  In addition I feel that there is likely a evidence of brainstem hemorrhage Duret hemorrhage, I will insert the index image below.  He appears to have a trapped "fifth ventricle" in the area of his previous craniotomy, significant hypodensity noted in the brainstem.   Labs:    Latest Ref Rng & Units 11/06/2023    8:36 AM 05/13/2023    8:40 AM 10/30/2022    3:57 AM  CBC  WBC 4.0 - 10.5 K/uL 12.6  9.9    Hemoglobin 13.0 - 17.0 g/dL 78.2  95.6  21.3   Hematocrit 39.0 - 52.0 % 48.9  46.7  37.9   Platelets 150 - 400 K/uL 306  243        Latest Ref Rng & Units 11/06/2023    8:36 AM 05/13/2023    8:40 AM 10/30/2022    3:57 AM  BMP  Glucose 70 - 99 mg/dL 086  97  578   BUN 6 - 20 mg/dL 13  18  14    Creatinine 0.61 - 1.24 mg/dL 4.69  6.29  5.28   Sodium 135 - 145 mmol/L 138  137  138   Potassium 3.5 - 5.1 mmol/L 3.6  3.8  3.9   Chloride 98 - 111 mmol/L 106  105  104   CO2 22 - 32 mmol/L 25  24  25    Calcium 8.9 - 10.3 mg/dL 8.4  9.3  8.6     INR  1.0 (03/16 0836)  CBC, BMP, and INR all reviewed.  No evidence of current coagulopathy, platelets are within normal limits, sodium within normal limits.  Assessment and Plan: Mr. Dalziel is a pleasant 53 y.o. 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  He has been noted to have worsening headaches and worsening spasticity and a outpatient MRI was ordered for evaluation of any progression of his tumor.  Unfortunately he presents today unresponsive, fixed dilated pupils, no brainstem reflexes, he was intubated for airway protection.  He was demonstrating Cushing's phenomenon with hypertensive bradycardia and agonal breathing.  He shows no responsiveness and is extremities neither on arrival or during transport.  I reviewed his CT scan and his labs above.  In summary his CT scan shows a massive subdural hematoma with severe left to right midline shift.  He also shows intraparenchymal hemorrhage in the occipital lobe with extension into the ventricular space.  Also has retroclival hematoma causing ventral compression of the brainstem.  CSF space in the left mastoid location likely represents a trapped CSF system with increased ICP.  There is 2 cm of thickness at its apex, and at least 2 cm of midline shift.  Evidence of both uncus and transtentorial herniation.  The hypodensity in the brainstem also demonstrates a area of hyperdensity which I feel like is possibly consistent with a Duret hemorrhage. with his physical examination and imaging, this represents a terminal event/hemorrhage.  He has no activity or brainstem reflexes currently, in order to fully evacuate his hematoma he would need both a left-sided hemicraniectomy as well as as a suboccipital craniotomy to decompress his brainstem.  This procedure would be futile medically, I do not feel he would benefit from a neurosurgical intervention at this time.  I attempted to  reach his family multiple times, was able to reach his nurse at the nursing home and got additional phone numbers which were also unreachable.  At this time I would  not recommend a neurosurgical intervention.  Can continue to support him from a medical standpoint until further decision is made or if he progresses to formal diagnosis of brain death.  Lovenia Kim, MD/MSCR Dept. of Neurosurgery

## 2023-11-06 NOTE — ED Triage Notes (Signed)
 Pt arrives via [facility] from [facility] for AMS. EMS called out for "unknown medical." Per their report, pt with snoring respirations and not responsive to any stimuli. Facility staff reported the pt stated he felt dizzy prior to symptom onset. Pt arrives on [facility] with snoring respirations, dilated and non-reactive pupils.

## 2023-11-06 NOTE — IPAL (Signed)
  Interdisciplinary Goals of Care Family Meeting   Date carried out: 11/06/2023  Location of the meeting: Phone conference  Member's involved: Nurse Practitioner, Bedside Registered Nurse, and Family Member or next of kin  Durable Power of Attorney or acting medical decision maker: Pt brother [name]    Discussion: We discussed goals of care for Roger Morton .  Informed pts brother pt is critically ill with terminal intracranial hemorrhage and surgical procedures deemed futile by neurosurgery.  Pt with no chance on meaningful recovery. Following goals of care discussions pts brother decided to change pts code status to DO NOT RESUSCITATE.  He stated he and his wife will fly in from [place] and arrive at pts bedside tomorrow morning and will likely transition pt to Comfort Measures Only   Code status:   Code Status: Limited: Do not attempt resuscitation (DNR) -DNR-LIMITED -Do Not Intubate/DNI    Disposition: Continue current acute care  Time spent for the meeting: 15 minutes   Zada Girt, AGNP  Pulmonary/Critical Care Pager [phone] (please enter 7 digits) PCCM Consult Pager [phone] (please enter 7 digits)

## 2023-11-07 ENCOUNTER — Inpatient Hospital Stay

## 2023-11-07 ENCOUNTER — Inpatient Hospital Stay (HOSPITAL_COMMUNITY)
Admit: 2023-11-07 | Discharge: 2023-11-07 | Disposition: A | Discharge status: Home / Self Care | Attending: Nurse Practitioner | Admitting: Nurse Practitioner

## 2023-11-07 DIAGNOSIS — R569 Unspecified convulsions: Secondary | ICD-10-CM

## 2023-11-07 DIAGNOSIS — R509 Fever, unspecified: Secondary | ICD-10-CM

## 2023-11-07 DIAGNOSIS — I629 Nontraumatic intracranial hemorrhage, unspecified: Secondary | ICD-10-CM | POA: Diagnosis not present

## 2023-11-07 DIAGNOSIS — R4182 Altered mental status, unspecified: Secondary | ICD-10-CM

## 2023-11-07 DIAGNOSIS — R000 Tachycardia, unspecified: Secondary | ICD-10-CM | POA: Diagnosis not present

## 2023-11-07 LAB — FIBRINOGEN
Fibrinogen: 564 mg/dL — ABNORMAL HIGH (ref 210–475)
Fibrinogen: 608 mg/dL — ABNORMAL HIGH (ref 210–475)
Fibrinogen: 705 mg/dL — ABNORMAL HIGH (ref 210–475)
Fibrinogen: 787 mg/dL — ABNORMAL HIGH (ref 210–475)

## 2023-11-07 LAB — BLOOD GAS, ARTERIAL
Acid-Base Excess: 1 mmol/L (ref 0.0–2.0)
Acid-Base Excess: 1.2 mmol/L (ref 0.0–2.0)
Acid-Base Excess: 3.9 mmol/L — ABNORMAL HIGH (ref 0.0–2.0)
Acid-base deficit: 0.1 mmol/L (ref 0.0–2.0)
Bicarbonate: 23.9 mmol/L (ref 20.0–28.0)
Bicarbonate: 26 mmol/L (ref 20.0–28.0)
Bicarbonate: 25.1 mmol/L (ref 20.0–28.0)
Bicarbonate: 27.7 mmol/L (ref 20.0–28.0)
FIO2: 50 %
FIO2: 0.5 %
FIO2: 100 %
FIO2: 50 %
MECHVT: 550 mL
MECHVT: 550 mL
MECHVT: 550 mL
MECHVT: 550 mL
Mechanical Rate: 20
Mechanical Rate: 30
O2 Saturation: 98.3 %
O2 Saturation: 100 %
O2 Saturation: 100 %
O2 Saturation: 100 %
PEEP: 8 cmH2O
PEEP: 8 cmH2O
PEEP: 5 cmH2O
PEEP: 5 cmH2O
Patient temperature: 38.2
Patient temperature: 37
Patient temperature: 37
Patient temperature: 37
RATE: 20 {breaths}/min
RATE: 20 {breaths}/min
RATE: 20 {breaths}/min
RATE: 30 {breaths}/min
pCO2 arterial: 38 mmHg (ref 32–48)
pCO2 arterial: 42 mmHg (ref 32–48)
pCO2 arterial: 37 mmHg (ref 32–48)
pCO2 arterial: 38 mmHg (ref 32–48)
pH, Arterial: 7.41 (ref 7.35–7.45)
pH, Arterial: 7.4 (ref 7.35–7.45)
pH, Arterial: 7.44 (ref 7.35–7.45)
pH, Arterial: 7.47 — ABNORMAL HIGH (ref 7.35–7.45)
pO2, Arterial: 78 mmHg — ABNORMAL LOW (ref 83–108)
pO2, Arterial: 103 mmHg (ref 83–108)
pO2, Arterial: 341 mmHg — ABNORMAL HIGH (ref 83–108)
pO2, Arterial: 86 mmHg (ref 83–108)

## 2023-11-07 LAB — CBC
HCT: 42.3 % (ref 39.0–52.0)
HCT: 43.1 % (ref 39.0–52.0)
HCT: 45.6 % (ref 39.0–52.0)
HCT: 46.8 % (ref 39.0–52.0)
Hemoglobin: 14.5 g/dL (ref 13.0–17.0)
Hemoglobin: 14.8 g/dL (ref 13.0–17.0)
Hemoglobin: 15.3 g/dL (ref 13.0–17.0)
Hemoglobin: 15.8 g/dL (ref 13.0–17.0)
MCH: 30.3 pg (ref 26.0–34.0)
MCH: 30.5 pg (ref 26.0–34.0)
MCH: 30.9 pg (ref 26.0–34.0)
MCH: 31.3 pg (ref 26.0–34.0)
MCHC: 33.6 g/dL (ref 30.0–36.0)
MCHC: 33.8 g/dL (ref 30.0–36.0)
MCHC: 34.3 g/dL (ref 30.0–36.0)
MCHC: 34.3 g/dL (ref 30.0–36.0)
MCV: 88.9 fL (ref 80.0–100.0)
MCV: 90.3 fL (ref 80.0–100.0)
MCV: 91.4 fL (ref 80.0–100.0)
MCV: 91.6 fL (ref 80.0–100.0)
Platelets: 241 10*3/uL (ref 150–400)
Platelets: 243 10*3/uL (ref 150–400)
Platelets: 245 10*3/uL (ref 150–400)
Platelets: 262 10*3/uL (ref 150–400)
RBC: 4.63 MIL/uL (ref 4.22–5.81)
RBC: 4.85 MIL/uL (ref 4.22–5.81)
RBC: 5.05 MIL/uL (ref 4.22–5.81)
RBC: 5.11 MIL/uL (ref 4.22–5.81)
RDW: 13.2 % (ref 11.5–15.5)
RDW: 13.2 % (ref 11.5–15.5)
RDW: 13.2 % (ref 11.5–15.5)
RDW: 13.2 % (ref 11.5–15.5)
WBC: 20.2 10*3/uL — ABNORMAL HIGH (ref 4.0–10.5)
WBC: 22.1 10*3/uL — ABNORMAL HIGH (ref 4.0–10.5)
WBC: 22.8 10*3/uL — ABNORMAL HIGH (ref 4.0–10.5)
WBC: 23 10*3/uL — ABNORMAL HIGH (ref 4.0–10.5)
nRBC: 0 % (ref 0.0–0.2)
nRBC: 0 % (ref 0.0–0.2)
nRBC: 0 % (ref 0.0–0.2)
nRBC: 0 % (ref 0.0–0.2)

## 2023-11-07 LAB — URINALYSIS, COMPLETE (UACMP) WITH MICROSCOPIC
Bilirubin Urine: NEGATIVE
Glucose, UA: NEGATIVE mg/dL
Ketones, ur: NEGATIVE mg/dL
Nitrite: NEGATIVE
Protein, ur: 30 mg/dL — AB
RBC / HPF: >50 RBC/hpf (ref 0–5)
Specific Gravity, Urine: 1.021 (ref 1.005–1.030)
Squamous Epithelial / HPF: 0 /HPF (ref 0–5)
pH: 5 (ref 5.0–8.0)

## 2023-11-07 LAB — CBC WITH DIFFERENTIAL/PLATELET
Abs Immature Granulocytes: 0.15 10*3/uL — ABNORMAL HIGH (ref 0.00–0.07)
Basophils Absolute: 0 10*3/uL (ref 0.0–0.1)
Basophils Relative: 0 %
Eosinophils Absolute: 0 10*3/uL (ref 0.0–0.5)
Eosinophils Relative: 0 %
HCT: 47.4 % (ref 39.0–52.0)
Hemoglobin: 16.5 g/dL (ref 13.0–17.0)
Immature Granulocytes: 1 %
Lymphocytes Relative: 4 %
Lymphs Abs: 0.9 10*3/uL (ref 0.7–4.0)
MCH: 31.4 pg (ref 26.0–34.0)
MCHC: 34.8 g/dL (ref 30.0–36.0)
MCV: 90.1 fL (ref 80.0–100.0)
Monocytes Absolute: 1.7 10*3/uL — ABNORMAL HIGH (ref 0.1–1.0)
Monocytes Relative: 9 %
Neutro Abs: 17.6 10*3/uL — ABNORMAL HIGH (ref 1.7–7.7)
Neutrophils Relative %: 86 %
Platelets: 250 10*3/uL (ref 150–400)
RBC: 5.26 MIL/uL (ref 4.22–5.81)
RDW: 13.2 % (ref 11.5–15.5)
WBC: 20.5 10*3/uL — ABNORMAL HIGH (ref 4.0–10.5)
nRBC: 0 % (ref 0.0–0.2)

## 2023-11-07 LAB — ECHOCARDIOGRAM COMPLETE
AR max vel: 2.04 cm2
AV Area VTI: 2.81 cm2
AV Area mean vel: 2.34 cm2
AV Mean grad: 5 mmHg
AV Peak grad: 9.3 mmHg
Ao pk vel: 1.53 m/s
Area-P 1/2: 3.74 cm2
Calc EF: 66.4 %
Height: 74 in
MV VTI: 3.47 cm2
S' Lateral: 2.7 cm
Single Plane A2C EF: 65.2 %
Single Plane A4C EF: 65.1 %
Weight: 3276.92 [oz_av]

## 2023-11-07 LAB — COMPREHENSIVE METABOLIC PANEL
ALT: 15 U/L (ref 0–44)
ALT: 15 U/L (ref 0–44)
ALT: 15 U/L (ref 0–44)
ALT: 18 U/L (ref 0–44)
AST: 18 U/L (ref 15–41)
AST: 19 U/L (ref 15–41)
AST: 20 U/L (ref 15–41)
AST: 23 U/L (ref 15–41)
Albumin: 3.7 g/dL (ref 3.5–5.0)
Albumin: 3.7 g/dL (ref 3.5–5.0)
Albumin: 3.8 g/dL (ref 3.5–5.0)
Albumin: 4.1 g/dL (ref 3.5–5.0)
Alkaline Phosphatase: 71 U/L (ref 38–126)
Alkaline Phosphatase: 71 U/L (ref 38–126)
Alkaline Phosphatase: 73 U/L (ref 38–126)
Alkaline Phosphatase: 84 U/L (ref 38–126)
Anion gap: 10 (ref 5–15)
Anion gap: 12 (ref 5–15)
Anion gap: 15 (ref 5–15)
Anion gap: 9 (ref 5–15)
BUN: 19 mg/dL (ref 6–20)
BUN: 21 mg/dL — ABNORMAL HIGH (ref 6–20)
BUN: 23 mg/dL — ABNORMAL HIGH (ref 6–20)
BUN: 26 mg/dL — ABNORMAL HIGH (ref 6–20)
CO2: 19 mmol/L — ABNORMAL LOW (ref 22–32)
CO2: 22 mmol/L (ref 22–32)
CO2: 24 mmol/L (ref 22–32)
CO2: 25 mmol/L (ref 22–32)
Calcium: 9.1 mg/dL (ref 8.9–10.3)
Calcium: 9.2 mg/dL (ref 8.9–10.3)
Calcium: 9.3 mg/dL (ref 8.9–10.3)
Calcium: 9.3 mg/dL (ref 8.9–10.3)
Chloride: 105 mmol/L (ref 98–111)
Chloride: 106 mmol/L (ref 98–111)
Chloride: 108 mmol/L (ref 98–111)
Chloride: 110 mmol/L (ref 98–111)
Creatinine, Ser: 0.48 mg/dL — ABNORMAL LOW (ref 0.61–1.24)
Creatinine, Ser: 0.51 mg/dL — ABNORMAL LOW (ref 0.61–1.24)
Creatinine, Ser: 0.53 mg/dL — ABNORMAL LOW (ref 0.61–1.24)
Creatinine, Ser: 0.61 mg/dL (ref 0.61–1.24)
GFR, Estimated: >60 mL/min (ref >60)
GFR, Estimated: >60 mL/min (ref >60)
GFR, Estimated: >60 mL/min (ref >60)
GFR, Estimated: >60 mL/min (ref >60)
Glucose, Bld: 132 mg/dL — ABNORMAL HIGH (ref 70–99)
Glucose, Bld: 141 mg/dL — ABNORMAL HIGH (ref 70–99)
Glucose, Bld: 159 mg/dL — ABNORMAL HIGH (ref 70–99)
Glucose, Bld: 160 mg/dL — ABNORMAL HIGH (ref 70–99)
Potassium: 3.4 mmol/L — ABNORMAL LOW (ref 3.5–5.1)
Potassium: 3.7 mmol/L (ref 3.5–5.1)
Potassium: 4 mmol/L (ref 3.5–5.1)
Potassium: 4.2 mmol/L (ref 3.5–5.1)
Sodium: 139 mmol/L (ref 135–145)
Sodium: 140 mmol/L (ref 135–145)
Sodium: 142 mmol/L (ref 135–145)
Sodium: 144 mmol/L (ref 135–145)
Total Bilirubin: 0.6 mg/dL (ref 0.0–1.2)
Total Bilirubin: 0.8 mg/dL (ref 0.0–1.2)
Total Bilirubin: 0.9 mg/dL (ref 0.0–1.2)
Total Bilirubin: 0.9 mg/dL (ref 0.0–1.2)
Total Protein: 7.2 g/dL (ref 6.5–8.1)
Total Protein: 7.3 g/dL (ref 6.5–8.1)
Total Protein: 7.3 g/dL (ref 6.5–8.1)
Total Protein: 7.8 g/dL (ref 6.5–8.1)

## 2023-11-07 LAB — URINALYSIS, W/ REFLEX TO CULTURE (INFECTION SUSPECTED)
Bilirubin Urine: NEGATIVE
Glucose, UA: NEGATIVE mg/dL
Ketones, ur: NEGATIVE mg/dL
Leukocytes,Ua: NEGATIVE
Nitrite: NEGATIVE
Protein, ur: NEGATIVE mg/dL
Specific Gravity, Urine: 1.029 (ref 1.005–1.030)
Squamous Epithelial / HPF: 0 /HPF (ref 0–5)
pH: 5 (ref 5.0–8.0)

## 2023-11-07 LAB — GLUCOSE, CAPILLARY
Glucose-Capillary: 158 mg/dL — ABNORMAL HIGH (ref 70–99)
Glucose-Capillary: 157 mg/dL — ABNORMAL HIGH (ref 70–99)
Glucose-Capillary: 162 mg/dL — ABNORMAL HIGH (ref 70–99)
Glucose-Capillary: 175 mg/dL — ABNORMAL HIGH (ref 70–99)
Glucose-Capillary: 133 mg/dL — ABNORMAL HIGH (ref 70–99)

## 2023-11-07 LAB — BILIRUBIN, DIRECT
Bilirubin, Direct: 0.1 mg/dL (ref 0.0–0.2)
Bilirubin, Direct: 0.1 mg/dL (ref 0.0–0.2)
Bilirubin, Direct: 0.1 mg/dL (ref 0.0–0.2)
Bilirubin, Direct: 0.2 mg/dL (ref 0.0–0.2)

## 2023-11-07 LAB — PROTIME-INR
INR: 1 (ref 0.8–1.2)
INR: 1 (ref 0.8–1.2)
INR: 1.1 (ref 0.8–1.2)
INR: 1.1 (ref 0.8–1.2)
Prothrombin Time: 13.4 s (ref 11.4–15.2)
Prothrombin Time: 13.5 s (ref 11.4–15.2)
Prothrombin Time: 14.3 s (ref 11.4–15.2)
Prothrombin Time: 14.3 s (ref 11.4–15.2)

## 2023-11-07 LAB — SARS CORONAVIRUS 2 BY RT PCR: SARS Coronavirus 2 by RT PCR: NEGATIVE

## 2023-11-07 LAB — APTT
aPTT: 29 s (ref 24–36)
aPTT: 30 s (ref 24–36)
aPTT: 33 s (ref 24–36)
aPTT: 31 s (ref 24–36)

## 2023-11-07 LAB — PHOSPHORUS
Phosphorus: 2.2 mg/dL — ABNORMAL LOW (ref 2.5–4.6)
Phosphorus: 2.6 mg/dL (ref 2.5–4.6)
Phosphorus: 2.7 mg/dL (ref 2.5–4.6)
Phosphorus: 3.2 mg/dL (ref 2.5–4.6)

## 2023-11-07 LAB — MAGNESIUM
Magnesium: 1.9 mg/dL (ref 1.7–2.4)
Magnesium: 2 mg/dL (ref 1.7–2.4)
Magnesium: 2 mg/dL (ref 1.7–2.4)
Magnesium: 2.1 mg/dL (ref 1.7–2.4)

## 2023-11-07 LAB — TROPONIN I (HIGH SENSITIVITY)
Troponin I (High Sensitivity): 55 ng/L — ABNORMAL HIGH (ref <18)
Troponin I (High Sensitivity): 30 ng/L — ABNORMAL HIGH (ref <18)
Troponin I (High Sensitivity): 72 ng/L — ABNORMAL HIGH (ref <18)

## 2023-11-07 LAB — AMYLASE
Amylase: 41 U/L (ref 28–100)
Amylase: 43 U/L (ref 28–100)

## 2023-11-07 LAB — CK
Total CK: 75 U/L (ref 49–397)
Total CK: 49 U/L (ref 49–397)

## 2023-11-07 LAB — LIPASE, BLOOD
Lipase: 20 U/L (ref 11–51)
Lipase: 23 U/L (ref 11–51)

## 2023-11-07 MED ORDER — IOHEXOL 300 MG/ML  SOLN
100.0000 mL | Freq: Once | INTRAMUSCULAR | Status: AC | PRN
  Administered 2023-11-07: 100 mL via INTRAVENOUS

## 2023-11-07 MED ORDER — CHLORHEXIDINE GLUCONATE CLOTH 2 % EX PADS
6.0000 | MEDICATED_PAD | Freq: Every day | CUTANEOUS | Status: DC
  Administered 2023-11-07 – 2023-11-09 (×3): 6 via TOPICAL

## 2023-11-07 MED ORDER — POTASSIUM CHLORIDE 10 MEQ/100ML IV SOLN
10.0000 meq | INTRAVENOUS | Status: AC
Start: 2023-11-07 — End: 2023-11-07
  Administered 2023-11-07 (×4): 10 meq via INTRAVENOUS
  Filled 2023-11-07 (×4): qty 100

## 2023-11-07 MED ORDER — PANTOPRAZOLE SODIUM 40 MG IV SOLR
40.0000 mg | Freq: Two times a day (BID) | INTRAVENOUS | Status: DC
  Administered 2023-11-07 – 2023-11-09 (×4): 40 mg via INTRAVENOUS
  Filled 2023-11-07 (×4): qty 10

## 2023-11-07 NOTE — Procedures (Signed)
 History: 53 year old male with massive intracranial hemorrhage, being evaluated for seizure  Sedation: Fentanyl 100 mcg/h  Patient State: Comatose  Technique: This EEG was acquired with electrodes placed according to the International 10-20 electrode system (including Fp1, Fp2, F3, F4, C3, C4, P3, P4, O1, O2, T3, T4, T5, T6, A1, A2, Fz, Cz, Pz). The following electrodes were missing or displaced: none.   Background: The background consists of low voltage irregular delta and theta range activities, with predominant activity being in the 5 to 6 Hz range.  There is an asymmetry with attenuation of faster frequencies on the left compared to the right.  There are occasional sleep spindles which are symmetric in distribution.  There is no clear reactivity to noxious stimulation.  Photic stimulation: Physiologic driving is not performed  EEG Abnormalities: 1) generalized irregular slow activity 2) attenuation of faster frequencies on the left compared to the right   Clinical Interpretation: This EEG recorded evidence of a focal cerebral dysfunction in the left hemisphere in the setting of a more generalized nonspecific cerebral dysfunction (encephalopathy).  There was no seizure or seizure predisposition recorded on this study. Please note that lack of epileptiform activity on EEG does not preclude the possibility of epilepsy.   Ritta Slot, MD [facility]   If 7pm- 7am, please page neurology on call as listed in AMION.

## 2023-11-07 NOTE — Plan of Care (Signed)
  Problem: Intracerebral Hemorrhage Tissue Perfusion: Goal: Complications of Intracerebral Hemorrhage will be minimized Outcome: Progressing   Problem: Coping: Goal: Will verbalize positive feelings about self Outcome: Progressing   Problem: Pain Managment: Goal: General experience of comfort will improve and/or be controlled Outcome: Progressing   Problem: Safety: Goal: Ability to remain free from injury will improve Outcome: Progressing   Problem: Skin Integrity: Goal: Risk for impaired skin integrity will decrease Outcome: Progressing

## 2023-11-07 NOTE — Procedures (Signed)
 Arterial Catheter Insertion Procedure Note  JAVIN NONG  [id number]  Oct 29, 1970  Date:11/07/23  Time:5:12 AM   Provider Performing: Loraine Leriche   Procedure: Insertion of Arterial Line (40981) without US guidance  Indication(s) Blood pressure monitoring and/or need for frequent ABGs  Consent Risks of the procedure as well as the alternatives and risks of each were explained to the patient and/or caregiver.  Consent for the procedure was obtained and is signed in the bedside chart  Anesthesia None  Time Out Verified patient identification, verified procedure, site/side was marked, verified correct patient position, special equipment/implants available, medications/allergies/relevant history reviewed, required imaging and test results available.  Sterile Technique Maximal sterile technique including full sterile barrier drape, hand hygiene, sterile gown, sterile gloves, mask, hair covering, sterile ultrasound probe cover (if used).  Procedure Description Area of catheter insertion was cleaned with chlorhexidine and draped in sterile fashion. Without real-time ultrasound guidance an arterial catheter was placed into the right radial artery.  Appropriate arterial tracings confirmed on monitor.    Complications/Tolerance None; patient tolerated the procedure well.  EBL Minimal  Specimen(s) None   Webb Silversmith, DNP, CCRN, FNP-C, AGACNP-BC Acute Care & Family Nurse Practitioner  [facility]  See Amion for personal pager PCCM on call pager [phone] until 7 am

## 2023-11-07 NOTE — Progress Notes (Signed)
 1930 - Received pt from dayshift nurse. OGT placed to LIS w/ 450 pink-tinged, thick secretions out. Assessment per flowsheet.   2245: Provider notified of increasing fever despite PRN Tylenol suppository, decreasing room temperature, ice packs, and removing extra blankets. Scheduled IV Tylenol order and verbal order to place cooling blanket received. Also plan to restart Propofol due to increased respiratory rate and increased WOB.   2253:Advised that [facility] has arrived - provider en route to bedside.   2335: [facility] orders placed per verbal orders from provider.   2345: Provider at bedside to place art line for frequent blood draws and BP monitoring.   0100: K+ 3.4 - provider notified. Order received for IV K+.   0145: Transported to CT with RT, charge RN, and [facility] RN without issues.   1025: Provider notified of OGT secretions changing from pink-tinged to dark red/brown. No new orders received. Pt's brother updated via phone and states he will be arriving later today.

## 2023-11-07 NOTE — Progress Notes (Signed)
 Eeg done

## 2023-11-07 NOTE — Plan of Care (Signed)
  Problem: Education: Goal: Knowledge of disease or condition will improve Outcome: Not Progressing Goal: Knowledge of secondary prevention will improve (MUST DOCUMENT ALL) Outcome: Not Progressing Goal: Knowledge of patient specific risk factors will improve (DELETE if not current risk factor) Outcome: Not Progressing   Problem: Intracerebral Hemorrhage Tissue Perfusion: Goal: Complications of Intracerebral Hemorrhage will be minimized Outcome: Not Progressing   Problem: Coping: Goal: Will verbalize positive feelings about self Outcome: Not Progressing Goal: Will identify appropriate support needs Outcome: Not Progressing   Problem: Health Behavior/Discharge Planning: Goal: Ability to manage health-related needs will improve Outcome: Not Progressing Goal: Goals will be collaboratively established with patient/family Outcome: Not Progressing   Problem: Self-Care: Goal: Ability to participate in self-care as condition permits will improve Outcome: Not Progressing Goal: Verbalization of feelings and concerns over difficulty with self-care will improve Outcome: Not Progressing Goal: Ability to communicate needs accurately will improve Outcome: Not Progressing   Problem: Nutrition: Goal: Risk of aspiration will decrease Outcome: Not Progressing Goal: Dietary intake will improve Outcome: Not Progressing   Problem: Education: Goal: Knowledge of General Education information will improve Description: Including pain rating scale, medication(s)/side effects and non-pharmacologic comfort measures Outcome: Not Progressing   Problem: Health Behavior/Discharge Planning: Goal: Ability to manage health-related needs will improve Outcome: Not Progressing   Problem: Clinical Measurements: Goal: Ability to maintain clinical measurements within normal limits will improve Outcome: Not Progressing Goal: Will remain free from infection Outcome: Not Progressing Goal: Diagnostic test  results will improve Outcome: Not Progressing Goal: Respiratory complications will improve Outcome: Not Progressing Goal: Cardiovascular complication will be avoided Outcome: Not Progressing   Problem: Activity: Goal: Risk for activity intolerance will decrease Outcome: Not Progressing   Problem: Nutrition: Goal: Adequate nutrition will be maintained Outcome: Not Progressing   Problem: Coping: Goal: Level of anxiety will decrease Outcome: Not Progressing   Problem: Elimination: Goal: Will not experience complications related to bowel motility Outcome: Not Progressing Goal: Will not experience complications related to urinary retention Outcome: Not Progressing   Problem: Pain Managment: Goal: General experience of comfort will improve and/or be controlled Outcome: Not Progressing   Problem: Safety: Goal: Ability to remain free from injury will improve Outcome: Not Progressing   Problem: Skin Integrity: Goal: Risk for impaired skin integrity will decrease Outcome: Not Progressing   Problem: Education: Goal: Ability to describe self-care measures that may prevent or decrease complications (Diabetes Survival Skills Education) will improve Outcome: Not Progressing Goal: Individualized Educational Video(s) Outcome: Not Progressing   Problem: Coping: Goal: Ability to adjust to condition or change in health will improve Outcome: Not Progressing   Problem: Fluid Volume: Goal: Ability to maintain a balanced intake and output will improve Outcome: Not Progressing   Problem: Health Behavior/Discharge Planning: Goal: Ability to identify and utilize available resources and services will improve Outcome: Not Progressing Goal: Ability to manage health-related needs will improve Outcome: Not Progressing   Problem: Metabolic: Goal: Ability to maintain appropriate glucose levels will improve Outcome: Not Progressing   Problem: Nutritional: Goal: Maintenance of adequate  nutrition will improve Outcome: Not Progressing Goal: Progress toward achieving an optimal weight will improve Outcome: Not Progressing   Problem: Skin Integrity: Goal: Risk for impaired skin integrity will decrease Outcome: Not Progressing   Problem: Tissue Perfusion: Goal: Adequacy of tissue perfusion will improve Outcome: Not Progressing

## 2023-11-07 NOTE — IPAL (Cosign Needed)
  Interdisciplinary Goals of Care Family Meeting   Date carried out: 11/07/2023  Location of the meeting: Bedside  Member's involved: Nurse Practitioner, Bedside Registered Nurse, and Family Member or next of kin  Durable Power of Attorney or acting medical decision maker: Pt's brother Lupe Bonner and his spouse   Discussion: We discussed goals of care for [facility] .  Reviewed clinical course including massive left-sided subdural hematoma, intraparenchymal hematoma, retroclival hematoma causing ventral compression of the brainstem, and intraventricular hematoma.  Neurosurgery evaluated and deemed findings a terminal hemorrhage/event without a chance for meaningful recovery, and that surgery would be futile. Neuro exam extremely poor indicative of severe brain damage: unresponsive to painful stimuli, pupils fixed and dilated, + doll's eyes (does have + cough/gag reflex and does overbreathe the vent occasionally), along with fluctuations in temperature and BP.  They understand his prognosis and would like to shift to comfort measures and withdraw care.  They do report he is an organ donor and would want his organs donated, and that [facility] have already been in contact with them.  At this time [facility] will take over and we will provide supportive care till the time of procurement.   Code status:   Code Status: Limited: Do not attempt resuscitation (DNR) -DNR-LIMITED -Do Not Intubate/DNI    Disposition: In-patient comfort care/Organ donation   Time spent for the meeting: 15 minutes    Judithe Modest, NP  11/07/2023, 5:05 PM

## 2023-11-07 NOTE — Progress Notes (Signed)
*  PRELIMINARY RESULTS* Echocardiogram 2D Echocardiogram has been performed.  [facility] 11/07/2023, 9:49 AM

## 2023-11-07 NOTE — Progress Notes (Signed)
 NAME:  Roger [facility], MRN:  [id number], DOB:  [birth year], LOS: 1 ADMISSION DATE:  11/06/2023, CONSULTATION DATE: 11/06/2023 REFERRING MD: Dr. Lenard Lance, CHIEF COMPLAINT: Altered Mental Status   History of Present Illness:  This is a 53 yo male who presented to [facility] on 03/16 via EMS from [facility] with altered mental status.  Per ER notes EMS called out to [facility] for "unknown medical."   When EMS arrived on the scene pt noted to have snoring respirations and unresponsive to all stimuli.  Facility staff reported prior to pt becoming unresponsiveness pt c/o dizziness.  Pt placed on NRB and a nasopharyngeal airway prior to EMS transport.    ED Course  Upon arrival to the ER pt on a NRB with snoring respirations, unresponsive, with dilated/non reactive pupils.  Pt with known hx of seizure disorder, prior CVA with left-sided hemiparesis, and receives outpatient opioid medications.  EDP ordered 2 mg of iv ativan, following administration mentation remained unchanged.  Due to inability to protect airway pt mechanically intubated.  CT Head revealed massive left-sided subdural hematoma, intraparenchymal hematoma; retroclival hematoma causing ventral compression of the brainstem; and intraventricular hematoma.  Neurosurgeon Dr. Katrinka Morton consulted and reviewed imaging and performed a physical examination.  Per Dr. Katrinka Morton findings represented a terminal event/hemorrhage and surgical procedures would be futile.  He recommended continuing supportive care from a medical standpoint until further decisions regarding goals of care were discussed or pt progresses to formal diagnosis of brain death. Dr. Katrinka Morton attempted to contact pts family multiple times utilizing phone numbers listed in epic and provided by [employer], however family unreachable.  PCCM team contacted for ICU admission.  Significant lab results: Glucose 182/creatinine 0.55/calcium 8.4/wbc 12.6  CXR: Well-positioned  endotracheal and nasal/orogastric tubes. No evidence of acute cardiopulmonary disease.  CT Head: 13 cc left occipital hematoma with intraventricular extension. Supra and infra tentorial subdural hematoma, most extensive over on the left cerebral convexity with thickness measuring up to 2 cm. 2 cm of midline shift with left uncal and subfalcine herniation. Entrapment of the right lateral ventricle.  Pertinent  Medical History  Anemia  Anxiety  BPH Chronic Pain  Depression  Essential HTN  Essential Tremor  GERD  Stroke with Residual Left Hemiplegia and Hemiparesis  Brain Tumor COVID-19 HLD  Osteoarthritis  Insomnia  Neuropathy  Overactive Bladder  Seizure Disorder   Significant Hospital Events: Including procedures, antibiotic start and stop dates in addition to other pertinent events   03/16: Admitted to ICU with terminal subdural and intraparenchymal hemorrhage requiring mechanical intubation for airway protection  11/07/23- patient s/p eval by [facility] for organ sharing network.  S/p TTE and EEG is in process. Reviewed case with neurologist Dr [facility].  Family support staff is working with family at bedside for organ procurement.  Interim History / Subjective:  Pt mechanically intubated vent settings: FiO2 100%/PEEP 8.  Pt unresponsive/not breathing over the ventilator/gag reflexes present/no corneal reflexes.    Objective   Blood pressure 114/77, pulse 97, temperature 99 F (37.2 C), resp. rate 16, height 6\' 2"  (1.88 m), weight 92.9 kg, SpO2 97%.    Vent Mode: PRVC FiO2 (%):  [50 %-100 %] 50 % Set Rate:  [20 bmp] 20 bmp Vt Set:  [550 mL] 550 mL PEEP:  [8 cmH20] 8 cmH20 Plateau Pressure:  [15 cmH20-16 cmH20] 16 cmH20   Intake/Output Summary (Last 24 hours) at 11/07/2023 0835 Last data filed at 11/07/2023 0800 Gross per 24 hour  Intake  1323.29 ml  Output 2950 ml  Net -1626.71 ml   Filed Weights   11/06/23 0902 11/06/23 1253  Weight: 101.9 kg 92.9 kg    Examination: General: Acute on chronically-ill appearing male, NAD mechanically intubated  HENT: Supple, no JVD  Lungs: Faint rhonchi, throughout, even, non labored  Cardiovascular: NSR, s1s2, no m/r/g, 2+ radial/1+ distal, 1+generalized edema  Abdomen: +BS x4, obese, soft, non distended  Extremities: Not moving extremities  Neuro: Sedated, not following commands or withdrawing from painful stimulation, pt irregular non reactive, no corneal reflexes present, absent cough but no gag reflex present  GU: Indwelling foley catheter draining yellow urine   Resolved Hospital Problem list     Assessment & Plan:   #Massive left-sided subdural hematoma, intraparenchymal hematoma, retroclival hematoma causing ventral compression of the brainstem, and intraventricular hematoma #Mechanical intubation pain/discomfort  Hx: Brain tumor, seizure disorder, stroke with residual left hemiplegia and hemiparesis, and essential tremor  - Neurosurgery consulted appreciate input: CT findings revealed terminal intracranial hemorrhage and surgical interventions deemed futile  - Neuro checks per NIH stroke scale protocol  - Avoid sedating medications as able  - Maintain RASS goal 0  - PAD to maintain RASS goal: Prn propofol  - Seizure precautions - EEG pending  - Nicardipine gtt to maintain sbp 140 or less   #Mildly elevated troponin secondary to demand ischemia  - Continuous telemetry monitoring  - Trend troponin until peaked  - Nicardipine gtt to maintain sbp 140 or less   #Acute respiratory failure #Mechanical intubation for airway protection due to catastrophic intracranial hemorrhage  - Full vent support for now: vent settings reviewed and established  - Continue lung protective strategies  - Maintain plateau pressures less than 30 cm H2O - Intermittent CXR and ABG's - VAP bundle implemented   #Leukocytosis without obvious signs of infection  - Trend WBC and monitor fever curve  - No  indication for abx therapy at this time if pt becomes febrile will start empiric abx   #GERD - PPI  #Hyperglycemia - Hemoglobin A1c pending   - CBG's q4hrs  - SSI - Follow hyper/hypoglycemic protocol  - Target CBG range 140 to 180   Pt is critically ill with terminal intracranial hemorrhage and surgical procedures deemed futile by neurosurgery.  Pt with no chance on meaningful recovery.  Pt likely to progress to formal diagnosis of brain death.  EDP, neurosurgery, and PCCM team attempted to reach pts family multiple times utilizing phone numbers in epic and provided by [employer], however family unreachable.  Recommend changing code status to DNR and proceeding with Comfort Measures Only. Consulted Transition of Care Team to provide assistance with locating pts family.   Best Practice (right click and "Reselect all SmartList Selections" daily)   Diet/type: NPO DVT prophylaxis SCD Pressure ulcer(s): N/A GI prophylaxis: PPI Lines: N/A Foley:  Yes, and it is still needed Code Status:  full code Last date of multidisciplinary goals of care discussion [N/A]  Labs   CBC: Recent Labs  Lab 11/06/23 0836 11/07/23 0018 11/07/23 0526  WBC 12.6* 20.5* 23.0*  NEUTROABS 6.0 17.6*  --   HGB 16.3 16.5 15.8  HCT 48.9 47.4 46.8  MCV 92.3 90.1 91.6  PLT 306 250 243    Basic Metabolic Panel: Recent Labs  Lab 11/06/23 0836 11/07/23 0018  NA 138 140  K 3.6 3.4*  CL 106 106  CO2 25 22  GLUCOSE 182* 132*  BUN 13 19  CREATININE 0.55* 0.51*  CALCIUM 8.4* 9.3  MG  --  2.0  PHOS  --  3.2   GFR: Estimated Creatinine Clearance: 122.7 mL/min (A) (by C-G formula based on SCr of 0.51 mg/dL (L)). Recent Labs  Lab 11/06/23 0836 11/07/23 0018 11/07/23 0526  WBC 12.6* 20.5* 23.0*    Liver Function Tests: Recent Labs  Lab 11/06/23 0836 11/07/23 0018  AST 23 23  ALT 17 18  ALKPHOS 83 84  BILITOT 0.5 0.9  PROT 7.3 7.8  ALBUMIN 4.0 4.1   Recent Labs  Lab 11/07/23 0018   LIPASE 23  AMYLASE 43   No results for input(s): "AMMONIA" in the last 168 hours.  ABG    Component Value Date/Time   PHART 7.41 11/07/2023 0247   PCO2ART 38 11/07/2023 0247   PO2ART 78 (L) 11/07/2023 0247   HCO3 23.9 11/07/2023 0247   ACIDBASEDEF 0.1 11/07/2023 0247   O2SAT 98.3 11/07/2023 0247     Coagulation Profile: Recent Labs  Lab 11/06/23 0836 11/07/23 0018 11/07/23 0526  INR 1.0 1.0 1.0    Cardiac Enzymes: Recent Labs  Lab 11/07/23 0018  CKTOTAL 75    HbA1C: Hgb A1c MFr Bld  Date/Time Value Ref Range Status  11/06/2023 01:04 PM 5.4 4.8 - 5.6 % Final    Comment:    (NOTE) Pre diabetes:          5.7%-6.4%  Diabetes:              >6.4%  Glycemic control for   <7.0% adults with diabetes     CBG: Recent Labs  Lab 11/06/23 1543 11/06/23 1932 11/06/23 2338 11/07/23 0357 11/07/23 0754  GLUCAP 160* 157* 159* 158* 157*    Review of Systems:   Unable to assess pt mechanically intubated   Past Medical History:  He,  has a past medical history of Anemia, Anxiety, BPH (benign prostatic hyperplasia), Chronic pain, Depression, Dysphagia, Essential hypertension, Essential tremor, Facial paralysis on left side, GERD (gastroesophageal reflux disease), Hemiplegia and hemiparesis following cerebral infarction affecting left non-dominant side (HCC), History of brain tumor, History of COVID-19 (08/11/2022), Hyperlipidemia, Insomnia, Left leg weakness, Neuropathy, OA (osteoarthritis), OAB (overactive bladder), Seizure disorder (HCC), Sinus tachycardia, and Stroke (HCC).   Surgical History:   Past Surgical History:  Procedure Laterality Date   BRAIN SURGERY  2013   brain tumor excision   COLONOSCOPY WITH PROPOFOL N/A 08/11/2021   Procedure: COLONOSCOPY WITH PROPOFOL;  Surgeon: Midge Minium, MD;  Location[facility] ENDOSCOPY;  Service: Endoscopy;  Laterality: N/A;   INTRAMEDULLARY (IM) NAIL INTERTROCHANTERIC Left 02/15/2021   Procedure: INTRAMEDULLARY (IM) NAIL  INTERTROCHANTRIC;  Surgeon: Durene Romans, MD;  Location: [facility] OR;  Service: Orthopedics;  Laterality: Left;   REVERSE SHOULDER ARTHROPLASTY Right 10/29/2022   Procedure: REVERSE SHOULDER ARTHROPLASTY;  Surgeon: Beverely Low, MD;  Location: [facility] ORS;  Service: Orthopedics;  Laterality: Right;   REVERSE SHOULDER ARTHROPLASTY Left 05/13/2023   Procedure: REVERSE SHOULDER ARTHROPLASTY;  Surgeon: Beverely Low, MD;  Location: [facility] ORS;  Service: Orthopedics;  Laterality: Left;   shunt intracranial x2       Social History:   reports that he has been smoking cigarettes. He has a 25 pack-year smoking history. He has never used smokeless tobacco. He reports that he does not currently use alcohol. He reports that he does not currently use drugs.   Family History:  His family history is negative for CAD.   Allergies Allergies  Allergen Reactions   Penicillins     Per MAR  Home Medications  Prior to Admission medications   Medication Sig Start Date End Date Taking? Authorizing Provider  acetaminophen (TYLENOL) 325 MG tablet Take 650 mg by mouth 2 (two) times daily. May take an additional 650 mg if needed every 4 hours for pain    [provider]  aspirin 81 MG chewable tablet Chew 81 mg by mouth daily.    [provider]  atorvastatin (LIPITOR) 40 MG tablet Take 40 mg by mouth daily at 4 PM.    [provider]  azelastine (ASTELIN) 0.1 % nasal spray Place 2 sprays into both nostrils 2 (two) times daily. Use in each nostril as directed    [provider]  baclofen (LIORESAL) 20 MG tablet Take 20 mg by mouth in the morning, at noon, and at bedtime. 05/20/21   [provider]  busPIRone (BUSPAR) 10 MG tablet Take 10 mg by mouth 3 (three) times daily.    [provider]  carboxymethylcellulose (ARTIFICIAL TEARS) 1 % ophthalmic solution Place 1 drop into both eyes in the morning, at noon, in the evening, and at bedtime.    [provider]   carboxymethylcellulose 1 % ophthalmic solution Place 1 drop into the left eye at bedtime. Refresh liquigel    [provider]  celecoxib (CELEBREX) 200 MG capsule Take 1 capsule (200 mg total) by mouth daily. 05/14/21   Heather Roberts, NP  clonazePAM (KLONOPIN) 0.25 MG disintegrating tablet Take 0.25 mg by mouth 2 (two) times daily. 04/13/23   [provider]  diclofenac Sodium (VOLTAREN) 1 % GEL Apply 1 g topically 3 (three) times daily. Apply to left hips topically    [provider]  fluticasone (FLONASE) 50 MCG/ACT nasal spray Place 1 spray into both nostrils daily. 05/20/21   [provider]  gabapentin (NEURONTIN) 300 MG capsule Take 300-900 mg by mouth See admin instructions. Take 300 mg in the morning and 900 mg at bedtime    [provider]  ibuprofen (ADVIL) 400 MG tablet Take 400 mg by mouth every 8 (eight) hours as needed (Back pain).    [provider]  Lidocaine 4 % PTCH Apply 1 patch topically daily. Apply to right shoulder for 12 hours max dose =3 patches remove per schedule 05/11/21   [provider]  melatonin 5 MG TABS Take 5 mg by mouth at bedtime.    [provider]  mirabegron ER (MYRBETRIQ) 25 MG TB24 tablet Take 1 tablet (25 mg total) by mouth daily. 06/30/22   Stoioff, Verna Czech, MD  nortriptyline (PAMELOR) 10 MG capsule Take 10-50 mg by mouth See admin instructions. Take 10 mg in the morning and 50 ng at bedtime    [provider]  Omega-3 Fatty Acids (FISH OIL) 1000 MG CAPS Take 1,000 mg by mouth daily.    [provider]  oxycodone (OXY-IR) 5 MG capsule Take 1 capsule (5 mg total) by mouth every 6 (six) hours as needed for pain. 05/13/23   Beverely Low, MD  pantoprazole (PROTONIX) 20 MG tablet Take 20 mg by mouth every morning. 0700    [provider]  Polyethyl Glycol-Propyl Glycol (SYSTANE) 0.4-0.3 % SOLN Place 1 drop into both eyes at bedtime. There is a 2nd order in [facility] stating  "Instill 1 drop into left eye at bedtime for exposure keratitis"    [provider]  propranolol (INDERAL) 10 MG tablet Take 10 mg by mouth 2 (two) times daily.    [provider]  QUEtiapine (SEROQUEL) 200 MG tablet Take 200 mg by mouth at bedtime. 07/29/22   [provider]  senna-docusate (SENOKOT-S) 8.6-50 MG tablet Take 2 tablets by mouth at bedtime. 02/18/21   Marguerita Merles Latif, DO  tamsulosin (FLOMAX) 0.4 MG CAPS capsule Take 0.4 mg by mouth daily. 08/20/20   [provider]     Critical care provider statement:   Total critical care time: 33 minutes   Performed by: Karna Christmas MD   Critical care time was exclusive of separately billable procedures and treating other patients.   Critical care was necessary to treat or prevent imminent or life-threatening deterioration.   Critical care was time spent personally by me on the following activities: development of treatment plan with patient and/or surrogate as well as nursing, discussions with consultants, evaluation of patient's response to treatment, examination of patient, obtaining history from patient or surrogate, ordering and performing treatments and interventions, ordering and review of laboratory studies, ordering and review of radiographic studies, pulse oximetry and re-evaluation of patient's condition.    Vida Rigger, M.D.  Pulmonary & Critical Care Medicine

## 2023-11-07 NOTE — Progress Notes (Signed)
 Upon arrival to room, "[facility]" was being supported by his brother, sister-in-law and niece. Family understanding and accepting of [name]'s decline. "He has been sick for a long time. And it is his time." Chaplain offered prayer and validated care. Brother reports [facility] was funny and also had times of struggle. Brother took care of him after a stroke and then was unable to due to his own health concerns. Brother will be only sibling remaining (sister died a few years ago). Chaplain validated his brotherly love. Family aware chaplain support is available 24/7.      11/07/23 1800  Spiritual Encounters  Type of Visit Initial  Care provided to: Pt and family  Conversation partners present during encounter Nurse  Referral source Nurse (RN/NT/LPN)  Reason for visit End-of-life  OnCall Visit Yes

## 2023-11-07 NOTE — Progress Notes (Signed)
 Initial Nutrition Assessment  DOCUMENTATION CODES:   Not applicable  INTERVENTION:   If tube feeds initiated:  Pivot 1.5@65ml /hr- Initiate at 43ml/hr and increase by 37ml/hr q 8 hours until goal rate is reached.   Free water flushes 30ml q4 hours to maintain tube patency   Regimen provides 2340kcal/day, 146g/day protein and 1347ml/day of free water.   Pt at high refeed risk; recommend monitor potassium, magnesium and phosphorus labs daily until stable  Daily weights   NUTRITION DIAGNOSIS:   Inadequate oral intake related to inability to eat (pt sedated and ventilated) as evidenced by NPO status.  GOAL:   Provide needs based on ASPEN/SCCM guidelines  MONITOR:   Labs, Weight trends, Skin, I & O's, Vent status  REASON FOR ASSESSMENT:   Ventilator    ASSESSMENT:   53 y/o male with h/o stroke with L hemiparesis, tremor, seizures, HLD, GERD, anxiety, depression, BPH and brain tumor s/p removal 2013 who is admitted with ICH.  Pt sedated and ventilated. OGT in place. Pt with terminal intracranial hemorrhage and poor prognosis per MD note. Pt currently undergoing brain death testing. Will hold on tube feeds for now. Pt is likely at refeed risk. Per chart, pt appears weight stable pta.    Medications reviewed and include: colace, insulin, protonix, miralax, senokot  Labs reviewed: K 4.2 wnl, BUN 23(H), creat 0.53(L), P 2.7 wnl, Mg 2.1 wnl Wbc- 22.8(H) Cbgs- 162, 157, 158 x 24 hrs   Patient is currently intubated on ventilator support MV: 10 L/min Temp (24hrs), Avg:99.9 F (37.7 C), Min:97.7 F (36.5 C), Max:102.6 F (39.2 C)  MAP- >19mmHg   UOP-   NUTRITION - FOCUSED PHYSICAL EXAM:  Flowsheet Row Most Recent Value  Orbital Region No depletion  Upper Arm Region Moderate depletion  Thoracic and Lumbar Region No depletion  Buccal Region No depletion  Temple Region No depletion  Clavicle Bone Region Moderate depletion  Clavicle and Acromion Bone Region  Moderate depletion  Scapular Bone Region No depletion  Dorsal Hand No depletion  Patellar Region Moderate depletion  Anterior Thigh Region Moderate depletion  Posterior Calf Region Moderate depletion  Edema (RD Assessment) None  Hair Reviewed  Eyes Reviewed  Mouth Reviewed  Skin Reviewed  Nails Reviewed   Diet Order:   Diet Order             Diet NPO time specified  Diet effective now                  EDUCATION NEEDS:   Not appropriate for education at this time  Skin:  Skin Assessment: Reviewed RN Assessment  Last BM:  pta  Height:   Ht Readings from Last 1 Encounters:  11/06/23 6\' 2"  (1.88 m)    Weight:   Wt Readings from Last 1 Encounters:  11/06/23 92.9 kg    Ideal Body Weight:  86.3 kg  BMI:  Body mass index is 26.3 kg/m.  Estimated Nutritional Needs:   Kcal:  2412kcal/day  Protein:  125-140g/day  Fluid:  2.7-3.0L/day  Betsey Holiday MS, RD, LDN If unable to be reached, please send secure chat to "RD inpatient" available from 8:00a-4:00p daily

## 2023-11-08 ENCOUNTER — Encounter: Payer: Self-pay | Admitting: Anesthesiology

## 2023-11-08 ENCOUNTER — Encounter: Admission: EM | Disposition: E | Payer: Self-pay | Source: Home / Self Care | Attending: Pulmonary Disease

## 2023-11-08 DIAGNOSIS — I6200 Nontraumatic subdural hemorrhage, unspecified: Secondary | ICD-10-CM | POA: Diagnosis not present

## 2023-11-08 DIAGNOSIS — I2510 Atherosclerotic heart disease of native coronary artery without angina pectoris: Secondary | ICD-10-CM

## 2023-11-08 DIAGNOSIS — I629 Nontraumatic intracranial hemorrhage, unspecified: Secondary | ICD-10-CM | POA: Diagnosis not present

## 2023-11-08 HISTORY — PX: RIGHT/LEFT HEART CATH AND CORONARY ANGIOGRAPHY: CATH118266

## 2023-11-08 LAB — URINALYSIS, COMPLETE (UACMP) WITH MICROSCOPIC
Bilirubin Urine: NEGATIVE
Glucose, UA: NEGATIVE mg/dL
Ketones, ur: NEGATIVE mg/dL
Nitrite: NEGATIVE
Protein, ur: 30 mg/dL — AB
Specific Gravity, Urine: 1.02 (ref 1.005–1.030)
Squamous Epithelial / HPF: 0 /HPF (ref 0–5)
pH: 5 (ref 5.0–8.0)

## 2023-11-08 LAB — COMPREHENSIVE METABOLIC PANEL
ALT: 10 U/L (ref 0–44)
ALT: 10 U/L (ref 0–44)
ALT: 11 U/L (ref 0–44)
ALT: 12 U/L (ref 0–44)
ALT: 13 U/L (ref 0–44)
AST: 14 U/L — ABNORMAL LOW (ref 15–41)
AST: 15 U/L (ref 15–41)
AST: 16 U/L (ref 15–41)
AST: 16 U/L (ref 15–41)
AST: 16 U/L (ref 15–41)
Albumin: 3 g/dL — ABNORMAL LOW (ref 3.5–5.0)
Albumin: 3.2 g/dL — ABNORMAL LOW (ref 3.5–5.0)
Albumin: 3.4 g/dL — ABNORMAL LOW (ref 3.5–5.0)
Albumin: 3.4 g/dL — ABNORMAL LOW (ref 3.5–5.0)
Albumin: 3.9 g/dL (ref 3.5–5.0)
Alkaline Phosphatase: 62 U/L (ref 38–126)
Alkaline Phosphatase: 63 U/L (ref 38–126)
Alkaline Phosphatase: 64 U/L (ref 38–126)
Alkaline Phosphatase: 66 U/L (ref 38–126)
Alkaline Phosphatase: 66 U/L (ref 38–126)
Anion gap: 11 (ref 5–15)
Anion gap: 11 (ref 5–15)
Anion gap: 6 (ref 5–15)
Anion gap: 7 (ref 5–15)
Anion gap: 7 (ref 5–15)
BUN: 23 mg/dL — ABNORMAL HIGH (ref 6–20)
BUN: 25 mg/dL — ABNORMAL HIGH (ref 6–20)
BUN: 25 mg/dL — ABNORMAL HIGH (ref 6–20)
BUN: 26 mg/dL — ABNORMAL HIGH (ref 6–20)
BUN: 26 mg/dL — ABNORMAL HIGH (ref 6–20)
CO2: 23 mmol/L (ref 22–32)
CO2: 24 mmol/L (ref 22–32)
CO2: 24 mmol/L (ref 22–32)
CO2: 25 mmol/L (ref 22–32)
CO2: 25 mmol/L (ref 22–32)
Calcium: 8.3 mg/dL — ABNORMAL LOW (ref 8.9–10.3)
Calcium: 8.8 mg/dL — ABNORMAL LOW (ref 8.9–10.3)
Calcium: 8.9 mg/dL (ref 8.9–10.3)
Calcium: 9 mg/dL (ref 8.9–10.3)
Calcium: 9.1 mg/dL (ref 8.9–10.3)
Chloride: 103 mmol/L (ref 98–111)
Chloride: 107 mmol/L (ref 98–111)
Chloride: 108 mmol/L (ref 98–111)
Chloride: 109 mmol/L (ref 98–111)
Chloride: 110 mmol/L (ref 98–111)
Creatinine, Ser: 0.34 mg/dL — ABNORMAL LOW (ref 0.61–1.24)
Creatinine, Ser: 0.35 mg/dL — ABNORMAL LOW (ref 0.61–1.24)
Creatinine, Ser: 0.4 mg/dL — ABNORMAL LOW (ref 0.61–1.24)
Creatinine, Ser: 0.42 mg/dL — ABNORMAL LOW (ref 0.61–1.24)
Creatinine, Ser: 0.44 mg/dL — ABNORMAL LOW (ref 0.61–1.24)
GFR, Estimated: >60 mL/min (ref >60)
GFR, Estimated: >60 mL/min (ref >60)
GFR, Estimated: >60 mL/min (ref >60)
GFR, Estimated: >60 mL/min (ref >60)
GFR, Estimated: >60 mL/min (ref >60)
Glucose, Bld: 132 mg/dL — ABNORMAL HIGH (ref 70–99)
Glucose, Bld: 146 mg/dL — ABNORMAL HIGH (ref 70–99)
Glucose, Bld: 151 mg/dL — ABNORMAL HIGH (ref 70–99)
Glucose, Bld: 162 mg/dL — ABNORMAL HIGH (ref 70–99)
Glucose, Bld: 174 mg/dL — ABNORMAL HIGH (ref 70–99)
Potassium: 3.2 mmol/L — ABNORMAL LOW (ref 3.5–5.1)
Potassium: 3.5 mmol/L (ref 3.5–5.1)
Potassium: 3.5 mmol/L (ref 3.5–5.1)
Potassium: 3.6 mmol/L (ref 3.5–5.1)
Potassium: 3.6 mmol/L (ref 3.5–5.1)
Sodium: 137 mmol/L (ref 135–145)
Sodium: 139 mmol/L (ref 135–145)
Sodium: 141 mmol/L (ref 135–145)
Sodium: 141 mmol/L (ref 135–145)
Sodium: 142 mmol/L (ref 135–145)
Total Bilirubin: 0.6 mg/dL (ref 0.0–1.2)
Total Bilirubin: 0.7 mg/dL (ref 0.0–1.2)
Total Bilirubin: 0.7 mg/dL (ref 0.0–1.2)
Total Bilirubin: 0.9 mg/dL (ref 0.0–1.2)
Total Bilirubin: 0.9 mg/dL (ref 0.0–1.2)
Total Protein: 6.6 g/dL (ref 6.5–8.1)
Total Protein: 6.7 g/dL (ref 6.5–8.1)
Total Protein: 7 g/dL (ref 6.5–8.1)
Total Protein: 7 g/dL (ref 6.5–8.1)
Total Protein: 7.2 g/dL (ref 6.5–8.1)

## 2023-11-08 LAB — POCT I-STAT EG7
Acid-Base Excess: 2 mmol/L (ref 0.0–2.0)
Bicarbonate: 26.9 mmol/L (ref 20.0–28.0)
Calcium, Ion: 1.2 mmol/L (ref 1.15–1.40)
HCT: 40 % (ref 39.0–52.0)
Hemoglobin: 13.6 g/dL (ref 13.0–17.0)
O2 Saturation: 70 %
Potassium: 3.6 mmol/L (ref 3.5–5.1)
Sodium: 144 mmol/L (ref 135–145)
TCO2: 28 mmol/L (ref 22–32)
pCO2, Ven: 41.3 mmHg — ABNORMAL LOW (ref 44–60)
pH, Ven: 7.421 (ref 7.25–7.43)
pO2, Ven: 36 mmHg (ref 32–45)

## 2023-11-08 LAB — CBC
HCT: 41.2 % (ref 39.0–52.0)
HCT: 39.9 % (ref 39.0–52.0)
HCT: 38.3 % — ABNORMAL LOW (ref 39.0–52.0)
HCT: 37.9 % — ABNORMAL LOW (ref 39.0–52.0)
Hemoglobin: 13.8 g/dL (ref 13.0–17.0)
Hemoglobin: 13.8 g/dL (ref 13.0–17.0)
Hemoglobin: 13.3 g/dL (ref 13.0–17.0)
Hemoglobin: 12.9 g/dL — ABNORMAL LOW (ref 13.0–17.0)
MCH: 30.3 pg (ref 26.0–34.0)
MCH: 31.3 pg (ref 26.0–34.0)
MCH: 31.4 pg (ref 26.0–34.0)
MCH: 31.2 pg (ref 26.0–34.0)
MCHC: 33.5 g/dL (ref 30.0–36.0)
MCHC: 34.6 g/dL (ref 30.0–36.0)
MCHC: 34.7 g/dL (ref 30.0–36.0)
MCHC: 34 g/dL (ref 30.0–36.0)
MCV: 90.5 fL (ref 80.0–100.0)
MCV: 90.5 fL (ref 80.0–100.0)
MCV: 90.3 fL (ref 80.0–100.0)
MCV: 91.5 fL (ref 80.0–100.0)
Platelets: 236 10*3/uL (ref 150–400)
Platelets: 232 10*3/uL (ref 150–400)
Platelets: 219 10*3/uL (ref 150–400)
Platelets: 219 10*3/uL (ref 150–400)
RBC: 4.55 MIL/uL (ref 4.22–5.81)
RBC: 4.41 MIL/uL (ref 4.22–5.81)
RBC: 4.24 MIL/uL (ref 4.22–5.81)
RBC: 4.14 MIL/uL — ABNORMAL LOW (ref 4.22–5.81)
RDW: 13.2 % (ref 11.5–15.5)
RDW: 13.3 % (ref 11.5–15.5)
RDW: 13.5 % (ref 11.5–15.5)
RDW: 13.5 % (ref 11.5–15.5)
WBC: 18.4 10*3/uL — ABNORMAL HIGH (ref 4.0–10.5)
WBC: 17.9 10*3/uL — ABNORMAL HIGH (ref 4.0–10.5)
WBC: 18.3 10*3/uL — ABNORMAL HIGH (ref 4.0–10.5)
WBC: 16.2 10*3/uL — ABNORMAL HIGH (ref 4.0–10.5)
nRBC: 0 % (ref 0.0–0.2)
nRBC: 0 % (ref 0.0–0.2)
nRBC: 0 % (ref 0.0–0.2)
nRBC: 0 % (ref 0.0–0.2)

## 2023-11-08 LAB — BLOOD GAS, ARTERIAL
Acid-Base Excess: 4.6 mmol/L — ABNORMAL HIGH (ref 0.0–2.0)
Acid-Base Excess: 4.5 mmol/L — ABNORMAL HIGH (ref 0.0–2.0)
Acid-Base Excess: 3.9 mmol/L — ABNORMAL HIGH (ref 0.0–2.0)
Acid-Base Excess: 3.2 mmol/L — ABNORMAL HIGH (ref 0.0–2.0)
Bicarbonate: 28.3 mmol/L — ABNORMAL HIGH (ref 20.0–28.0)
Bicarbonate: 28.4 mmol/L — ABNORMAL HIGH (ref 20.0–28.0)
Bicarbonate: 27.7 mmol/L (ref 20.0–28.0)
Bicarbonate: 26.9 mmol/L (ref 20.0–28.0)
FIO2: 50 %
FIO2: 50 %
FIO2: 45 %
FIO2: 50 %
MECHVT: 550 mL
MECHVT: 550 mL
MECHVT: 550 mL
MECHVT: 550 mL
Mechanical Rate: 20
Mechanical Rate: 20
Mechanical Rate: 20
Mechanical Rate: 20
O2 Saturation: 100 %
O2 Saturation: 99.2 %
O2 Saturation: 99.1 %
O2 Saturation: 99.2 %
PEEP: 5 cmH2O
PEEP: 5 cmH2O
PEEP: 5 cmH2O
PEEP: 8 cmH2O
Patient temperature: 37
Patient temperature: 37
Patient temperature: 37
Patient temperature: 37
pCO2 arterial: 38 mmHg (ref 32–48)
pCO2 arterial: 39 mmHg (ref 32–48)
pCO2 arterial: 38 mmHg (ref 32–48)
pCO2 arterial: 37 mmHg (ref 32–48)
pH, Arterial: 7.48 — ABNORMAL HIGH (ref 7.35–7.45)
pH, Arterial: 7.47 — ABNORMAL HIGH (ref 7.35–7.45)
pH, Arterial: 7.47 — ABNORMAL HIGH (ref 7.35–7.45)
pH, Arterial: 7.47 — ABNORMAL HIGH (ref 7.35–7.45)
pO2, Arterial: 88 mmHg (ref 83–108)
pO2, Arterial: 93 mmHg (ref 83–108)
pO2, Arterial: 77 mmHg — ABNORMAL LOW (ref 83–108)
pO2, Arterial: 80 mmHg — ABNORMAL LOW (ref 83–108)

## 2023-11-08 LAB — GLUCOSE, CAPILLARY
Glucose-Capillary: 108 mg/dL — ABNORMAL HIGH (ref 70–99)
Glucose-Capillary: 115 mg/dL — ABNORMAL HIGH (ref 70–99)
Glucose-Capillary: 135 mg/dL — ABNORMAL HIGH (ref 70–99)
Glucose-Capillary: 146 mg/dL — ABNORMAL HIGH (ref 70–99)
Glucose-Capillary: 149 mg/dL — ABNORMAL HIGH (ref 70–99)
Glucose-Capillary: 152 mg/dL — ABNORMAL HIGH (ref 70–99)
Glucose-Capillary: 153 mg/dL — ABNORMAL HIGH (ref 70–99)

## 2023-11-08 LAB — POCT I-STAT 7, (LYTES, BLD GAS, ICA,H+H)
Acid-Base Excess: 1 mmol/L (ref 0.0–2.0)
Bicarbonate: 25 mmol/L (ref 20.0–28.0)
Calcium, Ion: 1.18 mmol/L (ref 1.15–1.40)
HCT: 39 % (ref 39.0–52.0)
Hemoglobin: 13.3 g/dL (ref 13.0–17.0)
O2 Saturation: 93 %
Potassium: 3.5 mmol/L (ref 3.5–5.1)
Sodium: 144 mmol/L (ref 135–145)
TCO2: 26 mmol/L (ref 22–32)
pCO2 arterial: 36.7 mmHg (ref 32–48)
pH, Arterial: 7.441 (ref 7.35–7.45)
pO2, Arterial: 65 mmHg — ABNORMAL LOW (ref 83–108)

## 2023-11-08 LAB — MAGNESIUM
Magnesium: 2.1 mg/dL (ref 1.7–2.4)
Magnesium: 2.2 mg/dL (ref 1.7–2.4)
Magnesium: 2.2 mg/dL (ref 1.7–2.4)
Magnesium: 2.2 mg/dL (ref 1.7–2.4)
Magnesium: 2.3 mg/dL (ref 1.7–2.4)

## 2023-11-08 LAB — BILIRUBIN, DIRECT
Bilirubin, Direct: 0.1 mg/dL (ref 0.0–0.2)
Bilirubin, Direct: 0.1 mg/dL (ref 0.0–0.2)
Bilirubin, Direct: 0.2 mg/dL (ref 0.0–0.2)
Bilirubin, Direct: 0.2 mg/dL (ref 0.0–0.2)
Bilirubin, Direct: <0.1 mg/dL (ref 0.0–0.2)

## 2023-11-08 LAB — PROTIME-INR
INR: 1 (ref 0.8–1.2)
INR: 1 (ref 0.8–1.2)
INR: 1 (ref 0.8–1.2)
INR: 1.1 (ref 0.8–1.2)
Prothrombin Time: 13.6 s (ref 11.4–15.2)
Prothrombin Time: 13.8 s (ref 11.4–15.2)
Prothrombin Time: 13.8 s (ref 11.4–15.2)
Prothrombin Time: 14.4 s (ref 11.4–15.2)

## 2023-11-08 LAB — APTT
aPTT: 30 s (ref 24–36)
aPTT: 32 s (ref 24–36)
aPTT: 35 s (ref 24–36)
aPTT: 37 s — ABNORMAL HIGH (ref 24–36)

## 2023-11-08 LAB — TROPONIN I (HIGH SENSITIVITY)
Troponin I (High Sensitivity): 26 ng/L — ABNORMAL HIGH (ref <18)
Troponin I (High Sensitivity): 32 ng/L — ABNORMAL HIGH (ref <18)
Troponin I (High Sensitivity): 39 ng/L — ABNORMAL HIGH (ref <18)
Troponin I (High Sensitivity): 49 ng/L — ABNORMAL HIGH (ref <18)

## 2023-11-08 LAB — PHOSPHORUS
Phosphorus: 2.2 mg/dL — ABNORMAL LOW (ref 2.5–4.6)
Phosphorus: 2.2 mg/dL — ABNORMAL LOW (ref 2.5–4.6)
Phosphorus: 2.3 mg/dL — ABNORMAL LOW (ref 2.5–4.6)
Phosphorus: 2.4 mg/dL — ABNORMAL LOW (ref 2.5–4.6)
Phosphorus: 2.5 mg/dL (ref 2.5–4.6)

## 2023-11-08 LAB — FIBRINOGEN
Fibrinogen: >800 mg/dL — ABNORMAL HIGH (ref 210–475)
Fibrinogen: 782 mg/dL — ABNORMAL HIGH (ref 210–475)
Fibrinogen: >800 mg/dL — ABNORMAL HIGH (ref 210–475)
Fibrinogen: >800 mg/dL — ABNORMAL HIGH (ref 210–475)

## 2023-11-08 LAB — AMYLASE: Amylase: 33 U/L (ref 28–100)

## 2023-11-08 LAB — PREPARE RBC (CROSSMATCH)

## 2023-11-08 LAB — CK
Total CK: 38 U/L — ABNORMAL LOW (ref 49–397)
Total CK: 42 U/L — ABNORMAL LOW (ref 49–397)

## 2023-11-08 LAB — ABO/RH: ABO/RH(D): O POS

## 2023-11-08 LAB — LIPASE, BLOOD: Lipase: 21 U/L (ref 11–51)

## 2023-11-08 SURGERY — RIGHT/LEFT HEART CATH AND CORONARY ANGIOGRAPHY
Anesthesia: Moderate Sedation

## 2023-11-08 MED ORDER — FLUCONAZOLE IN SODIUM CHLORIDE 400-0.9 MG/200ML-% IV SOLN
400.0000 mg | INTRAVENOUS | Status: DC
  Administered 2023-11-08 – 2023-11-09 (×2): 400 mg via INTRAVENOUS
  Filled 2023-11-08 (×2): qty 200

## 2023-11-08 MED ORDER — SODIUM CHLORIDE 0.9 % IV SOLN
2.0000 g | Freq: Three times a day (TID) | INTRAVENOUS | Status: DC
  Administered 2023-11-08 – 2023-11-09 (×5): 2 g via INTRAVENOUS
  Filled 2023-11-08 (×6): qty 12.5

## 2023-11-08 MED ORDER — LABETALOL HCL 5 MG/ML IV SOLN: 10.0000 mg | INTRAVENOUS | Status: AC | PRN

## 2023-11-08 MED ORDER — SODIUM CHLORIDE 0.9% FLUSH: 3.0000 mL | INTRAVENOUS | Status: DC | PRN

## 2023-11-08 MED ORDER — LINEZOLID 600 MG/300ML IV SOLN
600.0000 mg | Freq: Two times a day (BID) | INTRAVENOUS | Status: DC
  Administered 2023-11-08 – 2023-11-09 (×2): 600 mg via INTRAVENOUS
  Filled 2023-11-08 (×2): qty 300

## 2023-11-08 MED ORDER — HEPARIN (PORCINE) IN NACL 2000-0.9 UNIT/L-% IV SOLN
INTRAVENOUS | Status: DC | PRN
  Administered 2023-11-08: 1000 mL

## 2023-11-08 MED ORDER — HYDRALAZINE HCL 20 MG/ML IJ SOLN: 10.0000 mg | INTRAMUSCULAR | Status: AC | PRN

## 2023-11-08 MED ORDER — SODIUM CHLORIDE 0.9% FLUSH
3.0000 mL | Freq: Two times a day (BID) | INTRAVENOUS | Status: DC
  Administered 2023-11-08 – 2023-11-09 (×2): 3 mL via INTRAVENOUS

## 2023-11-08 MED ORDER — LIDOCAINE HCL (PF) 1 % IJ SOLN
INTRAMUSCULAR | Status: DC | PRN
  Administered 2023-11-08: 3 mL

## 2023-11-08 MED ORDER — SODIUM CHLORIDE 0.9 % IV SOLN: INTRAVENOUS | Status: DC

## 2023-11-08 MED ORDER — SODIUM CHLORIDE 0.9% IV SOLUTION: Freq: Once | INTRAVENOUS | Status: DC

## 2023-11-08 MED ORDER — SODIUM CHLORIDE 0.9 % IV SOLN: 250.0000 mL | INTRAVENOUS | Status: DC | PRN

## 2023-11-08 SURGICAL SUPPLY — 13 items
CATH INFINITI 5FR JL5 (CATHETERS) IMPLANT
CATH INFINITI 5FR MULTPACK ANG (CATHETERS) IMPLANT
CATH SWAN GANZ 7F STRAIGHT (CATHETERS) IMPLANT
DEVICE CLOSURE MYNXGRIP 5F (Vascular Products) IMPLANT
DRAPE BRACHIAL (DRAPES) IMPLANT
KIT MICROPUNCTURE NIT STIFF (SHEATH) IMPLANT
PACK CARDIAC CATH (CUSTOM PROCEDURE TRAY) ×1 IMPLANT
PROTECTION STATION PRESSURIZED (MISCELLANEOUS) ×1 IMPLANT
SET ATX-X65L (MISCELLANEOUS) IMPLANT
SHEATH AVANTI 5FR X 11CM (SHEATH) IMPLANT
SHEATH AVANTI 7FRX11 (SHEATH) IMPLANT
STATION PROTECTION PRESSURIZED (MISCELLANEOUS) IMPLANT
WIRE GUIDERIGHT .035X150 (WIRE) IMPLANT

## 2023-11-08 NOTE — Progress Notes (Signed)
 Pharmacy Antibiotic Note  Roger Morton is a 53 y.o. male admitted on 11/06/2023 with sepsis.  Pharmacy has been consulted for Cefepime dosing.  Plan: Cefepime 2 gm q8hr per indication & renal fxn.  Pharmacy will continue to follow and will adjust abx dosing whenever warranted.  Temp (24hrs), Avg:99.7 F (37.6 C), Min:97.9 F (36.6 C), Max:101.3 F (38.5 C)   Recent Labs  Lab 11/07/23 0018 11/07/23 0526 11/07/23 1004 11/07/23 1101 11/07/23 1114 11/07/23 1755 11/07/23 2332  WBC 20.5* 23.0*  --  22.8*  --  22.1* 20.2*  CREATININE 0.51*  --  0.61  --  0.53* 0.48* 0.44*    Estimated Creatinine Clearance: 122.7 mL/min (A) (by C-G formula based on SCr of 0.44 mg/dL (L)).    Allergies  Allergen Reactions   Penicillins     Per MAR     Antimicrobials this admission: 3/18 Cefepime >>  3/18 Fluconazole >>   Microbiology results: 3/17 BCx: Pending 3/17 UCx: Pending  3/17 RespCx: Yeast / GPC / GNR  Thank you for allowing pharmacy to be a part of this patient's care.  Otelia Sergeant, PharmD, MBA 11/08/2023 1:30 AM

## 2023-11-08 NOTE — Interval H&P Note (Signed)
 History and Physical Interval Note:  11/08/2023 2:20 PM  Roger Morton  has presented today for surgery, with the diagnosis of Intracranial hemorrhage.  The various methods of treatment have been discussed with the patient and family. After consideration of risks, benefits and other options for treatment, the patient has consented to  Procedure(s): RIGHT/LEFT HEART CATH AND CORONARY ANGIOGRAPHY (N/A) as a surgical intervention.  The patient's history has been reviewed, patient examined, no change in status, stable for surgery.  I have reviewed the patient's chart and labs.  Questions were answered to the patient's satisfaction.     [name]

## 2023-11-08 NOTE — Consult Note (Signed)
 NEUROLOGY CONSULT NOTE   Date of service: November 08, 2023 Patient Name: Roger Morton MRN:  [id number] DOB:  Nov 10, 1970 Chief Complaint: "Unresponsive" Requesting Provider: Vida Rigger, MD  History of Present Illness  Roger Morton is a 53 y.o. male with hx of previous stroke with left hemiparesis who presented on March 16 with altered mental status.  He was unresponsive with nonreactive pupils.  Head CT was performed which shows a very large subdural hematoma with evidence of herniation as well as intraparenchymal and intraventricular hemorrhage.  Neurosurgery was consulted who evaluated the patient and felt that his hemorrhage was so severe that any type of surgical procedure would be futile.  Since admission, he has remained unresponsive, with fixed pupils and absent corneals, but continues to have a cough.     Past History   Past Medical History:  Diagnosis Date   Anemia    Anxiety    BPH (benign prostatic hyperplasia)    Chronic pain    Depression    Dysphagia    Essential hypertension    Essential tremor    Facial paralysis on left side    GERD (gastroesophageal reflux disease)    Hemiplegia and hemiparesis following cerebral infarction affecting left non-dominant side (HCC)    History of brain tumor    History of COVID-19 08/11/2022   Hyperlipidemia    Insomnia    Left leg weakness    S/P stroke   Neuropathy    OA (osteoarthritis)    OAB (overactive bladder)    Seizure disorder (HCC)    Sinus tachycardia    Stroke [facility])    had brain tumor, and had CVAs during operation for his brain tumor    Past Surgical History:  Procedure Laterality Date   BRAIN SURGERY  2013   brain tumor excision   COLONOSCOPY WITH PROPOFOL N/A 08/11/2021   Procedure: COLONOSCOPY WITH PROPOFOL;  Surgeon: Roger Minium, MD;  Location: [facility] ENDOSCOPY;  Service: Endoscopy;  Laterality: N/A;   INTRAMEDULLARY (IM) NAIL INTERTROCHANTERIC Left 02/15/2021   Procedure: INTRAMEDULLARY (IM)  NAIL INTERTROCHANTRIC;  Surgeon: Roger Romans, MD;  Location: [facility] OR;  Service: Orthopedics;  Laterality: Left;   REVERSE SHOULDER ARTHROPLASTY Right 10/29/2022   Procedure: REVERSE SHOULDER ARTHROPLASTY;  Surgeon: Roger Low, MD;  Location: [facility] ORS;  Service: Orthopedics;  Laterality: Right;   REVERSE SHOULDER ARTHROPLASTY Left 05/13/2023   Procedure: REVERSE SHOULDER ARTHROPLASTY;  Surgeon: Roger Low, MD;  Location: [facility] ORS;  Service: Orthopedics;  Laterality: Left;   shunt intracranial x2      Family History: Family History  Problem Relation Age of Onset   CAD Neg Hx     Social History  reports that he has been smoking cigarettes. He has a 25 pack-year smoking history. He has never used smokeless tobacco. He reports that he does not currently use alcohol. He reports that he does not currently use drugs.  Allergies  Allergen Reactions   Penicillins     Per MAR     Medications   Current Facility-Administered Medications:     stroke: early stages of recovery book, , Does not apply, Once, Roger Essex, NP   acetaminophen (TYLENOL) tablet 650 mg, 650 mg, Per Tube, Q4H PRN **OR** acetaminophen (TYLENOL) 160 MG/5ML solution 650 mg, 650 mg, Per Tube, Q4H PRN **OR** acetaminophen (TYLENOL) suppository 650 mg, 650 mg, Rectal, Q4H PRN, Roger Essex, NP, 650 mg at 11/06/23 2106   ceFEPIme (MAXIPIME) 2 g in sodium chloride 0.9 % 100  mL IVPB, 2 g, Intravenous, Q8H, Otelia Morton, RPH, Stopped at 11/08/23 0316   Chlorhexidine Gluconate Cloth 2 % PADS 6 each, 6 each, Topical, Q2000, Roger Morton, Roger Hartshorn, NP, 6 each at 11/07/23 0930   docusate (COLACE) 50 MG/5ML liquid 100 mg, 100 mg, Per Tube, BID, Jimmye Norman, NP, 100 mg at 11/07/23 2133   fentaNYL (SUBLIMAZE) bolus via infusion 50-100 mcg, 50-100 mcg, Intravenous, Q1H PRN, Jimmye Norman, NP, 100 mcg at 11/08/23 0251   fentaNYL in NS (62mcg/ml) infusion-PREMIX, 50-200 mcg/hr, Intravenous, Continuous,  Roger Morton, Roger Hartshorn, NP, Stopped at 11/07/23 1043   fluconazole (DIFLUCAN) IVPB 400 mg, 400 mg, Intravenous, Q24H, Roger Morton, Roger Hartshorn, NP, Stopped at 11/08/23 0233   insulin aspart (novoLOG) injection 0-15 Units, 0-15 Units, Subcutaneous, Q4H, Roger Essex, NP, 2 Units at 11/08/23 0729   nicardipine (CARDENE) 20mg  in 0.86% saline IV infusion (0.1 mg/ml), 3-15 mg/hr, Intravenous, Continuous, Roger Antis, MD, Last Rate: 75 mL/hr at 11/08/23 0818, 7.5 mg/hr at 11/08/23 0818   Oral care mouth rinse, 15 mL, Mouth Rinse, Q2H, Morton, Jean-Pierre, MD, 15 mL at 11/08/23 0740   Oral care mouth rinse, 15 mL, Mouth Rinse, PRN, Morton, [place], MD   pantoprazole (PROTONIX) injection 40 mg, 40 mg, Intravenous, Q12H, Roger Ditty D, NP, 40 mg at 11/07/23 2133   polyethylene glycol (MIRALAX / GLYCOLAX) packet 17 g, 17 g, Per Tube, Daily, Roger Morton, Roger Hartshorn, NP, 17 g at 11/07/23 1057   propofol (DIPRIVAN) 1000 MG/100ML infusion, 5-80 mcg/kg/min, Intravenous, Continuous, Roger Antis, MD, Last Rate: 15.29 mL/hr at 11/08/23 0530, 25 mcg/kg/min at 11/08/23 0530   senna-docusate (Senokot-S) tablet 1 tablet, 1 tablet, Per Tube, BID, Roger Essex, NP, 1 tablet at 11/07/23 2134  Vitals   Vitals:   11/08/23 0645 11/08/23 0700 11/08/23 0745 11/08/23 0800  BP:      Pulse: 99 98 (!) 102 97  Resp: (!) 21 (!) 21 (!) 22 (!) 22  Temp: 98.6 F (37 C) 98.6 F (37 C) 98.8 F (37.1 C) 98.8 F (37.1 C)  TempSrc:  Bladder    SpO2: 97% 98% 96% 96%  Weight:      Height:        Body mass index is 27.2 kg/m.  Physical Exam   Intubated in the intensive care unit  Neurologic Examination   Mental Status: Patient does not respond to verbal stimuli.  Does not respond to deep sternal rub.  Does not follow commands.  No verbalizations are noted.   Cranial Nerves: II: patient does not respond confrontation bilaterally, pupils right 7 mm, left 6 mm,and fixed  bilaterally III,IV,VI: No response to doll's maneuver V,VII: corneal reflex absent bilaterally  VIII: patient does not respond to verbal stimuli IX,X: gag reflex present  Motor: Extremities flaccid throughout.  No spontaneous movement noted.  No purposeful movements noted.  Sensory: Does not respond to noxious stimuli in any extremity.   Cerebellar: Unable to perform due to coma  Gait: Unable to perform due to coma   Labs/Imaging/Neurodiagnostic studies   CBC:  Recent Labs  Lab November 09, 2023 0836 11/07/23 0018 11/07/23 0526 11/07/23 2332 11/08/23 0530  WBC 12.6* 20.5*   < > 20.2* 18.4*  NEUTROABS 6.0 17.6*  --   --   --   HGB 16.3 16.5   < > 14.5 13.8  HCT 48.9 47.4   < > 42.3 41.2  MCV 92.3 90.1   < > 91.4 90.5  PLT 306 250   < >  245 236   < > = values in this interval not displayed.   Basic Metabolic Panel:  Lab Results  Component Value Date   NA 139 11/08/2023   K 3.6 11/08/2023   CO2 25 11/08/2023   GLUCOSE 151 (H) 11/08/2023   BUN 26 (H) 11/08/2023   CREATININE 0.42 (L) 11/08/2023   CALCIUM 8.9 11/08/2023   GFRNONAA >60 11/08/2023   Lipid Panel:  Lab Results  Component Value Date   LDLCALC 94 01/29/2021   HgbA1c:  Lab Results  Component Value Date   HGBA1C 5.4 11/06/2023   Urine Drug Screen: No results found for: "LABOPIA", "COCAINSCRNUR", "LABBENZ", "AMPHETMU", "THCU", "LABBARB"  Alcohol Level No results found for: "ETH" INR  Lab Results  Component Value Date   INR 1.0 11/08/2023   APTT  Lab Results  Component Value Date   APTT 30 11/08/2023   AED levels: No results found for: "PHENYTOIN", "ZONISAMIDE", "LAMOTRIGINE", "LEVETIRACETA"  CT Head without contrast(Personally reviewed): Large left subdural hematoma with uncal and subfalcine herniation.  Also with 13 cc occipital hematoma with IVH.   ASSESSMENT   KEIVON GARDEN is a 53 y.o. male with large subdural hematoma as well as likely secondary intraparenchymal hemorrhage, both duret  and lobar with both clinical and radiographic evidence of herniation.  At this point, I do not feel that this is a survivable injury given that he has already lost the majority of his brainstem reflexes.  Reportedly family is leaning towards comfort care and I would agree that this is the most appropriate step.  RECOMMENDATIONS  Agree with move to comfort care Neurology will be available as needed. ______________________________________________________________________    Stormy Card, MD [facility]

## 2023-11-08 NOTE — Plan of Care (Signed)
  Problem: Education: Goal: Knowledge of disease or condition will improve Outcome: Not Progressing Goal: Knowledge of secondary prevention will improve (MUST DOCUMENT ALL) Outcome: Not Progressing Goal: Knowledge of patient specific risk factors will improve (DELETE if not current risk factor) Outcome: Not Progressing   Problem: Intracerebral Hemorrhage Tissue Perfusion: Goal: Complications of Intracerebral Hemorrhage will be minimized Outcome: Not Progressing   Problem: Coping: Goal: Will verbalize positive feelings about self Outcome: Not Progressing Goal: Will identify appropriate support needs Outcome: Not Progressing   Problem: Health Behavior/Discharge Planning: Goal: Ability to manage health-related needs will improve Outcome: Not Progressing Goal: Goals will be collaboratively established with patient/family Outcome: Not Progressing   Problem: Self-Care: Goal: Ability to participate in self-care as condition permits will improve Outcome: Not Progressing Goal: Verbalization of feelings and concerns over difficulty with self-care will improve Outcome: Not Progressing Goal: Ability to communicate needs accurately will improve Outcome: Not Progressing   Problem: Nutrition: Goal: Risk of aspiration will decrease Outcome: Not Progressing Goal: Dietary intake will improve Outcome: Not Progressing   Problem: Education: Goal: Knowledge of General Education information will improve Description: Including pain rating scale, medication(s)/side effects and non-pharmacologic comfort measures Outcome: Not Progressing   Problem: Health Behavior/Discharge Planning: Goal: Ability to manage health-related needs will improve Outcome: Not Progressing   Problem: Clinical Measurements: Goal: Ability to maintain clinical measurements within normal limits will improve Outcome: Not Progressing Goal: Will remain free from infection Outcome: Not Progressing Goal: Diagnostic test  results will improve Outcome: Not Progressing Goal: Respiratory complications will improve Outcome: Not Progressing Goal: Cardiovascular complication will be avoided Outcome: Not Progressing   Problem: Activity: Goal: Risk for activity intolerance will decrease Outcome: Not Progressing   Problem: Nutrition: Goal: Adequate nutrition will be maintained Outcome: Not Progressing   Problem: Coping: Goal: Level of anxiety will decrease Outcome: Not Progressing   Problem: Elimination: Goal: Will not experience complications related to bowel motility Outcome: Not Progressing Goal: Will not experience complications related to urinary retention Outcome: Not Progressing   Problem: Pain Managment: Goal: General experience of comfort will improve and/or be controlled Outcome: Not Progressing   Problem: Safety: Goal: Ability to remain free from injury will improve Outcome: Not Progressing   Problem: Skin Integrity: Goal: Risk for impaired skin integrity will decrease Outcome: Not Progressing   Problem: Education: Goal: Ability to describe self-care measures that may prevent or decrease complications (Diabetes Survival Skills Education) will improve Outcome: Not Progressing Goal: Individualized Educational Video(s) Outcome: Not Progressing   Problem: Coping: Goal: Ability to adjust to condition or change in health will improve Outcome: Not Progressing   Problem: Fluid Volume: Goal: Ability to maintain a balanced intake and output will improve Outcome: Not Progressing   Problem: Health Behavior/Discharge Planning: Goal: Ability to identify and utilize available resources and services will improve Outcome: Not Progressing Goal: Ability to manage health-related needs will improve Outcome: Not Progressing   Problem: Metabolic: Goal: Ability to maintain appropriate glucose levels will improve Outcome: Not Progressing   Problem: Nutritional: Goal: Maintenance of adequate  nutrition will improve Outcome: Not Progressing Goal: Progress toward achieving an optimal weight will improve Outcome: Not Progressing   Problem: Skin Integrity: Goal: Risk for impaired skin integrity will decrease Outcome: Not Progressing   Problem: Tissue Perfusion: Goal: Adequacy of tissue perfusion will improve Outcome: Not Progressing

## 2023-11-08 NOTE — Progress Notes (Signed)
 0007 - Discussed results of tracheal aspirate with [facility], who requested orders for Cefepime and Diflucan. Provider notified. Orders received. Will administer per orders after receiving abx from pharmacy.

## 2023-11-08 NOTE — Consult Note (Signed)
 Cardiology Consultation   Patient ID: Roger Morton MRN: [id number]; DOB: [birth year]  Admit date: 11/06/2023 Date of Consult: 11/08/2023  PCP:  Aldean Baker, FNP   [facility] Providers Cardiologist:  New     Patient Profile:   Roger Morton is a 53 y.o. male with a hx of stroke with residual left hemiaplasia and hemiparesis as well as seizure disorder, hypertension, hyperlipidemia, and brain tumor, who is being seen 11/08/2023 at the request of Dr. Karna Christmas for cardiac catheterization in anticipation of organ donation.  History of Present Illness:   Roger Morton reportedly became unresponsive at his nursing facility two days ago.  Per his family, he was getting his vitals taken by a medical assistant and suddenly became unresponsive and pulseless.  He was complaining of dizziness and headache shortly before the event.  He was reportedly pulseless.  When EMS arrived, he was reportedly snoring but unresponsive to all stimuli.  He was transported to the ER and intubated for airway protection.  CT of the head demonstrated a large left-sided subdural hematoma as well as left occipital hemorrhage with intraventricular involvement.  Significant mass effect was noted.  Neurosurgery was consulted and felt that this represented a terminal event without possibility of recovery.  Neurology has also evaluated the patient and does not believe that his brain injury is survivable.  The family and critical care team have discussed organ donation, with the family wishing to proceed.   Past Medical History:  Diagnosis Date   Anemia    Anxiety    BPH (benign prostatic hyperplasia)    Chronic pain    Depression    Dysphagia    Essential hypertension    Essential tremor    Facial paralysis on left side    GERD (gastroesophageal reflux disease)    Hemiplegia and hemiparesis following cerebral infarction affecting left non-dominant side (HCC)    History of brain tumor     History of COVID-19 08/11/2022   Hyperlipidemia    Insomnia    Left leg weakness    S/P stroke   Neuropathy    OA (osteoarthritis)    OAB (overactive bladder)    Seizure disorder (HCC)    Sinus tachycardia    Stroke [facility])    had brain tumor, and had CVAs during operation for his brain tumor    Past Surgical History:  Procedure Laterality Date   BRAIN SURGERY  2013   brain tumor excision   COLONOSCOPY WITH PROPOFOL N/A 08/11/2021   Procedure: COLONOSCOPY WITH PROPOFOL;  Surgeon: Midge Minium, MD;  Location: [facility] ENDOSCOPY;  Service: Endoscopy;  Laterality: N/A;   INTRAMEDULLARY (IM) NAIL INTERTROCHANTERIC Left 02/15/2021   Procedure: INTRAMEDULLARY (IM) NAIL INTERTROCHANTRIC;  Surgeon: Durene Romans, MD;  Location: [facility] OR;  Service: Orthopedics;  Laterality: Left;   REVERSE SHOULDER ARTHROPLASTY Right 10/29/2022   Procedure: REVERSE SHOULDER ARTHROPLASTY;  Surgeon: Beverely Low, MD;  Location: [facility] ORS;  Service: Orthopedics;  Laterality: Right;   REVERSE SHOULDER ARTHROPLASTY Left 05/13/2023   Procedure: REVERSE SHOULDER ARTHROPLASTY;  Surgeon: Beverely Low, MD;  Location: [facility] ORS;  Service: Orthopedics;  Laterality: Left;   shunt intracranial x2       Inpatient Medications: Scheduled Meds:   stroke: early stages of recovery book   Does not apply Once   Chlorhexidine Gluconate Cloth  6 each Topical Q2000   docusate  100 mg Per Tube BID   insulin aspart  0-15 Units Subcutaneous Q4H   mouth rinse  15 mL Mouth Rinse Q2H   pantoprazole (PROTONIX) IV  40 mg Intravenous Q12H   polyethylene glycol  17 g Per Tube Daily   senna-docusate  1 tablet Per Tube BID   Continuous Infusions:  [START ON 10/22/2023] sodium chloride     ceFEPime (MAXIPIME) IV Stopped (11/08/23 1003)   fentaNYL infusion INTRAVENOUS Stopped (11/07/23 1043)   fluconazole (DIFLUCAN) IV Stopped (11/08/23 0233)   niCARDipine 7.5 mg/hr (11/08/23 1200)   propofol (DIPRIVAN) infusion 25 mcg/kg/min (11/08/23 1200)   PRN  Meds: acetaminophen **OR** acetaminophen (TYLENOL) oral liquid 160 mg/5 mL **OR** acetaminophen, fentaNYL, mouth rinse  Allergies:    Allergies  Allergen Reactions   Penicillins     Per MAR     Social History:   Unable to obtain.   Family History:   Unable to obtain.  ROS:  Unable to obtain.  Physical Exam/Data:   Vitals:   11/08/23 1015 11/08/23 1030 11/08/23 1045 11/08/23 1200  BP:      Pulse: (!) 103 (!) 103 92 96  Resp: (!) 22 (!) 22 14 (!) 24  Temp: 98.8 F (37.1 C) 98.8 F (37.1 C) 98.8 F (37.1 C) 98.4 F (36.9 C)  TempSrc:    Bladder  SpO2: 97% 97% 97% 96%  Weight:      Height:        Intake/Output Summary (Last 24 hours) at 11/08/2023 1255 Last data filed at 11/08/2023 1200 Gross per 24 hour  Intake 2694.88 ml  Output 1950 ml  Net 744.88 ml      11/08/2023    4:00 AM 11/06/2023   12:53 PM 11/06/2023    9:02 AM  Last 3 Weights  Weight (lbs) 211 lb 13.8 oz 204 lb 12.9 oz 224 lb 9.6 oz  Weight (kg) 96.1 kg 92.9 kg 101.878 kg     Body mass index is 27.2 kg/m.  General: Unresponsive.  Intubated. HEENT: Endotracheal tube in place. Neck: no JVD Vascular: Right radial A-line present.  Left radial pulse 2+. Cardiac: Regular rate and rhythm without murmurs. Lungs: Clear anteriorly. Abd: soft, nontender, no hepatomegaly  Ext: no edema Musculoskeletal:  No deformities. Skin: warm and dry  Neuro: Intubated unresponsive. Psych: Intubated and unresponsive.  EKG:  The EKG was personally reviewed and demonstrates: Sinus tachycardia with left atrial enlargement and poor R wave progression.  Relevant CV Studies: TTE (11/07/2023):  1. Left ventricular ejection fraction, by estimation, is 55 to 60%. Left  ventricular ejection fraction by 3D volume is 63 %. The left ventricle has  normal function. The left ventricle has no regional wall motion  abnormalities. There is mild left  ventricular hypertrophy. Left ventricular diastolic parameters are  consistent  with Grade I diastolic dysfunction (impaired relaxation).   2. Right ventricular systolic function is normal. The right ventricular  size is normal. Tricuspid regurgitation signal is inadequate for assessing  PA pressure.   3. The mitral valve is normal in structure. No evidence of mitral valve  regurgitation. No evidence of mitral stenosis.   4. The aortic valve is normal in structure. Aortic valve regurgitation is  not visualized. No aortic stenosis is present.   5. Aortic dilatation noted. There is borderline dilatation of the aortic  root, measuring 39 mm.   6. The inferior vena cava is normal in size with greater than 50%  respiratory variability, suggesting right atrial pressure of 3 mmHg.   Laboratory Data:  High Sensitivity Troponin:   Recent Labs  Lab 11/07/23 1101 11/07/23  1755 11/25/2023 2332 11/08/23 0529 11/08/23 1138  TROPONINIHS 30* 72* 49* 39* 32*     Chemistry Recent Labs  Lab Nov 25, 2023 2332 11/08/23 0529 11/08/23 1138  NA 137 139 141  K 3.6 3.6 3.5  CL 103 108 109  CO2 23 25 25   GLUCOSE 162* 151* 146*  BUN 25* 26* 26*  CREATININE 0.44* 0.42* 0.40*  CALCIUM 9.1 8.9 8.8*  MG 2.1 2.2 2.2  GFRNONAA >60 >60 >60  ANIONGAP 11 6 7     Recent Labs  Lab 25-Nov-2023 2332 11/08/23 0529 11/08/23 1138  PROT 7.2 7.0 7.0  ALBUMIN 3.9 3.4* 3.4*  AST 16 14* 16  ALT 13 12 10   ALKPHOS 66 64 62  BILITOT 0.9 0.7 0.9   Lipids No results for input(s): "CHOL", "TRIG", "HDL", "LABVLDL", "LDLCALC", "CHOLHDL" in the last 168 hours.  Hematology Recent Labs  Lab November 25, 2023 2332 11/08/23 0530 11/08/23 1138  WBC 20.2* 18.4* 17.9*  RBC 4.63 4.55 4.41  HGB 14.5 13.8 13.8  HCT 42.3 41.2 39.9  MCV 91.4 90.5 90.5  MCH 31.3 30.3 31.3  MCHC 34.3 33.5 34.6  RDW 13.2 13.2 13.3  PLT 245 236 232   Thyroid No results for input(s): "TSH", "FREET4" in the last 168 hours.  BNPNo results for input(s): "BNP", "PROBNP" in the last 168 hours.  DDimer No results for input(s): "DDIMER"  in the last 168 hours.   Radiology/Studies:  EEG adult Result Date: Nov 25, 2023 Rejeana Brock, MD     11-25-23  2:24 PM History: 53 year old male with massive intracranial hemorrhage, being evaluated for seizure Sedation: Fentanyl 100 mcg/h Patient State: Comatose Technique: This EEG was acquired with electrodes placed according to the International 10-20 electrode system (including Fp1, Fp2, F3, F4, C3, C4, P3, P4, O1, O2, T3, T4, T5, T6, A1, A2, Fz, Cz, Pz). The following electrodes were missing or displaced: none. Background: The background consists of low voltage irregular delta and theta range activities, with predominant activity being in the 5 to 6 Hz range.  There is an asymmetry with attenuation of faster frequencies on the left compared to the right.  There are occasional sleep spindles which are symmetric in distribution.  There is no clear reactivity to noxious stimulation. Photic stimulation: Physiologic driving is not performed EEG Abnormalities: 1) generalized irregular slow activity 2) attenuation of faster frequencies on the left compared to the right Clinical Interpretation: This EEG recorded evidence of a focal cerebral dysfunction in the left hemisphere in the setting of a more generalized nonspecific cerebral dysfunction (encephalopathy). There was no seizure or seizure predisposition recorded on this study. Please note that lack of epileptiform activity on EEG does not preclude the possibility of epilepsy. Ritta Slot, MD [facility] If 7pm- 7am, please page neurology on call as listed in AMION.  ECHOCARDIOGRAM COMPLETE Result Date: November 25, 2023    ECHOCARDIOGRAM REPORT   Patient Name:   Roger Morton Date of Exam: 2023-11-25 Medical Rec #:  [id number]        Height:       74.0 in Accession #:    [id number]       Weight:       204.8 lb Date of Birth:  October 16, 1970       BSA:          2.196 m Patient Age:    52 years         BP:           114/77 mmHg Patient  Gender:  M                HR:           86 bpm. Exam Location:  [facility] Procedure: 2D Echo, Cardiac Doppler, Color Doppler and 3D Echo (Both Spectral            and Color Flow Doppler were utilized during procedure). STAT ECHO Indications:     Fever  History:         Patient has prior history of Echocardiogram examinations, most                  recent 02/19/2021. Stroke, Signs/Symptoms:Fever and Chest Pain;                  Risk Factors:Dyslipidemia.  Sonographer:     Mikki Harbor Referring Phys:  [id number] Hubbard Hartshorn OUMA Diagnosing Phys: Lorine Bears MD  Sonographer Comments: Echo performed with patient supine and on artificial respirator. IMPRESSIONS  1. Left ventricular ejection fraction, by estimation, is 55 to 60%. Left ventricular ejection fraction by 3D volume is 63 %. The left ventricle has normal function. The left ventricle has no regional wall motion abnormalities. There is mild left ventricular hypertrophy. Left ventricular diastolic parameters are consistent with Grade I diastolic dysfunction (impaired relaxation).  2. Right ventricular systolic function is normal. The right ventricular size is normal. Tricuspid regurgitation signal is inadequate for assessing PA pressure.  3. The mitral valve is normal in structure. No evidence of mitral valve regurgitation. No evidence of mitral stenosis.  4. The aortic valve is normal in structure. Aortic valve regurgitation is not visualized. No aortic stenosis is present.  5. Aortic dilatation noted. There is borderline dilatation of the aortic root, measuring 39 mm.  6. The inferior vena cava is normal in size with greater than 50% respiratory variability, suggesting right atrial pressure of 3 mmHg. FINDINGS  Left Ventricle: Left ventricular ejection fraction, by estimation, is 55 to 60%. Left ventricular ejection fraction by 3D volume is 63 %. The left ventricle has normal function. The left ventricle has no regional wall motion abnormalities. The left  ventricular internal cavity size was normal in size. There is mild left ventricular hypertrophy. Left ventricular diastolic parameters are consistent with Grade I diastolic dysfunction (impaired relaxation). Right Ventricle: The right ventricular size is normal. No increase in right ventricular wall thickness. Right ventricular systolic function is normal. Tricuspid regurgitation signal is inadequate for assessing PA pressure. Left Atrium: Left atrial size was normal in size. Right Atrium: Right atrial size was normal in size. Pericardium: There is no evidence of pericardial effusion. Mitral Valve: The mitral valve is normal in structure. No evidence of mitral valve regurgitation. No evidence of mitral valve stenosis. MV peak gradient, 3.9 mmHg. The mean mitral valve gradient is 2.0 mmHg. Tricuspid Valve: The tricuspid valve is normal in structure. Tricuspid valve regurgitation is not demonstrated. No evidence of tricuspid stenosis. Aortic Valve: The aortic valve is normal in structure. Aortic valve regurgitation is not visualized. No aortic stenosis is present. Aortic valve mean gradient measures 5.0 mmHg. Aortic valve peak gradient measures 9.3 mmHg. Aortic valve area, by VTI measures 2.81 cm. Pulmonic Valve: The pulmonic valve was normal in structure. Pulmonic valve regurgitation is not visualized. No evidence of pulmonic stenosis. Aorta: Aortic dilatation noted. There is borderline dilatation of the aortic root, measuring 39 mm. Venous: The inferior vena cava is normal in size with greater than 50% respiratory variability, suggesting right atrial pressure of  3 mmHg. IAS/Shunts: No atrial level shunt detected by color flow Doppler. Additional Comments: 3D was performed not requiring image post processing on an independent workstation and was normal.  LEFT VENTRICLE PLAX 2D LVIDd:         4.10 cm         Diastology LVIDs:         2.70 cm         LV e' medial:    11.30 cm/s LV PW:         1.20 cm         LV E/e'  medial:  5.6 LV IVS:        1.20 cm         LV e' lateral:   9.14 cm/s LVOT diam:     2.00 cm         LV E/e' lateral: 6.9 LV SV:         72 LV SV Index:   33 LVOT Area:     3.14 cm        3D Volume EF                                LV 3D EF:    Left                                             ventricul LV Volumes (MOD)                            ar LV vol d, MOD    49.8 ml                    ejection A2C:                                        fraction LV vol d, MOD    41.6 ml                    by 3D A4C:                                        volume is LV vol s, MOD    17.3 ml                    63 %. A2C: LV vol s, MOD    14.5 ml A4C:                           3D Volume EF: LV SV MOD A2C:   32.4 ml       3D EF:        63 % LV SV MOD A4C:   41.6 ml LV SV MOD BP:    31.4 ml RIGHT VENTRICLE RV Basal diam:  3.05 cm RV Mid diam:    2.50 cm RV S prime:     18.30 cm/s TAPSE (M-mode): 2.5 cm LEFT ATRIUM             Index  RIGHT ATRIUM           Index LA diam:        1.90 cm 0.87 cm/m   RA Area:     12.30 cm LA Vol (A2C):   26.1 ml 11.89 ml/m  RA Volume:   27.30 ml  12.43 ml/m LA Vol (A4C):   15.5 ml 7.06 ml/m LA Biplane Vol: 20.5 ml 9.34 ml/m  AORTIC VALVE                    PULMONIC VALVE AV Area (Vmax):    2.04 cm     PV Vmax:       1.42 m/s AV Area (Vmean):   2.34 cm     PV Peak grad:  8.1 mmHg AV Area (VTI):     2.81 cm AV Vmax:           152.50 cm/s AV Vmean:          95.500 cm/s AV VTI:            0.257 m AV Peak Grad:      9.3 mmHg AV Mean Grad:      5.0 mmHg LVOT Vmax:         99.10 cm/s LVOT Vmean:        71.100 cm/s LVOT VTI:          0.230 m LVOT/AV VTI ratio: 0.89  AORTA Ao Root diam: 3.90 cm MITRAL VALVE MV Area (PHT): 3.74 cm    SHUNTS MV Area VTI:   3.47 cm    Systemic VTI:  0.23 m MV Peak grad:  3.9 mmHg    Systemic Diam: 2.00 cm MV Mean grad:  2.0 mmHg MV Vmax:       0.99 m/s MV Vmean:      63.1 cm/s MV Decel Time: 203 msec MV E velocity: 63.00 cm/s MV A velocity: 85.60 cm/s MV E/A  ratio:  0.74 Lorine Bears MD Electronically signed by Lorine Bears MD Signature Date/Time: 11/07/2023/9:59:46 AM    Final    CT CHEST ABDOMEN PELVIS W CONTRAST Result Date: 11/07/2023 CLINICAL DATA:  Potential organ donor EXAM: CT CHEST, ABDOMEN, AND PELVIS WITH CONTRAST TECHNIQUE: Multidetector CT imaging of the chest, abdomen and pelvis was performed following the standard protocol during bolus administration of intravenous contrast. RADIATION DOSE REDUCTION: This exam was performed according to the departmental dose-optimization program which includes automated exposure control, adjustment of the mA and/or kV according to patient size and/or use of iterative reconstruction technique. CONTRAST:  OMNIPAQUE IOHEXOL 300 MG/ML  SOLN COMPARISON:  Chest radiograph 11/06/2023 and CT chest 08/21/2021 FINDINGS: CT CHEST FINDINGS Cardiovascular: Normal heart size. No pericardial effusion. Normal caliber aorta. Mediastinum/Nodes: Endotracheal tube tip in the intrathoracic trachea. Enteric tube tip in the stomach. No thoracic adenopathy. Lungs/Pleura: Paraseptal and centrilobular emphysema greatest in the upper lobes. Multiple bilateral pulmonary nodules many of which demonstrate calcification. These are unchanged from 08/21/2021 and compatible with benign granulomas. Centrilobular micro nodules and tree-in-bud opacities with band like consolidation in the left lower lobe. No pleural effusion or pneumothorax. Musculoskeletal: Bilateral reverse TSAs. No acute fracture or destructive osseous lesion. CT ABDOMEN PELVIS FINDINGS Hepatobiliary: No focal hepatic lesion. Gallbladder and biliary tree are unremarkable. Pancreas: Unremarkable. No pancreatic ductal dilatation or surrounding inflammatory changes. Spleen: Unremarkable. Adrenals/Urinary Tract: Normal adrenal glands. Simple cysts in the left kidney. Urinary calculi or hydronephrosis. Foley catheter and gas within the nondistended bladder. Stomach/Bowel: Rectal  tube. No  bowel obstruction or bowel wall thickening. Normal appendix. Stomach is within normal limits. Vascular/Lymphatic: Aortic atherosclerotic calcification. No lymphadenopathy. Reproductive: Unremarkable. Other: No free intraperitoneal fluid or air. VP shunt catheter with tip terminating in the low pelvis. Musculoskeletal: IM rod and screw fixation left femur. No acute fracture. No destructive osseous lesion. A vascular necrosis both femoral heads. IMPRESSION: 1. Centrilobular micro nodules and tree-in-bud opacities with band like consolidation in the left lower lobe. Findings are compatible with aspiration and/or pneumonia. 2. No acute abnormality in the abdomen or pelvis. Electronically Signed   By: Minerva Fester M.D.   On: 11/07/2023 01:58   CT Head Wo Contrast Result Date: 11/06/2023 CLINICAL DATA:  Altered mental status EXAM: CT HEAD WITHOUT CONTRAST TECHNIQUE: Contiguous axial images were obtained from the base of the skull through the vertex without intravenous contrast. RADIATION DOSE REDUCTION: This exam was performed according to the departmental dose-optimization program which includes automated exposure control, adjustment of the mA and/or kV according to patient size and/or use of iterative reconstruction technique. COMPARISON:  01/14/2021 FINDINGS: Brain: Acute hematoma in the left occipital lobe measuring 38 x 18 x 37 mm. There is intraventricular extension with blood clot in the lateral ventricles. Medially there is contact with the inter hemispheric fissure, presumably the source of a large and generalized subdural hematoma along the left cerebral convexity which measures up to 19 mm laterally on the left. Thinner subdural hematoma also involves the inter hemispheric fissure, right tentorium, and retro clival locations. Pronounced midline shift measuring 2 cm with asymmetric dilatation/entrapment of the right lateral ventricle. Left retromastoid craniotomy with calcified mass at the left CP  angle cistern, known. VP shunt from a right frontal approach, tip near but not immediately at the level of parenchymal hemorrhage, coarse distorted by the brain shift. Vascular: No hyperdense vessel or unexpected calcification. Skull: Left retromastoid craniotomy. Bulging of meninges through the defect which may have increased in the setting of elevated intracranial pressure. Sinuses/Orbits: Negative Other: Critical Value/emergent results were called by telephone at the time of interpretation on 11/06/2023 at 9:10 am to provider [facility] , who was already aware of the findings. IMPRESSION: 13 cc left occipital hematoma with intraventricular extension. Supra and infra tentorial subdural hematoma, most extensive over on the left cerebral convexity with thickness measuring up to 2 cm. 2 cm of midline shift with left uncal and subfalcine herniation. Entrapment of the right lateral ventricle. Electronically Signed   By: Tiburcio Pea M.D.   On: 11/06/2023 09:21   DG Chest Portable 1 View Result Date: 11/06/2023 CLINICAL DATA:  Intubation. EXAM: PORTABLE CHEST 1 VIEW COMPARISON:  09/20/2020. FINDINGS: Endotracheal tube tip projects 5.1 cm above the carina. Nasal/orogastric tube passes below the diaphragm, tip in the mid stomach. Cardiac silhouette is normal in size. No mediastinal or hilar masses. Lungs demonstrate thickened interstitial markings that are chronic, as well as chronic apical apical left lung base scarring. No lung consolidation to suggest pneumonia. No evidence of edema. No pleural effusion or gross pneumothorax on this supine study. IMPRESSION: 1. Well-positioned endotracheal and nasal/orogastric tubes. 2. No evidence of acute cardiopulmonary disease. Electronically Signed   By: Amie Portland M.D.   On: 11/06/2023 08:58     Assessment and Plan:   Intracranial hemorrhage: Roger Morton presents with loss of consciousness and was found to have intracranial hemorrhage with catastrophic  brain injury felt to be unsurvivable by neurosurgery and neurology.  He still has a cough reflex but otherwise does not have  any brainstem reflexes per neurology report.  As such, he does not meet brain death at this time, though his family wishes to transition to comfort care in a way that would allow for organ donation.  As part of this, I have been asked to perform a right and left heart catheterization to further assess the patient's heart.  I have spoken with Mr. Eustache's brother, Tm, who has provided consent for the procedure.  Informed Consent   Shared Decision Making/Informed Consent The risks [stroke (1 in 1000), death (1 in 1000), kidney failure [usually temporary] (1 in 500), bleeding (1 in 200), allergic reaction [possibly serious] (1 in 200)], benefits (diagnostic support and management of coronary artery disease) and alternatives of a cardiac catheterization were discussed in detail with Mr. Nordin brother, Elijah Birk, and he is willing to proceed.    For questions or updates, please contact [facility] Please consult [email or web address] for contact info under [facility].  Signed, Yvonne Kendall, MD  11/08/2023 12:55 PM

## 2023-11-08 NOTE — H&P (View-Only) (Signed)
 Cardiology Consultation   Patient ID: [name] MRN: [id number]; DOB: [birth year]  Admit date: 11/06/2023 Date of Consult: 11/08/2023  PCP:  Aldean Baker, FNP   [facility] Providers Cardiologist:  New     Patient Profile:   ARUSH GATLIFF is a 53 y.o. male with a hx of stroke with residual left hemiaplasia and hemiparesis as well as seizure disorder, hypertension, hyperlipidemia, and brain tumor, who is being seen 11/08/2023 at the request of Dr. Karna Christmas for cardiac catheterization in anticipation of organ donation.  History of Present Illness:   Mr. Petrak reportedly became unresponsive at his nursing facility two days ago.  Per his family, he was getting his vitals taken by a medical assistant and suddenly became unresponsive and pulseless.  He was complaining of dizziness and headache shortly before the event.  He was reportedly pulseless.  When EMS arrived, he was reportedly snoring but unresponsive to all stimuli.  He was transported to the ER and intubated for airway protection.  CT of the head demonstrated a large left-sided subdural hematoma as well as left occipital hemorrhage with intraventricular involvement.  Significant mass effect was noted.  Neurosurgery was consulted and felt that this represented a terminal event without possibility of recovery.  Neurology has also evaluated the patient and does not believe that his brain injury is survivable.  The family and critical care team have discussed organ donation, with the family wishing to proceed.   Past Medical History:  Diagnosis Date   Anemia    Anxiety    BPH (benign prostatic hyperplasia)    Chronic pain    Depression    Dysphagia    Essential hypertension    Essential tremor    Facial paralysis on left side    GERD (gastroesophageal reflux disease)    Hemiplegia and hemiparesis following cerebral infarction affecting left non-dominant side (HCC)    History of brain tumor     History of COVID-19 08/11/2022   Hyperlipidemia    Insomnia    Left leg weakness    S/P stroke   Neuropathy    OA (osteoarthritis)    OAB (overactive bladder)    Seizure disorder (HCC)    Sinus tachycardia    Stroke [facility])    had brain tumor, and had CVAs during operation for his brain tumor    Past Surgical History:  Procedure Laterality Date   BRAIN SURGERY  2013   brain tumor excision   COLONOSCOPY WITH PROPOFOL N/A 08/11/2021   Procedure: COLONOSCOPY WITH PROPOFOL;  Surgeon: Midge Minium, MD;  Location: [facility] ENDOSCOPY;  Service: Endoscopy;  Laterality: N/A;   INTRAMEDULLARY (IM) NAIL INTERTROCHANTERIC Left 02/15/2021   Procedure: INTRAMEDULLARY (IM) NAIL INTERTROCHANTRIC;  Surgeon: Durene Romans, MD;  Location: [facility] OR;  Service: Orthopedics;  Laterality: Left;   REVERSE SHOULDER ARTHROPLASTY Right 10/29/2022   Procedure: REVERSE SHOULDER ARTHROPLASTY;  Surgeon: Beverely Low, MD;  Location: [facility] ORS;  Service: Orthopedics;  Laterality: Right;   REVERSE SHOULDER ARTHROPLASTY Left 05/13/2023   Procedure: REVERSE SHOULDER ARTHROPLASTY;  Surgeon: Beverely Low, MD;  Location: [facility] ORS;  Service: Orthopedics;  Laterality: Left;   shunt intracranial x2       Inpatient Medications: Scheduled Meds:   stroke: early stages of recovery book   Does not apply Once   Chlorhexidine Gluconate Cloth  6 each Topical Q2000   docusate  100 mg Per Tube BID   insulin aspart  0-15 Units Subcutaneous Q4H   mouth rinse  15 mL Mouth Rinse Q2H   pantoprazole (PROTONIX) IV  40 mg Intravenous Q12H   polyethylene glycol  17 g Per Tube Daily   senna-docusate  1 tablet Per Tube BID   Continuous Infusions:  [START ON 10/26/2023] sodium chloride     ceFEPime (MAXIPIME) IV Stopped (11/08/23 1003)   fentaNYL infusion INTRAVENOUS Stopped (11/07/23 1043)   fluconazole (DIFLUCAN) IV Stopped (11/08/23 0233)   niCARDipine 7.5 mg/hr (11/08/23 1200)   propofol (DIPRIVAN) infusion 25 mcg/kg/min (11/08/23 1200)   PRN  Meds: acetaminophen **OR** acetaminophen (TYLENOL) oral liquid 160 mg/5 mL **OR** acetaminophen, fentaNYL, mouth rinse  Allergies:    Allergies  Allergen Reactions   Penicillins     Per MAR     Social History:   Unable to obtain.   Family History:   Unable to obtain.  ROS:  Unable to obtain.  Physical Exam/Data:   Vitals:   11/08/23 1015 11/08/23 1030 11/08/23 1045 11/08/23 1200  BP:      Pulse: (!) 103 (!) 103 92 96  Resp: (!) 22 (!) 22 14 (!) 24  Temp: 98.8 F (37.1 C) 98.8 F (37.1 C) 98.8 F (37.1 C) 98.4 F (36.9 C)  TempSrc:    Bladder  SpO2: 97% 97% 97% 96%  Weight:      Height:        Intake/Output Summary (Last 24 hours) at 11/08/2023 1255 Last data filed at 11/08/2023 1200 Gross per 24 hour  Intake 2694.88 ml  Output 1950 ml  Net 744.88 ml      11/08/2023    4:00 AM 11/06/2023   12:53 PM 11/06/2023    9:02 AM  Last 3 Weights  Weight (lbs) 211 lb 13.8 oz 204 lb 12.9 oz 224 lb 9.6 oz  Weight (kg) 96.1 kg 92.9 kg 101.878 kg     Body mass index is 27.2 kg/m.  General: Unresponsive.  Intubated. HEENT: Endotracheal tube in place. Neck: no JVD Vascular: Right radial A-line present.  Left radial pulse 2+. Cardiac: Regular rate and rhythm without murmurs. Lungs: Clear anteriorly. Abd: soft, nontender, no hepatomegaly  Ext: no edema Musculoskeletal:  No deformities. Skin: warm and dry  Neuro: Intubated unresponsive. Psych: Intubated and unresponsive.  EKG:  The EKG was personally reviewed and demonstrates: Sinus tachycardia with left atrial enlargement and poor R wave progression.  Relevant CV Studies: TTE (11/07/2023):  1. Left ventricular ejection fraction, by estimation, is 55 to 60%. Left  ventricular ejection fraction by 3D volume is 63 %. The left ventricle has  normal function. The left ventricle has no regional wall motion  abnormalities. There is mild left  ventricular hypertrophy. Left ventricular diastolic parameters are  consistent  with Grade I diastolic dysfunction (impaired relaxation).   2. Right ventricular systolic function is normal. The right ventricular  size is normal. Tricuspid regurgitation signal is inadequate for assessing  PA pressure.   3. The mitral valve is normal in structure. No evidence of mitral valve  regurgitation. No evidence of mitral stenosis.   4. The aortic valve is normal in structure. Aortic valve regurgitation is  not visualized. No aortic stenosis is present.   5. Aortic dilatation noted. There is borderline dilatation of the aortic  root, measuring 39 mm.   6. The inferior vena cava is normal in size with greater than 50%  respiratory variability, suggesting right atrial pressure of 3 mmHg.   Laboratory Data:  High Sensitivity Troponin:   Recent Labs  Lab 11/07/23 1101 11/07/23  1755 11/25/2023 2332 11/08/23 0529 11/08/23 1138  TROPONINIHS 30* 72* 49* 39* 32*     Chemistry Recent Labs  Lab Nov 25, 2023 2332 11/08/23 0529 11/08/23 1138  NA 137 139 141  K 3.6 3.6 3.5  CL 103 108 109  CO2 23 25 25   GLUCOSE 162* 151* 146*  BUN 25* 26* 26*  CREATININE 0.44* 0.42* 0.40*  CALCIUM 9.1 8.9 8.8*  MG 2.1 2.2 2.2  GFRNONAA >60 >60 >60  ANIONGAP 11 6 7     Recent Labs  Lab 25-Nov-2023 2332 11/08/23 0529 11/08/23 1138  PROT 7.2 7.0 7.0  ALBUMIN 3.9 3.4* 3.4*  AST 16 14* 16  ALT 13 12 10   ALKPHOS 66 64 62  BILITOT 0.9 0.7 0.9   Lipids No results for input(s): "CHOL", "TRIG", "HDL", "LABVLDL", "LDLCALC", "CHOLHDL" in the last 168 hours.  Hematology Recent Labs  Lab November 25, 2023 2332 11/08/23 0530 11/08/23 1138  WBC 20.2* 18.4* 17.9*  RBC 4.63 4.55 4.41  HGB 14.5 13.8 13.8  HCT 42.3 41.2 39.9  MCV 91.4 90.5 90.5  MCH 31.3 30.3 31.3  MCHC 34.3 33.5 34.6  RDW 13.2 13.2 13.3  PLT 245 236 232   Thyroid No results for input(s): "TSH", "FREET4" in the last 168 hours.  BNPNo results for input(s): "BNP", "PROBNP" in the last 168 hours.  DDimer No results for input(s): "DDIMER"  in the last 168 hours.   Radiology/Studies:  EEG adult Result Date: Nov 25, 2023 Rejeana Brock, MD     11-25-23  2:24 PM History: 53 year old male with massive intracranial hemorrhage, being evaluated for seizure Sedation: Fentanyl 100 mcg/h Patient State: Comatose Technique: This EEG was acquired with electrodes placed according to the International 10-20 electrode system (including Fp1, Fp2, F3, F4, C3, C4, P3, P4, O1, O2, T3, T4, T5, T6, A1, A2, Fz, Cz, Pz). The following electrodes were missing or displaced: none. Background: The background consists of low voltage irregular delta and theta range activities, with predominant activity being in the 5 to 6 Hz range.  There is an asymmetry with attenuation of faster frequencies on the left compared to the right.  There are occasional sleep spindles which are symmetric in distribution.  There is no clear reactivity to noxious stimulation. Photic stimulation: Physiologic driving is not performed EEG Abnormalities: 1) generalized irregular slow activity 2) attenuation of faster frequencies on the left compared to the right Clinical Interpretation: This EEG recorded evidence of a focal cerebral dysfunction in the left hemisphere in the setting of a more generalized nonspecific cerebral dysfunction (encephalopathy). There was no seizure or seizure predisposition recorded on this study. Please note that lack of epileptiform activity on EEG does not preclude the possibility of epilepsy. Ritta Slot, MD [facility] If 7pm- 7am, please page neurology on call as listed in AMION.  ECHOCARDIOGRAM COMPLETE Result Date: November 25, 2023    ECHOCARDIOGRAM REPORT   Patient Name:   MALAKHI MARKWOOD Date of Exam: 2023-11-25 Medical Rec #:  [id number]        Height:       74.0 in Accession #:    [id number]       Weight:       204.8 lb Date of Birth:  October 16, 1970       BSA:          2.196 m Patient Age:    52 years         BP:           114/77 mmHg Patient  Gender:  M                HR:           86 bpm. Exam Location:  [facility] Procedure: 2D Echo, Cardiac Doppler, Color Doppler and 3D Echo (Both Spectral            and Color Flow Doppler were utilized during procedure). STAT ECHO Indications:     Fever  History:         Patient has prior history of Echocardiogram examinations, most                  recent 02/19/2021. Stroke, Signs/Symptoms:Fever and Chest Pain;                  Risk Factors:Dyslipidemia.  Sonographer:     Mikki Harbor Referring Phys:  [id number] Hubbard Hartshorn OUMA Diagnosing Phys: Lorine Bears MD  Sonographer Comments: Echo performed with patient supine and on artificial respirator. IMPRESSIONS  1. Left ventricular ejection fraction, by estimation, is 55 to 60%. Left ventricular ejection fraction by 3D volume is 63 %. The left ventricle has normal function. The left ventricle has no regional wall motion abnormalities. There is mild left ventricular hypertrophy. Left ventricular diastolic parameters are consistent with Grade I diastolic dysfunction (impaired relaxation).  2. Right ventricular systolic function is normal. The right ventricular size is normal. Tricuspid regurgitation signal is inadequate for assessing PA pressure.  3. The mitral valve is normal in structure. No evidence of mitral valve regurgitation. No evidence of mitral stenosis.  4. The aortic valve is normal in structure. Aortic valve regurgitation is not visualized. No aortic stenosis is present.  5. Aortic dilatation noted. There is borderline dilatation of the aortic root, measuring 39 mm.  6. The inferior vena cava is normal in size with greater than 50% respiratory variability, suggesting right atrial pressure of 3 mmHg. FINDINGS  Left Ventricle: Left ventricular ejection fraction, by estimation, is 55 to 60%. Left ventricular ejection fraction by 3D volume is 63 %. The left ventricle has normal function. The left ventricle has no regional wall motion abnormalities. The left  ventricular internal cavity size was normal in size. There is mild left ventricular hypertrophy. Left ventricular diastolic parameters are consistent with Grade I diastolic dysfunction (impaired relaxation). Right Ventricle: The right ventricular size is normal. No increase in right ventricular wall thickness. Right ventricular systolic function is normal. Tricuspid regurgitation signal is inadequate for assessing PA pressure. Left Atrium: Left atrial size was normal in size. Right Atrium: Right atrial size was normal in size. Pericardium: There is no evidence of pericardial effusion. Mitral Valve: The mitral valve is normal in structure. No evidence of mitral valve regurgitation. No evidence of mitral valve stenosis. MV peak gradient, 3.9 mmHg. The mean mitral valve gradient is 2.0 mmHg. Tricuspid Valve: The tricuspid valve is normal in structure. Tricuspid valve regurgitation is not demonstrated. No evidence of tricuspid stenosis. Aortic Valve: The aortic valve is normal in structure. Aortic valve regurgitation is not visualized. No aortic stenosis is present. Aortic valve mean gradient measures 5.0 mmHg. Aortic valve peak gradient measures 9.3 mmHg. Aortic valve area, by VTI measures 2.81 cm. Pulmonic Valve: The pulmonic valve was normal in structure. Pulmonic valve regurgitation is not visualized. No evidence of pulmonic stenosis. Aorta: Aortic dilatation noted. There is borderline dilatation of the aortic root, measuring 39 mm. Venous: The inferior vena cava is normal in size with greater than 50% respiratory variability, suggesting right atrial pressure of  3 mmHg. IAS/Shunts: No atrial level shunt detected by color flow Doppler. Additional Comments: 3D was performed not requiring image post processing on an independent workstation and was normal.  LEFT VENTRICLE PLAX 2D LVIDd:         4.10 cm         Diastology LVIDs:         2.70 cm         LV e' medial:    11.30 cm/s LV PW:         1.20 cm         LV E/e'  medial:  5.6 LV IVS:        1.20 cm         LV e' lateral:   9.14 cm/s LVOT diam:     2.00 cm         LV E/e' lateral: 6.9 LV SV:         72 LV SV Index:   33 LVOT Area:     3.14 cm        3D Volume EF                                LV 3D EF:    Left                                             ventricul LV Volumes (MOD)                            ar LV vol d, MOD    49.8 ml                    ejection A2C:                                        fraction LV vol d, MOD    41.6 ml                    by 3D A4C:                                        volume is LV vol s, MOD    17.3 ml                    63 %. A2C: LV vol s, MOD    14.5 ml A4C:                           3D Volume EF: LV SV MOD A2C:   32.4 ml       3D EF:        63 % LV SV MOD A4C:   41.6 ml LV SV MOD BP:    31.4 ml RIGHT VENTRICLE RV Basal diam:  3.05 cm RV Mid diam:    2.50 cm RV S prime:     18.30 cm/s TAPSE (M-mode): 2.5 cm LEFT ATRIUM             Index  RIGHT ATRIUM           Index LA diam:        1.90 cm 0.87 cm/m   RA Area:     12.30 cm LA Vol (A2C):   26.1 ml 11.89 ml/m  RA Volume:   27.30 ml  12.43 ml/m LA Vol (A4C):   15.5 ml 7.06 ml/m LA Biplane Vol: 20.5 ml 9.34 ml/m  AORTIC VALVE                    PULMONIC VALVE AV Area (Vmax):    2.04 cm     PV Vmax:       1.42 m/s AV Area (Vmean):   2.34 cm     PV Peak grad:  8.1 mmHg AV Area (VTI):     2.81 cm AV Vmax:           152.50 cm/s AV Vmean:          95.500 cm/s AV VTI:            0.257 m AV Peak Grad:      9.3 mmHg AV Mean Grad:      5.0 mmHg LVOT Vmax:         99.10 cm/s LVOT Vmean:        71.100 cm/s LVOT VTI:          0.230 m LVOT/AV VTI ratio: 0.89  AORTA Ao Root diam: 3.90 cm MITRAL VALVE MV Area (PHT): 3.74 cm    SHUNTS MV Area VTI:   3.47 cm    Systemic VTI:  0.23 m MV Peak grad:  3.9 mmHg    Systemic Diam: 2.00 cm MV Mean grad:  2.0 mmHg MV Vmax:       0.99 m/s MV Vmean:      63.1 cm/s MV Decel Time: 203 msec MV E velocity: 63.00 cm/s MV A velocity: 85.60 cm/s MV E/A  ratio:  0.74 Lorine Bears MD Electronically signed by Lorine Bears MD Signature Date/Time: 11/07/2023/9:59:46 AM    Final    CT CHEST ABDOMEN PELVIS W CONTRAST Result Date: 11/07/2023 CLINICAL DATA:  Potential organ donor EXAM: CT CHEST, ABDOMEN, AND PELVIS WITH CONTRAST TECHNIQUE: Multidetector CT imaging of the chest, abdomen and pelvis was performed following the standard protocol during bolus administration of intravenous contrast. RADIATION DOSE REDUCTION: This exam was performed according to the departmental dose-optimization program which includes automated exposure control, adjustment of the mA and/or kV according to patient size and/or use of iterative reconstruction technique. CONTRAST:  OMNIPAQUE IOHEXOL 300 MG/ML  SOLN COMPARISON:  Chest radiograph 11/06/2023 and CT chest 08/21/2021 FINDINGS: CT CHEST FINDINGS Cardiovascular: Normal heart size. No pericardial effusion. Normal caliber aorta. Mediastinum/Nodes: Endotracheal tube tip in the intrathoracic trachea. Enteric tube tip in the stomach. No thoracic adenopathy. Lungs/Pleura: Paraseptal and centrilobular emphysema greatest in the upper lobes. Multiple bilateral pulmonary nodules many of which demonstrate calcification. These are unchanged from 08/21/2021 and compatible with benign granulomas. Centrilobular micro nodules and tree-in-bud opacities with band like consolidation in the left lower lobe. No pleural effusion or pneumothorax. Musculoskeletal: Bilateral reverse TSAs. No acute fracture or destructive osseous lesion. CT ABDOMEN PELVIS FINDINGS Hepatobiliary: No focal hepatic lesion. Gallbladder and biliary tree are unremarkable. Pancreas: Unremarkable. No pancreatic ductal dilatation or surrounding inflammatory changes. Spleen: Unremarkable. Adrenals/Urinary Tract: Normal adrenal glands. Simple cysts in the left kidney. Urinary calculi or hydronephrosis. Foley catheter and gas within the nondistended bladder. Stomach/Bowel: Rectal  tube. No  bowel obstruction or bowel wall thickening. Normal appendix. Stomach is within normal limits. Vascular/Lymphatic: Aortic atherosclerotic calcification. No lymphadenopathy. Reproductive: Unremarkable. Other: No free intraperitoneal fluid or air. VP shunt catheter with tip terminating in the low pelvis. Musculoskeletal: IM rod and screw fixation left femur. No acute fracture. No destructive osseous lesion. A vascular necrosis both femoral heads. IMPRESSION: 1. Centrilobular micro nodules and tree-in-bud opacities with band like consolidation in the left lower lobe. Findings are compatible with aspiration and/or pneumonia. 2. No acute abnormality in the abdomen or pelvis. Electronically Signed   By: Minerva Fester M.D.   On: 11/07/2023 01:58   CT Head Wo Contrast Result Date: 11/06/2023 CLINICAL DATA:  Altered mental status EXAM: CT HEAD WITHOUT CONTRAST TECHNIQUE: Contiguous axial images were obtained from the base of the skull through the vertex without intravenous contrast. RADIATION DOSE REDUCTION: This exam was performed according to the departmental dose-optimization program which includes automated exposure control, adjustment of the mA and/or kV according to patient size and/or use of iterative reconstruction technique. COMPARISON:  01/14/2021 FINDINGS: Brain: Acute hematoma in the left occipital lobe measuring 38 x 18 x 37 mm. There is intraventricular extension with blood clot in the lateral ventricles. Medially there is contact with the inter hemispheric fissure, presumably the source of a large and generalized subdural hematoma along the left cerebral convexity which measures up to 19 mm laterally on the left. Thinner subdural hematoma also involves the inter hemispheric fissure, right tentorium, and retro clival locations. Pronounced midline shift measuring 2 cm with asymmetric dilatation/entrapment of the right lateral ventricle. Left retromastoid craniotomy with calcified mass at the left CP  angle cistern, known. VP shunt from a right frontal approach, tip near but not immediately at the level of parenchymal hemorrhage, coarse distorted by the brain shift. Vascular: No hyperdense vessel or unexpected calcification. Skull: Left retromastoid craniotomy. Bulging of meninges through the defect which may have increased in the setting of elevated intracranial pressure. Sinuses/Orbits: Negative Other: Critical Value/emergent results were called by telephone at the time of interpretation on 11/06/2023 at 9:10 am to provider [facility] , who was already aware of the findings. IMPRESSION: 13 cc left occipital hematoma with intraventricular extension. Supra and infra tentorial subdural hematoma, most extensive over on the left cerebral convexity with thickness measuring up to 2 cm. 2 cm of midline shift with left uncal and subfalcine herniation. Entrapment of the right lateral ventricle. Electronically Signed   By: Tiburcio Pea M.D.   On: 11/06/2023 09:21   DG Chest Portable 1 View Result Date: 11/06/2023 CLINICAL DATA:  Intubation. EXAM: PORTABLE CHEST 1 VIEW COMPARISON:  09/20/2020. FINDINGS: Endotracheal tube tip projects 5.1 cm above the carina. Nasal/orogastric tube passes below the diaphragm, tip in the mid stomach. Cardiac silhouette is normal in size. No mediastinal or hilar masses. Lungs demonstrate thickened interstitial markings that are chronic, as well as chronic apical apical left lung base scarring. No lung consolidation to suggest pneumonia. No evidence of edema. No pleural effusion or gross pneumothorax on this supine study. IMPRESSION: 1. Well-positioned endotracheal and nasal/orogastric tubes. 2. No evidence of acute cardiopulmonary disease. Electronically Signed   By: Amie Portland M.D.   On: 11/06/2023 08:58     Assessment and Plan:   Intracranial hemorrhage: Mr. Orona presents with loss of consciousness and was found to have intracranial hemorrhage with catastrophic  brain injury felt to be unsurvivable by neurosurgery and neurology.  He still has a cough reflex but otherwise does not have  any brainstem reflexes per neurology report.  As such, he does not meet brain death at this time, though his family wishes to transition to comfort care in a way that would allow for organ donation.  As part of this, I have been asked to perform a right and left heart catheterization to further assess the patient's heart.  I have spoken with Mr. Eustache's brother, Tm, who has provided consent for the procedure.  Informed Consent   Shared Decision Making/Informed Consent The risks [stroke (1 in 1000), death (1 in 1000), kidney failure [usually temporary] (1 in 500), bleeding (1 in 200), allergic reaction [possibly serious] (1 in 200)], benefits (diagnostic support and management of coronary artery disease) and alternatives of a cardiac catheterization were discussed in detail with Mr. Nordin brother, Elijah Birk, and he is willing to proceed.    For questions or updates, please contact [facility] Please consult [email or web address] for contact info under [facility].  Signed, Yvonne Kendall, MD  11/08/2023 12:55 PM

## 2023-11-08 NOTE — Progress Notes (Signed)
 NAME:  Roger Morton, MRN:  [id number], DOB:  1970-12-09, LOS: 2 ADMISSION DATE:  11/06/2023, CONSULTATION DATE: 11/06/2023 REFERRING MD: Dr. Lenard Lance, CHIEF COMPLAINT: Altered Mental Status   History of Present Illness:  This is a 53 yo male who presented to [facility] ER on 03/16 via EMS from [facility] with altered mental status.  Per ER notes EMS called out to [employer] for "unknown medical."   When EMS arrived on the scene pt noted to have snoring respirations and unresponsive to all stimuli.  Facility staff reported prior to pt becoming unresponsiveness pt c/o dizziness.  Pt placed on NRB and a nasopharyngeal airway prior to EMS transport.    ED Course  Upon arrival to the ER pt on a NRB with snoring respirations, unresponsive, with dilated/non reactive pupils.  Pt with known hx of seizure disorder, prior CVA with left-sided hemiparesis, and receives outpatient opioid medications.  EDP ordered 2 mg of iv ativan, following administration mentation remained unchanged.  Due to inability to protect airway pt mechanically intubated.  CT Head revealed massive left-sided subdural hematoma, intraparenchymal hematoma; retroclival hematoma causing ventral compression of the brainstem; and intraventricular hematoma.  Neurosurgeon Dr. [name] consulted and reviewed imaging and performed a physical examination.  Per Dr. [name] findings represented a terminal event/hemorrhage and surgical procedures would be futile.  He recommended continuing supportive care from a medical standpoint until further decisions regarding goals of care were discussed or pt progresses to formal diagnosis of brain death. Dr. [name] attempted to contact pts family multiple times utilizing phone numbers listed in epic and provided by [employer], however family unreachable.  PCCM team contacted for ICU admission.  Significant lab results: Glucose 182/creatinine 0.55/calcium 8.4/wbc 12.6  CXR: Well-positioned  endotracheal and nasal/orogastric tubes. No evidence of acute cardiopulmonary disease.  CT Head: 13 cc left occipital hematoma with intraventricular extension. Supra and infra tentorial subdural hematoma, most extensive over on the left cerebral convexity with thickness measuring up to 2 cm. 2 cm of midline shift with left uncal and subfalcine herniation. Entrapment of the right lateral ventricle.  11/08/23- patient had urine culture with enterococcus faecalis, was asked by [facility] to provide therapy.  Have initiated BID zyvox.  Plan for OR in am  Pertinent  Medical History  Anemia  Anxiety  BPH Chronic Pain  Depression  Essential HTN  Essential Tremor  GERD  Stroke with Residual Left Hemiplegia and Hemiparesis  Brain Tumor COVID-19 HLD  Osteoarthritis  Insomnia  Neuropathy  Overactive Bladder  Seizure Disorder   Significant Hospital Events: Including procedures, antibiotic start and stop dates in addition to other pertinent events   03/16: Admitted to ICU with terminal subdural and intraparenchymal hemorrhage requiring mechanical intubation for airway protection  11/07/23- patient s/p eval by [facility] for organ sharing network.  S/p TTE and EEG is in process. Reviewed case with neurologist Dr [name].  Family support staff is working with family at bedside for organ procurement. 11/08/23- patient s/p neurology eval and cardiology eval.  Family in comunication with [facility] regarding comfort measures and organ donation  Interim History / Subjective:  Pt mechanically intubated vent settings: FiO2 100%/PEEP 8.  Pt unresponsive/not breathing over the ventilator/gag reflexes present/no corneal reflexes.    Objective   Blood pressure 132/84, pulse 99, temperature 98.8 F (37.1 C), resp. rate (!) 22, height 6\' 2"  (1.88 m), weight 96.1 kg, SpO2 97%.    Vent Mode: PRVC FiO2 (%):  [50 %-100 %] 50 %  Set Rate:  [20 bmp] 20 bmp Vt Set:  [550 mL] 550 mL PEEP:  [5 cmH20] 5  cmH20 Plateau Pressure:  [15 cmH20] 15 cmH20   Intake/Output Summary (Last 24 hours) at 11/08/2023 0950 Last data filed at 11/08/2023 0745 Gross per 24 hour  Intake 2392.4 ml  Output 1475 ml  Net 917.4 ml   Filed Weights   11/06/23 0902 11/06/23 1253 11/08/23 0400  Weight: 101.9 kg 92.9 kg 96.1 kg   Examination: General: Acute on chronically-ill appearing male, NAD mechanically intubated  HENT: Supple, no JVD  Lungs: Faint rhonchi, throughout, even, non labored  Cardiovascular: NSR, s1s2, no m/r/g, 2+ radial/1+ distal, 1+generalized edema  Abdomen: +BS x4, obese, soft, non distended  Extremities: Not moving extremities  Neuro:  not following commands or withdrawing from painful stimulation, pt irregular non reactive, no corneal reflexes present, absent cough but no gag reflex present  GU: Indwelling foley catheter draining yellow urine   Resolved Hospital Problem list     Assessment & Plan:   #Massive left-sided subdural hematoma, intraparenchymal hematoma, retroclival hematoma causing ventral compression of the brainstem, and intraventricular hematoma #Mechanical intubation pain/discomfort  Hx: Brain tumor, seizure disorder, stroke with residual left hemiplegia and hemiparesis, and essential tremor  - Neurosurgery consulted appreciate input: CT findings revealed terminal intracranial hemorrhage and surgical interventions deemed futile  - Neuro checks per NIH stroke scale protocol  - Avoid sedating medications as able  - Maintain RASS goal 0  - PAD to maintain RASS goal: Prn propofol  - Seizure precautions - EEG pending  - Nicardipine gtt to maintain sbp 140 or less   #Mildly elevated troponin secondary to demand ischemia  - Continuous telemetry monitoring  - Trend troponin until peaked  - Nicardipine gtt to maintain sbp 140 or less   #Acute respiratory failure #Mechanical intubation for airway protection due to catastrophic intracranial hemorrhage  - Full vent support  for now: vent settings reviewed and established  - Continue lung protective strategies  - Maintain plateau pressures less than 30 cm H2O - Intermittent CXR and ABG's - VAP bundle implemented   E faecalis UTI - Zyvox  #GERD - PPI  #Hyperglycemia - Hemoglobin A1c pending   - CBG's q4hrs  - SSI - Follow hyper/hypoglycemic protocol  - Target CBG range 140 to 180   Best Practice (right click and "Reselect all SmartList Selections" daily)   Diet/type: NPO DVT prophylaxis SCD Pressure ulcer(s): N/A GI prophylaxis: PPI Lines: N/A Foley:  Yes, and it is still needed Code Status:  full code Last date of multidisciplinary goals of care discussion [N/A]  Labs   CBC: Recent Labs  Lab 11/06/23 0836 11/07/23 0018 11/07/23 0526 11/07/23 1101 11/07/23 1755 11/07/23 2332 11/08/23 0530  WBC 12.6* 20.5* 23.0* 22.8* 22.1* 20.2* 18.4*  NEUTROABS 6.0 17.6*  --   --   --   --   --   HGB 16.3 16.5 15.8 15.3 14.8 14.5 13.8  HCT 48.9 47.4 46.8 45.6 43.1 42.3 41.2  MCV 92.3 90.1 91.6 90.3 88.9 91.4 90.5  PLT 306 250 243 241 262 245 236    Basic Metabolic Panel: Recent Labs  Lab 11/07/23 1004 11/07/23 1114 11/07/23 1755 11/07/23 2332 11/08/23 0529  NA 142 144 139 137 139  K 4.0 4.2 3.7 3.6 3.6  CL 108 110 105 103 108  CO2 19* 25 24 23 25   GLUCOSE 160* 159* 141* 162* 151*  BUN 21* 23* 26* 25* 26*  CREATININE 0.61  0.53* 0.48* 0.44* 0.42*  CALCIUM 9.1 9.3 9.2 9.1 8.9  MG 1.9 2.1 2.0 2.1 2.2  PHOS 2.6 2.7 2.2* 2.5 2.2*   GFR: Estimated Creatinine Clearance: 122.7 mL/min (A) (by C-G formula based on SCr of 0.42 mg/dL (L)). Recent Labs  Lab 11/07/23 1101 11/07/23 1755 11/07/23 2332 11/08/23 0530  WBC 22.8* 22.1* 20.2* 18.4*    Liver Function Tests: Recent Labs  Lab 11/07/23 1004 11/07/23 1114 11/07/23 1755 11/07/23 2332 11/08/23 0529  AST 20 19 18 16  14*  ALT 15 15 15 13 12   ALKPHOS 71 73 71 66 64  BILITOT 0.9 0.6 0.8 0.9 0.7  PROT 7.3 7.3 7.2 7.2 7.0  ALBUMIN  3.7 3.8 3.7 3.9 3.4*   Recent Labs  Lab 11/07/23 0018 11/07/23 1004 11/08/23 0529  LIPASE 23 20 21   AMYLASE 43 41 33   No results for input(s): "AMMONIA" in the last 168 hours.  ABG    Component Value Date/Time   PHART 7.47 (H) 11/08/2023 0830   PCO2ART 39 11/08/2023 0830   PO2ART 93 11/08/2023 0830   HCO3 28.4 (H) 11/08/2023 0830   ACIDBASEDEF 0.1 11/07/2023 0247   O2SAT 99.2 11/08/2023 0830     Coagulation Profile: Recent Labs  Lab 11/07/23 0526 11/07/23 1101 11/07/23 1755 11/07/23 2332 11/08/23 0529  INR 1.0 1.1 1.1 1.1 1.0    Cardiac Enzymes: Recent Labs  Lab 11/07/23 0018 11/07/23 1101 11/07/23 2332  CKTOTAL 75 49 38*    HbA1C: Hgb A1c MFr Bld  Date/Time Value Ref Range Status  11/06/2023 01:04 PM 5.4 4.8 - 5.6 % Final    Comment:    (NOTE) Pre diabetes:          5.7%-6.4%  Diabetes:              >6.4%  Glycemic control for   <7.0% adults with diabetes     CBG: Recent Labs  Lab 11/07/23 1523 11/07/23 1954 11/07/23 2331 11/08/23 0346 11/08/23 0719  GLUCAP 175* 133* 153* 149* 135*    Review of Systems:   Unable to assess pt mechanically intubated   Past Medical History:  He,  has a past medical history of Anemia, Anxiety, BPH (benign prostatic hyperplasia), Chronic pain, Depression, Dysphagia, Essential hypertension, Essential tremor, Facial paralysis on left side, GERD (gastroesophageal reflux disease), Hemiplegia and hemiparesis following cerebral infarction affecting left non-dominant side (HCC), History of brain tumor, History of COVID-19 (08/11/2022), Hyperlipidemia, Insomnia, Left leg weakness, Neuropathy, OA (osteoarthritis), OAB (overactive bladder), Seizure disorder (HCC), Sinus tachycardia, and Stroke (HCC).   Surgical History:   Past Surgical History:  Procedure Laterality Date   BRAIN SURGERY  2013   brain tumor excision   COLONOSCOPY WITH PROPOFOL N/A 08/11/2021   Procedure: COLONOSCOPY WITH PROPOFOL;  Surgeon: Midge Minium, MD;  Location: [facility] ENDOSCOPY;  Service: Endoscopy;  Laterality: N/A;   INTRAMEDULLARY (IM) NAIL INTERTROCHANTERIC Left 02/15/2021   Procedure: INTRAMEDULLARY (IM) NAIL INTERTROCHANTRIC;  Surgeon: Durene Romans, MD;  Location: [facility] OR;  Service: Orthopedics;  Laterality: Left;   REVERSE SHOULDER ARTHROPLASTY Right 10/29/2022   Procedure: REVERSE SHOULDER ARTHROPLASTY;  Surgeon: Beverely Low, MD;  Location: [facility] ORS;  Service: Orthopedics;  Laterality: Right;   REVERSE SHOULDER ARTHROPLASTY Left 05/13/2023   Procedure: REVERSE SHOULDER ARTHROPLASTY;  Surgeon: Beverely Low, MD;  Location: [facility] ORS;  Service: Orthopedics;  Laterality: Left;   shunt intracranial x2       Social History:   reports that he has been smoking cigarettes. He has  a 25 pack-year smoking history. He has never used smokeless tobacco. He reports that he does not currently use alcohol. He reports that he does not currently use drugs.   Family History:  His family history is negative for CAD.   Allergies Allergies  Allergen Reactions   Penicillins     Per MAR      Home Medications  Prior to Admission medications   Medication Sig Start Date End Date Taking? Authorizing Provider  acetaminophen (TYLENOL) 325 MG tablet Take 650 mg by mouth 2 (two) times daily. May take an additional 650 mg if needed every 4 hours for pain    [provider]  aspirin 81 MG chewable tablet Chew 81 mg by mouth daily.    [provider]  atorvastatin (LIPITOR) 40 MG tablet Take 40 mg by mouth daily at 4 PM.    [provider]  azelastine (ASTELIN) 0.1 % nasal spray Place 2 sprays into both nostrils 2 (two) times daily. Use in each nostril as directed    [provider]  baclofen (LIORESAL) 20 MG tablet Take 20 mg by mouth in the morning, at noon, and at bedtime. 05/20/21   [provider]  busPIRone (BUSPAR) 10 MG tablet Take 10 mg by mouth 3 (three) times daily.    [provider]   carboxymethylcellulose (ARTIFICIAL TEARS) 1 % ophthalmic solution Place 1 drop into both eyes in the morning, at noon, in the evening, and at bedtime.    [provider]  carboxymethylcellulose 1 % ophthalmic solution Place 1 drop into the left eye at bedtime. Refresh liquigel    [provider]  celecoxib (CELEBREX) 200 MG capsule Take 1 capsule (200 mg total) by mouth daily. 05/14/21   Heather Roberts, NP  clonazePAM (KLONOPIN) 0.25 MG disintegrating tablet Take 0.25 mg by mouth 2 (two) times daily. 04/13/23   [provider]  diclofenac Sodium (VOLTAREN) 1 % GEL Apply 1 g topically 3 (three) times daily. Apply to left hips topically    [provider]  fluticasone (FLONASE) 50 MCG/ACT nasal spray Place 1 spray into both nostrils daily. 05/20/21   [provider]  gabapentin (NEURONTIN) 300 MG capsule Take 300-900 mg by mouth See admin instructions. Take 300 mg in the morning and 900 mg at bedtime    [provider]  ibuprofen (ADVIL) 400 MG tablet Take 400 mg by mouth every 8 (eight) hours as needed (Back pain).    [provider]  Lidocaine 4 % PTCH Apply 1 patch topically daily. Apply to right shoulder for 12 hours max dose =3 patches remove per schedule 05/11/21   [provider]  melatonin 5 MG TABS Take 5 mg by mouth at bedtime.    [provider]  mirabegron ER (MYRBETRIQ) 25 MG TB24 tablet Take 1 tablet (25 mg total) by mouth daily. 06/30/22   Stoioff, Verna Czech, MD  nortriptyline (PAMELOR) 10 MG capsule Take 10-50 mg by mouth See admin instructions. Take 10 mg in the morning and 50 ng at bedtime    [provider]  Omega-3 Fatty Acids (FISH OIL) 1000 MG CAPS Take 1,000 mg by mouth daily.    [provider]  oxycodone (OXY-IR) 5 MG capsule Take 1 capsule (5 mg total) by mouth every 6 (six) hours as needed for pain. 05/13/23   Beverely Low, MD  pantoprazole (PROTONIX) 20 MG tablet Take 20 mg by mouth  every morning. 0700    [provider]  Polyethyl Glycol-Propyl Glycol (SYSTANE) 0.4-0.3 % SOLN Place 1 drop into both eyes at bedtime. There is a 2nd order in [facility] stating "Instill 1 drop into left eye at bedtime for exposure keratitis"    [provider]  propranolol (INDERAL) 10 MG tablet Take 10 mg by mouth 2 (two) times daily.    [provider]  QUEtiapine (SEROQUEL) 200 MG tablet Take 200 mg by mouth at bedtime. 07/29/22   [provider]  senna-docusate (SENOKOT-S) 8.6-50 MG tablet Take 2 tablets by mouth at bedtime. 02/18/21   Marguerita Merles Latif, DO  tamsulosin (FLOMAX) 0.4 MG CAPS capsule Take 0.4 mg by mouth daily. 08/20/20   [provider]     Critical care provider statement:   Total critical care time: 33 minutes   Performed by: Karna Christmas MD   Critical care time was exclusive of separately billable procedures and treating other patients.   Critical care was necessary to treat or prevent imminent or life-threatening deterioration.   Critical care was time spent personally by me on the following activities: development of treatment plan with patient and/or surrogate as well as nursing, discussions with consultants, evaluation of patient's response to treatment, examination of patient, obtaining history from patient or surrogate, ordering and performing treatments and interventions, ordering and review of laboratory studies, ordering and review of radiographic studies, pulse oximetry and re-evaluation of patient's condition.    Vida Rigger, M.D.  Pulmonary & Critical Care Medicine

## 2023-11-08 NOTE — Plan of Care (Signed)
  Problem: Education: Goal: Knowledge of disease or condition will improve Outcome: Not Progressing Goal: Knowledge of secondary prevention will improve (MUST DOCUMENT ALL) Outcome: Not Progressing Goal: Knowledge of patient specific risk factors will improve (DELETE if not current risk factor) Outcome: Not Progressing   Problem: Intracerebral Hemorrhage Tissue Perfusion: Goal: Complications of Intracerebral Hemorrhage will be minimized Outcome: Not Progressing   Problem: Coping: Goal: Will verbalize positive feelings about self Outcome: Not Progressing Goal: Will identify appropriate support needs Outcome: Not Progressing   Problem: Health Behavior/Discharge Planning: Goal: Ability to manage health-related needs will improve Outcome: Not Progressing Goal: Goals will be collaboratively established with patient/family Outcome: Not Progressing   Problem: Self-Care: Goal: Ability to participate in self-care as condition permits will improve Outcome: Not Progressing Goal: Verbalization of feelings and concerns over difficulty with self-care will improve Outcome: Not Progressing Goal: Ability to communicate needs accurately will improve Outcome: Not Progressing   Problem: Nutrition: Goal: Risk of aspiration will decrease Outcome: Not Progressing Goal: Dietary intake will improve Outcome: Not Progressing   Problem: Education: Goal: Knowledge of General Education information will improve Description: Including pain rating scale, medication(s)/side effects and non-pharmacologic comfort measures Outcome: Not Progressing   Problem: Health Behavior/Discharge Planning: Goal: Ability to manage health-related needs will improve Outcome: Not Progressing   Problem: Clinical Measurements: Goal: Ability to maintain clinical measurements within normal limits will improve Outcome: Not Progressing Goal: Will remain free from infection Outcome: Not Progressing Goal: Diagnostic test  results will improve Outcome: Not Progressing Goal: Respiratory complications will improve Outcome: Not Progressing Goal: Cardiovascular complication will be avoided Outcome: Not Progressing   Problem: Activity: Goal: Risk for activity intolerance will decrease Outcome: Not Progressing   Problem: Nutrition: Goal: Adequate nutrition will be maintained Outcome: Not Progressing   Problem: Coping: Goal: Level of anxiety will decrease Outcome: Not Progressing   Problem: Elimination: Goal: Will not experience complications related to bowel motility Outcome: Not Progressing Goal: Will not experience complications related to urinary retention Outcome: Not Progressing

## 2023-11-09 ENCOUNTER — Encounter: Admission: EM | Disposition: E | Payer: Self-pay | Source: Home / Self Care | Attending: Pulmonary Disease

## 2023-11-09 ENCOUNTER — Encounter: Payer: Self-pay | Admitting: Internal Medicine

## 2023-11-09 ENCOUNTER — Inpatient Hospital Stay: Admission: RE | Admit: 2023-11-09 | Payer: 59 | Source: Ambulatory Visit

## 2023-11-09 ENCOUNTER — Encounter: Payer: Self-pay | Admitting: Anesthesiology

## 2023-11-09 HISTORY — PX: ORGAN PROCUREMENT: SHX5270

## 2023-11-09 LAB — PREPARE RBC (CROSSMATCH)

## 2023-11-09 LAB — URINALYSIS, COMPLETE (UACMP) WITH MICROSCOPIC
Bacteria, UA: NONE SEEN
Bilirubin Urine: NEGATIVE
Glucose, UA: NEGATIVE mg/dL
Ketones, ur: 20 mg/dL — AB
Nitrite: NEGATIVE
Protein, ur: NEGATIVE mg/dL
Specific Gravity, Urine: 1.016 (ref 1.005–1.030)
Squamous Epithelial / HPF: 0 /HPF (ref 0–5)
pH: 6 (ref 5.0–8.0)

## 2023-11-09 LAB — BLOOD GAS, ARTERIAL
Acid-Base Excess: 4.5 mmol/L — ABNORMAL HIGH (ref 0.0–2.0)
Acid-Base Excess: 0.8 mmol/L (ref 0.0–2.0)
Bicarbonate: 28.4 mmol/L — ABNORMAL HIGH (ref 20.0–28.0)
Bicarbonate: 24.2 mmol/L (ref 20.0–28.0)
FIO2: 50 %
FIO2: 40 %
MECHVT: 550 mL
MECHVT: 550 mL
Mechanical Rate: 20
Mechanical Rate: 20
O2 Saturation: 99.4 %
O2 Saturation: 98.1 %
PEEP: 8 cmH2O
PEEP: 8 cmH2O
Patient temperature: 37
Patient temperature: 37
pCO2 arterial: 39 mmHg (ref 32–48)
pCO2 arterial: 34 mmHg (ref 32–48)
pH, Arterial: 7.47 — ABNORMAL HIGH (ref 7.35–7.45)
pH, Arterial: 7.46 — ABNORMAL HIGH (ref 7.35–7.45)
pO2, Arterial: 83 mmHg (ref 83–108)
pO2, Arterial: 75 mmHg — ABNORMAL LOW (ref 83–108)

## 2023-11-09 LAB — PROTIME-INR
INR: 1 (ref 0.8–1.2)
INR: 1.1 (ref 0.8–1.2)
Prothrombin Time: 13.8 s (ref 11.4–15.2)
Prothrombin Time: 14.5 s (ref 11.4–15.2)

## 2023-11-09 LAB — COMPREHENSIVE METABOLIC PANEL
ALT: 12 U/L (ref 0–44)
AST: 16 U/L (ref 15–41)
Albumin: 2.9 g/dL — ABNORMAL LOW (ref 3.5–5.0)
Alkaline Phosphatase: 63 U/L (ref 38–126)
Anion gap: 4 — ABNORMAL LOW (ref 5–15)
BUN: 20 mg/dL (ref 6–20)
CO2: 25 mmol/L (ref 22–32)
Calcium: 8.3 mg/dL — ABNORMAL LOW (ref 8.9–10.3)
Chloride: 113 mmol/L — ABNORMAL HIGH (ref 98–111)
Creatinine, Ser: 0.37 mg/dL — ABNORMAL LOW (ref 0.61–1.24)
GFR, Estimated: >60 mL/min (ref >60)
Glucose, Bld: 113 mg/dL — ABNORMAL HIGH (ref 70–99)
Potassium: 3.7 mmol/L (ref 3.5–5.1)
Sodium: 142 mmol/L (ref 135–145)
Total Bilirubin: 0.5 mg/dL (ref 0.0–1.2)
Total Protein: 6.5 g/dL (ref 6.5–8.1)

## 2023-11-09 LAB — GLUCOSE, CAPILLARY
Glucose-Capillary: 130 mg/dL — ABNORMAL HIGH (ref 70–99)
Glucose-Capillary: 101 mg/dL — ABNORMAL HIGH (ref 70–99)

## 2023-11-09 LAB — PHOSPHORUS: Phosphorus: 2.4 mg/dL — ABNORMAL LOW (ref 2.5–4.6)

## 2023-11-09 LAB — APTT
aPTT: 35 s (ref 24–36)
aPTT: 33 s (ref 24–36)

## 2023-11-09 LAB — CBC
HCT: 37.4 % — ABNORMAL LOW (ref 39.0–52.0)
Hemoglobin: 12.6 g/dL — ABNORMAL LOW (ref 13.0–17.0)
MCH: 30.7 pg (ref 26.0–34.0)
MCHC: 33.7 g/dL (ref 30.0–36.0)
MCV: 91.2 fL (ref 80.0–100.0)
Platelets: 219 10*3/uL (ref 150–400)
RBC: 4.1 MIL/uL — ABNORMAL LOW (ref 4.22–5.81)
RDW: 13.6 % (ref 11.5–15.5)
WBC: 15.3 10*3/uL — ABNORMAL HIGH (ref 4.0–10.5)
nRBC: 0 % (ref 0.0–0.2)

## 2023-11-09 LAB — URINE CULTURE

## 2023-11-09 LAB — FIBRINOGEN
Fibrinogen: >800 mg/dL — ABNORMAL HIGH (ref 210–475)
Fibrinogen: >800 mg/dL — ABNORMAL HIGH (ref 210–475)

## 2023-11-09 LAB — TROPONIN I (HIGH SENSITIVITY)
Troponin I (High Sensitivity): 18 ng/L — ABNORMAL HIGH (ref <18)
Troponin I (High Sensitivity): 15 ng/L (ref <18)

## 2023-11-09 LAB — CK: Total CK: 30 U/L — ABNORMAL LOW (ref 49–397)

## 2023-11-09 LAB — MAGNESIUM: Magnesium: 2.3 mg/dL (ref 1.7–2.4)

## 2023-11-09 LAB — BILIRUBIN, DIRECT: Bilirubin, Direct: 0.1 mg/dL (ref 0.0–0.2)

## 2023-11-09 SURGERY — SURGICAL PROCUREMENT, ORGAN
Site: Abdomen

## 2023-11-09 MED ORDER — SODIUM CHLORIDE 0.9% FLUSH: 3.0000 mL | INTRAVENOUS | Status: DC | PRN

## 2023-11-09 MED ORDER — HALOPERIDOL LACTATE 5 MG/ML IJ SOLN
2.5000 mg | INTRAMUSCULAR | Status: DC | PRN
  Administered 2023-11-09: 5 mg via INTRAVENOUS
  Filled 2023-11-09: qty 1

## 2023-11-09 MED ORDER — SODIUM CHLORIDE 0.9% IV SOLUTION: Freq: Once | INTRAVENOUS | Status: DC

## 2023-11-09 MED ORDER — HEPARIN SODIUM (PORCINE) 1000 UNIT/ML IJ SOLN
29430.0000 [IU] | Freq: Once | INTRAMUSCULAR | Status: AC
  Administered 2023-11-09: 29430 [IU] via INTRAVENOUS
  Filled 2023-11-09 (×2): qty 29.45

## 2023-11-09 MED ORDER — IOHEXOL 300 MG/ML  SOLN
INTRAMUSCULAR | Status: DC | PRN
  Administered 2023-11-08: 52 mL

## 2023-11-09 MED ORDER — SODIUM CHLORIDE 0.9% FLUSH: 3.0000 mL | Freq: Two times a day (BID) | INTRAVENOUS | Status: DC

## 2023-11-09 MED ORDER — POLYVINYL ALCOHOL 1.4 % OP SOLN: 1.0000 [drp] | Freq: Four times a day (QID) | OPHTHALMIC | Status: DC | PRN

## 2023-11-09 MED ORDER — MORPHINE SULFATE (PF) 2 MG/ML IV SOLN
2.0000 mg | INTRAVENOUS | Status: DC | PRN
Start: 2023-11-09 — End: 2023-11-10
  Administered 2023-11-09: 2 mg via INTRAVENOUS
  Administered 2023-11-09: 4 mg via INTRAVENOUS

## 2023-11-09 MED ORDER — MORPHINE SULFATE (PF) 2 MG/ML IV SOLN
2.0000 mg | INTRAVENOUS | Status: DC | PRN
  Filled 2023-11-09 (×5): qty 2

## 2023-11-09 MED ORDER — GLYCOPYRROLATE 0.2 MG/ML IJ SOLN
0.2000 mg | INTRAMUSCULAR | Status: DC | PRN
  Filled 2023-11-09: qty 1

## 2023-11-09 MED ORDER — LORAZEPAM 2 MG/ML IJ SOLN
2.0000 mg | INTRAMUSCULAR | Status: DC | PRN
  Administered 2023-11-09: 2 mg via INTRAVENOUS
  Filled 2023-11-09: qty 2

## 2023-11-09 MED ORDER — POTASSIUM CHLORIDE 10 MEQ/100ML IV SOLN
10.0000 meq | INTRAVENOUS | Status: AC
  Administered 2023-11-09 (×4): 10 meq via INTRAVENOUS
  Filled 2023-11-09 (×4): qty 100

## 2023-11-09 MED ORDER — K PHOS MONO-SOD PHOS DI & MONO 155-852-130 MG PO TABS
500.0000 mg | ORAL_TABLET | ORAL | Status: DC
  Administered 2023-11-09: 500 mg
  Filled 2023-11-09 (×2): qty 2

## 2023-11-09 SURGICAL SUPPLY — 71 items
BLADE CLIPPER SURG (BLADE) ×1 IMPLANT
BLADE STERNUM SYSTEM 6 (BLADE) ×1 IMPLANT
BLADE SURG SZ10 CARB STEEL (BLADE) IMPLANT
CHLORAPREP W/TINT 26 (MISCELLANEOUS) ×2 IMPLANT
CLIP TI MEDIUM 6 (CLIP) ×1 IMPLANT
CLIP TI WIDE RED SMALL 6 (CLIP) ×1 IMPLANT
CNTNR URN SCR LID CUP LEK RST (MISCELLANEOUS) ×1 IMPLANT
COVER BACK TABLE 60X90IN (DRAPES) ×1 IMPLANT
COVER LIGHT HANDLE STERIS (MISCELLANEOUS) IMPLANT
COVER MAYO STAND STRL (DRAPES) IMPLANT
DRAPE LAPAROTOMY 100X77 ABD (DRAPES) ×1 IMPLANT
DRAPE SHEET LG 3/4 BI-LAMINATE (DRAPES) IMPLANT
DRSG OPSITE POSTOP 4X12 (GAUZE/BANDAGES/DRESSINGS) IMPLANT
DRSG OPSITE POSTOP 4X14 (GAUZE/BANDAGES/DRESSINGS) IMPLANT
DRSG OPSITE POSTOP 4X8 (GAUZE/BANDAGES/DRESSINGS) IMPLANT
DRSG TELFA 3X8 NADH STRL (GAUZE/BANDAGES/DRESSINGS) ×1 IMPLANT
GAUZE 4X4 16PLY ~~LOC~~+RFID DBL (SPONGE) ×1 IMPLANT
GAUZE SPONGE 4X4 12PLY STRL (GAUZE/BANDAGES/DRESSINGS) IMPLANT
GLOVE BIO SURGEON STRL SZ 6.5 (GLOVE) IMPLANT
GLOVE BIO SURGEON STRL SZ7.5 (GLOVE) IMPLANT
GLOVE BIOGEL PI IND STRL 6.5 (GLOVE) IMPLANT
GLOVE BIOGEL PI IND STRL 8 (GLOVE) IMPLANT
GLOVE SURG SYN 6.5 ES PF (GLOVE) ×4 IMPLANT
GLOVE SURG SYN 6.5 PF PI (GLOVE) IMPLANT
GOWN STRL REUS TWL 2XL XL LVL4 (GOWN DISPOSABLE) IMPLANT
GOWN STRL REUS W/ TWL LRG LVL3 (GOWN DISPOSABLE) ×4 IMPLANT
HANDLE SUCTION POOLE (INSTRUMENTS) ×2 IMPLANT
HANDLE YANKAUER SUCT BULB TIP (MISCELLANEOUS) ×2 IMPLANT
KIT POST MORTEM ADULT 36X90 (BAG) ×1 IMPLANT
KIT TURNOVER KIT A (KITS) ×1 IMPLANT
LOOP VESSEL MAXI 1X406 RED (MISCELLANEOUS) ×1 IMPLANT
LOOP VESSEL MINI 0.8X406 BLUE (MISCELLANEOUS) ×1 IMPLANT
MANIFOLD NEPTUNE II (INSTRUMENTS) ×3 IMPLANT
MAT ABSORB FLUID 56X50 GRAY (MISCELLANEOUS) ×2 IMPLANT
NDL BIOPSY 14X6 SOFT TISS (NEEDLE) IMPLANT
NEEDLE BIOPSY 14X6 SOFT TISS (NEEDLE) IMPLANT
PACK BASIN MAJOR ARMC (MISCELLANEOUS) ×1 IMPLANT
PACK UNIVERSAL (MISCELLANEOUS) ×1 IMPLANT
SPONGE KITTNER 5P (MISCELLANEOUS) ×1 IMPLANT
SPONGE T-LAP 18X18 ~~LOC~~+RFID (SPONGE) ×5 IMPLANT
STAPLER SKIN PROX 35W (STAPLE) ×1 IMPLANT
SUCTION POOLE HANDLE (INSTRUMENTS) ×2 IMPLANT
SUT BONE WAX W31G (SUTURE) ×1 IMPLANT
SUT ETHIBOND #5 BRAIDED 30INL (SUTURE) IMPLANT
SUT ETHIBOND 2 V 37 (SUTURE) IMPLANT
SUT ETHIBOND 5-0 MS/4 CCS GRN (SUTURE) ×1 IMPLANT
SUT ETHIBOND NAB CT1 #1 30IN (SUTURE) ×3 IMPLANT
SUT ETHILON 2 0 FS 18 (SUTURE) ×1 IMPLANT
SUT ETHILON NAB BLK LR #2 30IN (SUTURE) IMPLANT
SUT PDS AB 1 TP1 54 (SUTURE) IMPLANT
SUT PROLENE 3 0 SH DA (SUTURE) IMPLANT
SUT PROLENE 4 0 SH DA (SUTURE) ×3 IMPLANT
SUT PROLENE 4-0 RB1 .5 CRCL 36 (SUTURE) IMPLANT
SUT PROLENE 5 0 RB 1 DA (SUTURE) IMPLANT
SUT PROLENE 6 0 BV (SUTURE) IMPLANT
SUT SILK 0 30XBRD TIE 6 (SUTURE) ×1 IMPLANT
SUT SILK 1 SH (SUTURE) IMPLANT
SUT SILK 1 TIES 10/18 (SUTURE) ×1 IMPLANT
SUT SILK 2 0 SH (SUTURE) IMPLANT
SUT SILK 2 0 SH CR/8 (SUTURE) IMPLANT
SUT SILK 2-0 18XBRD TIE 12 (SUTURE) ×1 IMPLANT
SUT SILK 3-0 18XBRD TIE 12 (SUTURE) IMPLANT
SUT SILK 4-0 18XBRD TIE 12 (SUTURE) IMPLANT
SUTURE ETHBND 5-0 MS/4 CCS GRN (SUTURE) IMPLANT
SYR 10ML LL (SYRINGE) ×1 IMPLANT
SYR 20ML LL LF (SYRINGE) ×1 IMPLANT
SYR BULB IRRIG 60ML STRL (SYRINGE) IMPLANT
SYR TOOMEY 50ML (SYRINGE) IMPLANT
TAPE UMBILICAL 1/8 X36 TWILL (MISCELLANEOUS) ×1 IMPLANT
TOWEL OR 17X26 4PK STRL BLUE (TOWEL DISPOSABLE) ×2 IMPLANT
TUBING CONNECTING 10 (TUBING) ×2 IMPLANT

## 2023-11-10 LAB — TYPE AND SCREEN
ABO/RH(D): O POS
Antibody Screen: NEGATIVE
Unit division: 0
Unit division: 0
Unit division: 0
Unit division: 0
Unit division: 0
Unit division: 0

## 2023-11-10 LAB — BPAM RBC
Blood Product Expiration Date: 202504132359
Blood Product Expiration Date: 202504132359
Blood Product Unit Number: 202504132359
Blood Product Unit Number: 202504132359
ISSUE DATE / TIME: 202503191054
ISSUE DATE / TIME: 202503191101
ISSUE DATE / TIME: 202504132359
PRODUCT CODE: 202503191101
PRODUCT CODE: 202504122359
PRODUCT CODE: 202504122359
PRODUCT CODE: 202504132359
PRODUCT CODE: 202504132359
Unit Type and Rh: 5100
Unit Type and Rh: 5100
Unit Type and Rh: 202503191101
Unit Type and Rh: 202504132359
Unit Type and Rh: 202504132359
Unit Type and Rh: 5100
Unit Type and Rh: 5100
Unit Type and Rh: 5100
Unit Type and Rh: 5100
Unit Type and Rh: 5100
Unit Type and Rh: 5100

## 2023-11-10 LAB — LIPOPROTEIN A (LPA): Lipoprotein (a): 53.4 nmol/L — ABNORMAL HIGH (ref <75.0)

## 2023-11-10 LAB — CULTURE, RESPIRATORY W GRAM STAIN

## 2023-11-10 LAB — SURGICAL PATHOLOGY

## 2023-11-12 LAB — CULTURE, BLOOD (ROUTINE X 2)
Culture: NO GROWTH
Culture: NO GROWTH
Special Requests: ADEQUATE

## 2023-11-22 NOTE — OR Nursing (Signed)
 Time of cardiac death 1234 per ICU RN [facility] Time of crossclamp 1241 per Dr Hyman Hopes

## 2023-11-22 NOTE — Progress Notes (Signed)
 NAME:  ALVEN ALVERIO, MRN:  [id number], DOB:  [birth year], LOS: 3 ADMISSION DATE:  11/06/2023, CONSULTATION DATE: 11/06/2023 REFERRING MD: Dr. Lenard Lance, CHIEF COMPLAINT: Altered Mental Status   History of Present Illness:  This is a 53 yo male who presented to [facility] ER on 03/16 via EMS from [facility] with altered mental status.  Per ER notes EMS called out to [employer] for "unknown medical."   When EMS arrived on the scene pt noted to have snoring respirations and unresponsive to all stimuli.  Facility staff reported prior to pt becoming unresponsiveness pt c/o dizziness.  Pt placed on NRB and a nasopharyngeal airway prior to EMS transport.    ED Course  Upon arrival to the ER pt on a NRB with snoring respirations, unresponsive, with dilated/non reactive pupils.  Pt with known hx of seizure disorder, prior CVA with left-sided hemiparesis, and receives outpatient opioid medications.  EDP ordered 2 mg of iv ativan, following administration mentation remained unchanged.  Due to inability to protect airway pt mechanically intubated.  CT Head revealed massive left-sided subdural hematoma, intraparenchymal hematoma; retroclival hematoma causing ventral compression of the brainstem; and intraventricular hematoma.  Neurosurgeon Dr. [name] consulted and reviewed imaging and performed a physical examination.  Per Dr. [name] findings represented a terminal event/hemorrhage and surgical procedures would be futile.  He recommended continuing supportive care from a medical standpoint until further decisions regarding goals of care were discussed or pt progresses to formal diagnosis of brain death. Dr. [name] attempted to contact pts family multiple times utilizing phone numbers listed in epic and provided by [employer], however family unreachable.  PCCM team contacted for ICU admission.  Significant lab results: Glucose 182/creatinine 0.55/calcium 8.4/wbc 12.6  CXR: Well-positioned  endotracheal and nasal/orogastric tubes. No evidence of acute cardiopulmonary disease.  CT Head: 13 cc left occipital hematoma with intraventricular extension. Supra and infra tentorial subdural hematoma, most extensive over on the left cerebral convexity with thickness measuring up to 2 cm. 2 cm of midline shift with left uncal and subfalcine herniation. Entrapment of the right lateral ventricle.  11/08/23- patient had urine culture with enterococcus faecalis, was asked by [facility] to provide therapy.  Have initiated BID zyvox.  Plan for OR in am 11/10/2023 - reviewed plan with [facility]. Going to OR today  Pertinent  Medical History  Anemia  Anxiety  BPH Chronic Pain  Depression  Essential HTN  Essential Tremor  GERD  Stroke with Residual Left Hemiplegia and Hemiparesis  Brain Tumor COVID-19 HLD  Osteoarthritis  Insomnia  Neuropathy  Overactive Bladder  Seizure Disorder   Significant Hospital Events: Including procedures, antibiotic start and stop dates in addition to other pertinent events   03/16: Admitted to ICU with terminal subdural and intraparenchymal hemorrhage requiring mechanical intubation for airway protection  11/07/23- patient s/p eval by [facility] for organ sharing network.  S/p TTE and EEG is in process. Reviewed case with neurologist Dr [name].  Family support staff is working with family at bedside for organ procurement. 11/08/23- patient s/p neurology eval and cardiology eval.  Family in comunication with [facility] regarding comfort measures and organ donation  Interim History / Subjective:  Pt mechanically intubated vent settings: FiO2 100%/PEEP 8.  Pt unresponsive/not breathing over the ventilator/gag reflexes present/no corneal reflexes.    Objective   Blood pressure 124/80, pulse (!) 102, temperature 99.3 F (37.4 C), resp. rate (!) 24, height 6\' 2"  (1.88 m), weight 98.1 kg, SpO2 98%.  Vent Mode: PRVC FiO2 (%):  [40 %-60 %] 40 % Set  Rate:  [20 bmp] 20 bmp Vt Set:  [550 mL] 550 mL PEEP:  [5 cmH20-8 cmH20] 8 cmH20 Plateau Pressure:  [19 cmH20-20 cmH20] 19 cmH20   Intake/Output Summary (Last 24 hours) at 11/05/2023 1042 Last data filed at 11/17/2023 0900 Gross per 24 hour  Intake 2609.88 ml  Output 2435 ml  Net 174.88 ml   Filed Weights   11/06/23 1253 11/08/23 0400 11/19/2023 0400  Weight: 92.9 kg 96.1 kg 98.1 kg   Examination: General: Acute on chronically-ill appearing male, NAD mechanically intubated  HENT: Supple, no JVD  Lungs: Faint rhonchi, throughout, even, non labored  Cardiovascular: NSR, s1s2, no m/r/g, 2+ radial/1+ distal, 1+generalized edema  Abdomen: +BS x4, obese, soft, non distended  Extremities: Not moving extremities  Neuro:  not following commands or withdrawing from painful stimulation, pt irregular non reactive, no corneal reflexes present, absent cough but no gag reflex present  GU: Indwelling foley catheter draining yellow urine   Resolved Hospital Problem list     Assessment & Plan:   #Massive left-sided subdural hematoma, intraparenchymal hematoma, retroclival hematoma causing ventral compression of the brainstem, and intraventricular hematoma #Mechanical intubation pain/discomfort  Hx: Brain tumor, seizure disorder, stroke with residual left hemiplegia and hemiparesis, and essential tremor  - Neurosurgery consulted appreciate input: CT findings revealed terminal intracranial hemorrhage and surgical interventions deemed futile  - Neuro checks per NIH stroke scale protocol  - Avoid sedating medications as able  - Maintain RASS goal 0  - PAD to maintain RASS goal: Prn propofol  - Seizure precautions - EEG pending  - Nicardipine gtt to maintain sbp 140 or less   #Mildly elevated troponin secondary to demand ischemia  - Continuous telemetry monitoring  - Trend troponin until peaked  - Nicardipine gtt to maintain sbp 140 or less   #Acute respiratory failure #Mechanical intubation  for airway protection due to catastrophic intracranial hemorrhage  - Full vent support for now: vent settings reviewed and established  - Continue lung protective strategies  - Maintain plateau pressures less than 30 cm H2O - Intermittent CXR and ABG's - VAP bundle implemented   E faecalis UTI - Zyvox  #GERD - PPI  #Hyperglycemia - Hemoglobin A1c pending   - CBG's q4hrs  - SSI - Follow hyper/hypoglycemic protocol  - Target CBG range 140 to 180   Best Practice (right click and "Reselect all SmartList Selections" daily)   Diet/type: NPO DVT prophylaxis SCD Pressure ulcer(s): N/A GI prophylaxis: PPI Lines: N/A Foley:  Yes, and it is still needed Code Status:  full code Last date of multidisciplinary goals of care discussion [N/A]  Labs   CBC: Recent Labs  Lab 11/06/23 0836 11/07/23 0018 11/07/23 0526 11/08/23 0530 11/08/23 1138 11/08/23 1509 11/08/23 1511 11/08/23 1709 11/08/23 2315 11/11/2023 0531  WBC 12.6* 20.5*   < > 18.4* 17.9*  --   --  18.3* 16.2* 15.3*  NEUTROABS 6.0 17.6*  --   --   --   --   --   --   --   --   HGB 16.3 16.5   < > 13.8 13.8 13.6 13.3 13.3 12.9* 12.6*  HCT 48.9 47.4   < > 41.2 39.9 40.0 39.0 38.3* 37.9* 37.4*  MCV 92.3 90.1   < > 90.5 90.5  --   --  90.3 91.5 91.2  PLT 306 250   < > 236 232  --   --  219 219 219   < > = values in this interval not displayed.    Basic Metabolic Panel: Recent Labs  Lab 11/08/23 0529 11/08/23 1138 11/08/23 1509 11/08/23 1511 11/08/23 1709 11/08/23 2315 11/19/2023 0531  NA 139 141 144 144 142 141 142  K 3.6 3.5 3.6 3.5 3.5 3.2* 3.7  CL 108 109  --   --  107 110 113*  CO2 25 25  --   --  24 24 25   GLUCOSE 151* 146*  --   --  132* 174* 113*  BUN 26* 26*  --   --  25* 23* 20  CREATININE 0.42* 0.40*  --   --  0.34* 0.35* 0.37*  CALCIUM 8.9 8.8*  --   --  9.0 8.3* 8.3*  MG 2.2 2.2  --   --  2.3 2.2 2.3  PHOS 2.2* 2.3*  --   --  2.4* 2.2* 2.4*   GFR: Estimated Creatinine Clearance: 122.7 mL/min (A)  (by C-G formula based on SCr of 0.37 mg/dL (L)). Recent Labs  Lab 11/08/23 1138 11/08/23 1709 11/08/23 2315 10/30/2023 0531  WBC 17.9* 18.3* 16.2* 15.3*    Liver Function Tests: Recent Labs  Lab 11/08/23 0529 11/08/23 1138 11/08/23 1709 11/08/23 2315 10/26/2023 0531  AST 14* 16 16 15 16   ALT 12 10 10 11 12   ALKPHOS 64 62 63 66 63  BILITOT 0.7 0.9 0.7 0.6 0.5  PROT 7.0 7.0 6.7 6.6 6.5  ALBUMIN 3.4* 3.4* 3.2* 3.0* 2.9*   Recent Labs  Lab 11/07/23 0018 11/07/23 1004 11/08/23 0529  LIPASE 23 20 21   AMYLASE 43 41 33   No results for input(s): "AMMONIA" in the last 168 hours.  ABG    Component Value Date/Time   PHART 7.46 (H) 11/10/2023 0824   PCO2ART 34 11/10/2023 0824   PO2ART 75 (L) 10/22/2023 0824   HCO3 24.2 11/10/2023 0824   TCO2 26 11/08/2023 1511   ACIDBASEDEF 0.1 11/07/2023 0247   O2SAT 98.1 10/28/2023 0824     Coagulation Profile: Recent Labs  Lab 11/08/23 0529 11/08/23 1138 11/08/23 1709 11/08/23 2315 11/12/2023 0531  INR 1.0 1.0 1.0 1.0 1.1    Cardiac Enzymes: Recent Labs  Lab 11/07/23 0018 11/07/23 1101 11/07/23 2332 11/08/23 1138 11/08/23 2315  CKTOTAL 75 49 38* 42* 30*    HbA1C: Hgb A1c MFr Bld  Date/Time Value Ref Range Status  11/06/2023 01:04 PM 5.4 4.8 - 5.6 % Final    Comment:    (NOTE) Pre diabetes:          5.7%-6.4%  Diabetes:              >6.4%  Glycemic control for   <7.0% adults with diabetes     CBG: Recent Labs  Lab 11/08/23 1615 11/08/23 1920 11/08/23 2314 11/02/2023 0400 11/20/2023 0745  GLUCAP 108* 115* 152* 130* 101*    Review of Systems:   Unable to assess pt mechanically intubated   Past Medical History:  He,  has a past medical history of Anemia, Anxiety, BPH (benign prostatic hyperplasia), Chronic pain, Depression, Dysphagia, Essential hypertension, Essential tremor, Facial paralysis on left side, GERD (gastroesophageal reflux disease), Hemiplegia and hemiparesis following cerebral infarction  affecting left non-dominant side (HCC), History of brain tumor, History of COVID-19 (08/11/2022), Hyperlipidemia, Insomnia, Left leg weakness, Neuropathy, OA (osteoarthritis), OAB (overactive bladder), Seizure disorder (HCC), Sinus tachycardia, and Stroke (HCC).   Surgical History:   Past Surgical History:  Procedure Laterality Date  BRAIN SURGERY  2013   brain tumor excision   COLONOSCOPY WITH PROPOFOL N/A 08/11/2021   Procedure: COLONOSCOPY WITH PROPOFOL;  Surgeon: Midge Minium, MD;  Location: [facility] ENDOSCOPY;  Service: Endoscopy;  Laterality: N/A;   INTRAMEDULLARY (IM) NAIL INTERTROCHANTERIC Left 02/15/2021   Procedure: INTRAMEDULLARY (IM) NAIL INTERTROCHANTRIC;  Surgeon: Durene Romans, MD;  Location[facility] OR;  Service: Orthopedics;  Laterality: Left;   REVERSE SHOULDER ARTHROPLASTY Right 10/29/2022   Procedure: REVERSE SHOULDER ARTHROPLASTY;  Surgeon: Beverely Low, MD;  Location: [facility] ORS;  Service: Orthopedics;  Laterality: Right;   REVERSE SHOULDER ARTHROPLASTY Left 05/13/2023   Procedure: REVERSE SHOULDER ARTHROPLASTY;  Surgeon: Beverely Low, MD;  Location: [facility] ORS;  Service: Orthopedics;  Laterality: Left;   RIGHT/LEFT HEART CATH AND CORONARY ANGIOGRAPHY N/A 11/08/2023   Procedure: RIGHT/LEFT HEART CATH AND CORONARY ANGIOGRAPHY;  Surgeon: Yvonne Kendall, MD;  Location: [facility] INVASIVE CV LAB;  Service: Cardiovascular;  Laterality: N/A;   shunt intracranial x2       Social History:   reports that he has been smoking cigarettes. He has a 25 pack-year smoking history. He has never used smokeless tobacco. He reports that he does not currently use alcohol. He reports that he does not currently use drugs.   Family History:  His family history is negative for CAD.   Allergies Allergies  Allergen Reactions   Penicillins Hives    Per MAR      Home Medications  Prior to Admission medications   Medication Sig Start Date End Date Taking? Authorizing Provider  acetaminophen (TYLENOL) 325 MG  tablet Take 650 mg by mouth 2 (two) times daily. May take an additional 650 mg if needed every 4 hours for pain    [provider]  aspirin 81 MG chewable tablet Chew 81 mg by mouth daily.    [provider]  atorvastatin (LIPITOR) 40 MG tablet Take 40 mg by mouth daily at 4 PM.    [provider]  azelastine (ASTELIN) 0.1 % nasal spray Place 2 sprays into both nostrils 2 (two) times daily. Use in each nostril as directed    [provider]  baclofen (LIORESAL) 20 MG tablet Take 20 mg by mouth in the morning, at noon, and at bedtime. 05/20/21   [provider]  busPIRone (BUSPAR) 10 MG tablet Take 10 mg by mouth 3 (three) times daily.    [provider]  carboxymethylcellulose (ARTIFICIAL TEARS) 1 % ophthalmic solution Place 1 drop into both eyes in the morning, at noon, in the evening, and at bedtime.    [provider]  carboxymethylcellulose 1 % ophthalmic solution Place 1 drop into the left eye at bedtime. Refresh liquigel    [provider]  celecoxib (CELEBREX) 200 MG capsule Take 1 capsule (200 mg total) by mouth daily. 05/14/21   Heather Roberts, NP  clonazePAM (KLONOPIN) 0.25 MG disintegrating tablet Take 0.25 mg by mouth 2 (two) times daily. 04/13/23   [provider]  diclofenac Sodium (VOLTAREN) 1 % GEL Apply 1 g topically 3 (three) times daily. Apply to left hips topically    [provider]  fluticasone (FLONASE) 50 MCG/ACT nasal spray Place 1 spray into both nostrils daily. 05/20/21   [provider]  gabapentin (NEURONTIN) 300 MG capsule Take 300-900 mg by mouth See admin instructions. Take 300 mg in the morning and 900 mg at bedtime    [provider]  ibuprofen (ADVIL) 400 MG tablet Take 400 mg by mouth every 8 (  eight) hours as needed (Back pain).    [provider]  Lidocaine 4 % PTCH Apply 1 patch topically daily. Apply to right shoulder for 12 hours max dose =3 patches  remove per schedule 05/11/21   [provider]  melatonin 5 MG TABS Take 5 mg by mouth at bedtime.    [provider]  mirabegron ER (MYRBETRIQ) 25 MG TB24 tablet Take 1 tablet (25 mg total) by mouth daily. 06/30/22   Stoioff, Verna Czech, MD  nortriptyline (PAMELOR) 10 MG capsule Take 10-50 mg by mouth See admin instructions. Take 10 mg in the morning and 50 ng at bedtime    [provider]  Omega-3 Fatty Acids (FISH OIL) 1000 MG CAPS Take 1,000 mg by mouth daily.    [provider]  oxycodone (OXY-IR) 5 MG capsule Take 1 capsule (5 mg total) by mouth every 6 (six) hours as needed for pain. 05/13/23   Beverely Low, MD  pantoprazole (PROTONIX) 20 MG tablet Take 20 mg by mouth every morning. 0700    [provider]  Polyethyl Glycol-Propyl Glycol (SYSTANE) 0.4-0.3 % SOLN Place 1 drop into both eyes at bedtime. There is a 2nd order in [facility] stating "Instill 1 drop into left eye at bedtime for exposure keratitis"    [provider]  propranolol (INDERAL) 10 MG tablet Take 10 mg by mouth 2 (two) times daily.    [provider]  QUEtiapine (SEROQUEL) 200 MG tablet Take 200 mg by mouth at bedtime. 07/29/22   [provider]  senna-docusate (SENOKOT-S) 8.6-50 MG tablet Take 2 tablets by mouth at bedtime. 02/18/21   Marguerita Merles Latif, DO  tamsulosin (FLOMAX) 0.4 MG CAPS capsule Take 0.4 mg by mouth daily. 08/20/20   [provider]     Critical care provider statement:   Total critical care time: 33 minutes   Performed by: Karna Christmas MD   Critical care time was exclusive of separately billable procedures and treating other patients.   Critical care was necessary to treat or prevent imminent or life-threatening deterioration.   Critical care was time spent personally by me on the following activities: development of treatment plan with patient and/or surrogate as well as nursing, discussions with consultants, evaluation of  patient's response to treatment, examination of patient, obtaining history from patient or surrogate, ordering and performing treatments and interventions, ordering and review of laboratory studies, ordering and review of radiographic studies, pulse oximetry and re-evaluation of patient's condition.    Vida Rigger, M.D.  Pulmonary & Critical Care Medicine

## 2023-11-22 NOTE — Death Summary Note (Signed)
 DEATH SUMMARY   Patient Details  Name: Roger Morton MRN: [id number] DOB: 06/26/1971  Admission/Discharge Information   Admit Date:  Dec 01, 2023  Date of Death:  2023-12-04  Time of Death:  1234 pm  Length of Stay: 3  Referring Physician: Aldean Baker, FNP   Reason(s) for Hospitalization  Altered Mental Status   Diagnoses  Preliminary cause of death: Intracranial hemorrhage (HCC) Secondary Diagnoses (including complications and co-morbidities):  Principal Problem:   Intracranial hemorrhage (HCC) Active Problems:   Altered mental status   Massive left-sided subdural hematoma   Intraparenchymal hematoma    Retroclival hematoma causing ventral compression of the brainstem   Intraventricular hematoma    Mildly elevated troponin secondary to demand ischemia   Acute respiratory failure    Hyperglycemia    Klebsiella ornithinolytica pneumonia    Enterococcus UTI  Brief Hospital Course (including significant findings, care, treatment, and services provided and events leading to death)  Roger Morton is a 53 y.o. year old male who presented to [facility] ER on 12/01/2023 via EMS from [employer] with altered mental status.  Per ER notes EMS called out to [employer] for "unknown medical."   When EMS arrived on the scene pt noted to have snoring respirations and unresponsive to all stimuli.  Facility staff reported prior to pt becoming unresponsiveness pt c/o dizziness.  Pt placed on NRB and a nasopharyngeal airway prior to EMS transport.     ED Course  Upon arrival to the ER pt on a NRB with snoring respirations, unresponsive, with dilated/non reactive pupils.  Pt with known hx of seizure disorder, prior CVA with left-sided hemiparesis, and receives outpatient opioid medications.  EDP ordered 2 mg of iv ativan, following administration mentation remained unchanged.  Due to inability to protect airway pt mechanically intubated.  CT Head revealed massive left-sided  subdural hematoma, intraparenchymal hematoma; retroclival hematoma causing ventral compression of the brainstem; and intraventricular hematoma.  Neurosurgeon Dr. Katrinka Morton consulted and reviewed imaging and performed a physical examination.  Per Dr. Katrinka Morton findings represented a terminal event/hemorrhage and surgical procedures would be futile.  He recommended continuing supportive care from a medical standpoint until further decisions regarding goals of care were discussed or pt progresses to formal diagnosis of brain death. Dr. Katrinka Morton attempted to contact pts family multiple times utilizing phone numbers listed in epic and provided by [employer], however family unreachable.  See detailed hospital course below under significant events.   Significant Events   12-01-23: Admitted to ICU with terminal subdural and intraparenchymal hemorrhage requiring mechanical intubation for airway protection  03/17: patient s/p eval by [facility] for organ sharing network.  S/p TTE and EEG is in process. Reviewed case with neurologist Dr Roger Morton.  Family support staff is working with family at bedside for organ procurement. 03/18: patient s/p neurology eval and cardiology eval.  Family in comunication with [facility] regarding comfort measures and organ donation 12/04/23: Pt transferred to the OR for organ procurement time of death 12:34 pm on December 04, 2023  Pertinent Labs and Studies  Significant Diagnostic Studies CARDIAC CATHETERIZATION Result Date: 11/08/2023 Conclusions: Mild, non-obstructive coronary artery disease with 10-20% stenosis of the mid LAD and 30% stenosis at the ostium of large Roger Morton.  No angiographically significant disease is seen in the LMCA, LCx, or RCA. Normal left heart filling pressure (PCWP 14 mmHg, LVEDP 15 mmHg). Mildly elevated right heart and pulmonary artery pressures (mean RA/RVEDP 10 mmHg, PA 37/22 mmHg, mean PA 27 mmHg). Normal  cardiac output/index (Fick CO/CI 7.1 L/min, 3.2  L/min/m^2; thermodilution CO/CI 8.4 L/min, CI 3.8 L/min/m^2). Recommendations: Ongoing management per ICU team. Roger Kendall, MD Roger Morton  EEG adult Result Date: 11/07/2023 Roger Brock, MD     11/07/2023  2:24 PM History: 53 year old male with massive intracranial hemorrhage, being evaluated for seizure Sedation: Fentanyl 100 mcg/h Patient State: Comatose Technique: This EEG was acquired with electrodes placed according to the International 10-20 electrode system (including Fp1, Fp2, F3, F4, C3, C4, P3, P4, O1, O2, T3, T4, T5, T6, A1, A2, Fz, Cz, Pz). The following electrodes were missing or displaced: none. Background: The background consists of low voltage irregular delta and theta range activities, with predominant activity being in the 5 to 6 Hz range.  There is an asymmetry with attenuation of faster frequencies on the left compared to the right.  There are occasional sleep spindles which are symmetric in distribution.  There is no clear reactivity to noxious stimulation. Photic stimulation: Physiologic driving is not performed EEG Abnormalities: 1) generalized irregular slow activity 2) attenuation of faster frequencies on the left compared to the right Clinical Interpretation: This EEG recorded evidence of a focal cerebral dysfunction in the left hemisphere in the setting of a more generalized nonspecific cerebral dysfunction (encephalopathy). There was no seizure or seizure predisposition recorded on this study. Please note that lack of epileptiform activity on EEG does not preclude the possibility of epilepsy. Roger Slot, MD [facility] If 7pm- 7am, please page neurology on call as listed in AMION.  ECHOCARDIOGRAM COMPLETE Result Date: 11/07/2023    ECHOCARDIOGRAM REPORT   Patient Name:   Roger Morton Date of Exam: 11/07/2023 Medical Rec #:  [id number]        Height:       74.0 in Accession #:    [id number]       Weight:       204.8 lb Date of Birth:   [birth year]       BSA:          2.196 m Patient Age:    52 years         BP:           114/77 mmHg Patient Gender: M                HR:           86 bpm. Exam Location:  [facility] Procedure: 2D Echo, Cardiac Doppler, Color Doppler and 3D Echo (Both Spectral            and Color Flow Doppler were utilized during procedure). STAT ECHO Indications:     Fever  History:         Patient has prior history of Echocardiogram examinations, most                  recent 02/19/2021. Stroke, Signs/Symptoms:Fever and Chest Pain;                  Risk Factors:Dyslipidemia.  Sonographer:     Mikki Harbor Referring Phys:  [id number] Hubbard Hartshorn OUMA Diagnosing Phys: Lorine Bears MD  Sonographer Comments: Echo performed with patient supine and on artificial respirator. IMPRESSIONS  1. Left ventricular ejection fraction, by estimation, is 55 to 60%. Left ventricular ejection fraction by 3D volume is 63 %. The left ventricle has normal function. The left ventricle has no regional wall motion abnormalities. There is mild left ventricular hypertrophy. Left ventricular diastolic parameters are consistent with Grade  I diastolic dysfunction (impaired relaxation).  2. Right ventricular systolic function is normal. The right ventricular size is normal. Tricuspid regurgitation signal is inadequate for assessing PA pressure.  3. The mitral valve is normal in structure. No evidence of mitral valve regurgitation. No evidence of mitral stenosis.  4. The aortic valve is normal in structure. Aortic valve regurgitation is not visualized. No aortic stenosis is present.  5. Aortic dilatation noted. There is borderline dilatation of the aortic root, measuring 39 mm.  6. The inferior vena cava is normal in size with greater than 50% respiratory variability, suggesting right atrial pressure of 3 mmHg. FINDINGS  Left Ventricle: Left ventricular ejection fraction, by estimation, is 55 to 60%. Left ventricular ejection fraction by 3D volume is 63 %. The  left ventricle has normal function. The left ventricle has no regional wall motion abnormalities. The left ventricular internal cavity size was normal in size. There is mild left ventricular hypertrophy. Left ventricular diastolic parameters are consistent with Grade I diastolic dysfunction (impaired relaxation). Right Ventricle: The right ventricular size is normal. No increase in right ventricular wall thickness. Right ventricular systolic function is normal. Tricuspid regurgitation signal is inadequate for assessing PA pressure. Left Atrium: Left atrial size was normal in size. Right Atrium: Right atrial size was normal in size. Pericardium: There is no evidence of pericardial effusion. Mitral Valve: The mitral valve is normal in structure. No evidence of mitral valve regurgitation. No evidence of mitral valve stenosis. MV peak gradient, 3.9 mmHg. The mean mitral valve gradient is 2.0 mmHg. Tricuspid Valve: The tricuspid valve is normal in structure. Tricuspid valve regurgitation is not demonstrated. No evidence of tricuspid stenosis. Aortic Valve: The aortic valve is normal in structure. Aortic valve regurgitation is not visualized. No aortic stenosis is present. Aortic valve mean gradient measures 5.0 mmHg. Aortic valve peak gradient measures 9.3 mmHg. Aortic valve area, by VTI measures 2.81 cm. Pulmonic Valve: The pulmonic valve was normal in structure. Pulmonic valve regurgitation is not visualized. No evidence of pulmonic stenosis. Aorta: Aortic dilatation noted. There is borderline dilatation of the aortic root, measuring 39 mm. Venous: The inferior vena cava is normal in size with greater than 50% respiratory variability, suggesting right atrial pressure of 3 mmHg. IAS/Shunts: No atrial level shunt detected by color flow Doppler. Additional Comments: 3D was performed not requiring image post processing on an independent workstation and was normal.  LEFT VENTRICLE PLAX 2D LVIDd:         4.10 cm          Diastology LVIDs:         2.70 cm         LV e' medial:    11.30 cm/s LV PW:         1.20 cm         LV E/e' medial:  5.6 LV IVS:        1.20 cm         LV e' lateral:   9.14 cm/s LVOT diam:     2.00 cm         LV E/e' lateral: 6.9 LV SV:         72 LV SV Index:   33 LVOT Area:     3.14 cm        3D Volume EF  LV 3D EF:    Left                                             ventricul LV Volumes (MOD)                            ar LV vol d, MOD    49.8 ml                    ejection A2C:                                        fraction LV vol d, MOD    41.6 ml                    by 3D A4C:                                        volume is LV vol s, MOD    17.3 ml                    63 %. A2C: LV vol s, MOD    14.5 ml A4C:                           3D Volume EF: LV SV MOD A2C:   32.4 ml       3D EF:        63 % LV SV MOD A4C:   41.6 ml LV SV MOD BP:    31.4 ml RIGHT VENTRICLE RV Basal diam:  3.05 cm RV Mid diam:    2.50 cm RV S prime:     18.30 cm/s TAPSE (M-mode): 2.5 cm LEFT ATRIUM             Index        RIGHT ATRIUM           Index LA diam:        1.90 cm 0.87 cm/m   RA Area:     12.30 cm LA Vol (A2C):   26.1 ml 11.89 ml/m  RA Volume:   27.30 ml  12.43 ml/m LA Vol (A4C):   15.5 ml 7.06 ml/m LA Biplane Vol: 20.5 ml 9.34 ml/m  AORTIC VALVE                    PULMONIC VALVE AV Area (Vmax):    2.04 cm     PV Vmax:       1.42 m/s AV Area (Vmean):   2.34 cm     PV Peak grad:  8.1 mmHg AV Area (VTI):     2.81 cm AV Vmax:           152.50 cm/s AV Vmean:          95.500 cm/s AV VTI:            0.257 m AV Peak Grad:      9.3 mmHg AV Mean Grad:      5.0 mmHg LVOT Vmax:  99.10 cm/s LVOT Vmean:        71.100 cm/s LVOT VTI:          0.230 m LVOT/AV VTI ratio: 0.89  AORTA Ao Root diam: 3.90 cm MITRAL VALVE MV Area (PHT): 3.74 cm    SHUNTS MV Area VTI:   3.47 cm    Systemic VTI:  0.23 m MV Peak grad:  3.9 mmHg    Systemic Diam: 2.00 cm MV Mean grad:  2.0 mmHg MV Vmax:       0.99 m/s MV  Vmean:      63.1 cm/s MV Decel Time: 203 msec MV E velocity: 63.00 cm/s MV A velocity: 85.60 cm/s MV E/A ratio:  0.74 Lorine Bears MD Electronically signed by Lorine Bears MD Signature Date/Time: 11/07/2023/9:59:46 AM    Final    CT CHEST ABDOMEN PELVIS W CONTRAST Result Date: 11/07/2023 CLINICAL DATA:  Potential organ donor EXAM: CT CHEST, ABDOMEN, AND PELVIS WITH CONTRAST TECHNIQUE: Multidetector CT imaging of the chest, abdomen and pelvis was performed following the standard protocol during bolus administration of intravenous contrast. RADIATION DOSE REDUCTION: This exam was performed according to the departmental dose-optimization program which includes automated exposure control, adjustment of the mA and/or kV according to patient size and/or use of iterative reconstruction technique. CONTRAST:  OMNIPAQUE IOHEXOL 300 MG/ML  SOLN COMPARISON:  Chest radiograph 11/06/2023 and CT chest 08/21/2021 FINDINGS: CT CHEST FINDINGS Cardiovascular: Normal heart size. No pericardial effusion. Normal caliber aorta. Mediastinum/Nodes: Endotracheal tube tip in the intrathoracic trachea. Enteric tube tip in the stomach. No thoracic adenopathy. Lungs/Pleura: Paraseptal and centrilobular emphysema greatest in the upper lobes. Multiple bilateral pulmonary nodules many of which demonstrate calcification. These are unchanged from 08/21/2021 and compatible with benign granulomas. Centrilobular micro nodules and tree-in-bud opacities with band like consolidation in the left lower lobe. No pleural effusion or pneumothorax. Musculoskeletal: Bilateral reverse TSAs. No acute fracture or destructive osseous lesion. CT ABDOMEN PELVIS FINDINGS Hepatobiliary: No focal hepatic lesion. Gallbladder and biliary tree are unremarkable. Pancreas: Unremarkable. No pancreatic ductal dilatation or surrounding inflammatory changes. Spleen: Unremarkable. Adrenals/Urinary Tract: Normal adrenal glands. Simple cysts in the left kidney. Urinary  calculi or hydronephrosis. Foley catheter and gas within the nondistended bladder. Stomach/Bowel: Rectal tube. No bowel obstruction or bowel wall thickening. Normal appendix. Stomach is within normal limits. Vascular/Lymphatic: Aortic atherosclerotic calcification. No lymphadenopathy. Reproductive: Unremarkable. Other: No free intraperitoneal fluid or air. VP shunt catheter with tip terminating in the low pelvis. Musculoskeletal: IM rod and screw fixation left femur. No acute fracture. No destructive osseous lesion. A vascular necrosis both femoral heads. IMPRESSION: 1. Centrilobular micro nodules and tree-in-bud opacities with band like consolidation in the left lower lobe. Findings are compatible with aspiration and/or pneumonia. 2. No acute abnormality in the abdomen or pelvis. Electronically Signed   By: Minerva Fester M.D.   On: 11/07/2023 01:58   CT Head Wo Contrast Result Date: 11/06/2023 CLINICAL DATA:  Altered mental status EXAM: CT HEAD WITHOUT CONTRAST TECHNIQUE: Contiguous axial images were obtained from the base of the skull through the vertex without intravenous contrast. RADIATION DOSE REDUCTION: This exam was performed according to the departmental dose-optimization program which includes automated exposure control, adjustment of the mA and/or kV according to patient size and/or use of iterative reconstruction technique. COMPARISON:  01/14/2021 FINDINGS: Brain: Acute hematoma in the left occipital lobe measuring 38 x 18 x 37 mm. There is intraventricular extension with blood clot in the lateral ventricles. Medially there is contact with the inter  hemispheric fissure, presumably the source of a large and generalized subdural hematoma along the left cerebral convexity which measures up to 19 mm laterally on the left. Thinner subdural hematoma also involves the inter hemispheric fissure, right tentorium, and retro clival locations. Pronounced midline shift measuring 2 cm with asymmetric  dilatation/entrapment of the right lateral ventricle. 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 VP shunt from a right frontal approach, tip near but not immediately at the level of parenchymal hemorrhage, coarse distorted by the brain shift. Vascular: No hyperdense vessel or unexpected calcification. Skull: Left retromastoid craniotomy. Bulging of meninges through the defect which may have increased in the setting of elevated intracranial pressure. Sinuses/Orbits: Negative Other: Critical Value/emergent results were called by telephone at the time of interpretation on 11/06/2023 at 9:10 am to provider [facility] , who was already aware of the findings. IMPRESSION: 13 cc left occipital hematoma with intraventricular extension. Supra and infra tentorial subdural hematoma, most extensive over on the left cerebral convexity with thickness measuring up to 2 cm. 2 cm of midline shift with left uncal and subfalcine herniation. Entrapment of the right lateral ventricle. Electronically Signed   By: Tiburcio Pea M.D.   On: 11/06/2023 09:21   DG Chest Portable 1 View Result Date: 11/06/2023 CLINICAL DATA:  Intubation. EXAM: PORTABLE CHEST 1 VIEW COMPARISON:  09/20/2020. FINDINGS: Endotracheal tube tip projects 5.1 cm above the carina. Nasal/orogastric tube passes below the diaphragm, tip in the mid stomach. Cardiac silhouette is normal in size. No mediastinal or hilar masses. Lungs demonstrate thickened interstitial markings that are chronic, as well as chronic apical apical left lung base scarring. No lung consolidation to suggest pneumonia. No evidence of edema. No pleural effusion or gross pneumothorax on this supine study. IMPRESSION: 1. Well-positioned endotracheal and nasal/orogastric tubes. 2. No evidence of acute cardiopulmonary disease. Electronically Signed   By: Amie Portland M.D.   On: 11/06/2023 08:58    Microbiology Recent Results (from the past 240  hours)  Resp panel by RT-PCR (RSV, Flu A&B, Covid) Anterior Nasal Swab     Status: None   Collection Time: 11/06/23 12:27 PM   Specimen: Anterior Nasal Swab  Result Value Ref Range Status   SARS Coronavirus 2 by RT PCR NEGATIVE NEGATIVE Final    Comment: (NOTE) SARS-CoV-2 target nucleic acids are NOT DETECTED.  The SARS-CoV-2 RNA is generally detectable in upper respiratory specimens during the acute phase of infection. The lowest concentration of SARS-CoV-2 viral copies this assay can detect is 138 copies/mL. A negative result does not preclude SARS-Cov-2 infection and should not be used as the sole basis for treatment or other patient management decisions. A negative result may occur with  improper specimen collection/handling, submission of specimen other than nasopharyngeal swab, presence of viral mutation(s) within the areas targeted by this assay, and inadequate number of viral copies(<138 copies/mL). A negative result must be combined with clinical observations, patient history, and epidemiological information. The expected result is Negative.  Fact Sheet for Patients:  BloggerCourse.com  Fact Sheet for Healthcare Providers:  SeriousBroker.it  This test is no t yet approved or cleared by the [place] FDA and  has been authorized for detection and/or diagnosis of SARS-CoV-2 by FDA under an Emergency Use Authorization (EUA). This EUA will remain  in effect (meaning this test can be used) for the duration of the COVID-19 declaration under Section 564(b)(1) of the Act, 21 U.S.C.section 360bbb-3(b)(1), unless the authorization is terminated  or revoked sooner.       Influenza A by PCR NEGATIVE NEGATIVE Final   Influenza B by PCR NEGATIVE NEGATIVE Final    Comment: (NOTE) The Xpert Xpress SARS-CoV-2/FLU/RSV plus assay is intended as an aid in the diagnosis of influenza from Nasopharyngeal swab specimens and should not  be used as a sole basis for treatment. Nasal washings and aspirates are unacceptable for Xpert Xpress SARS-CoV-2/FLU/RSV testing.  Fact Sheet for Patients: BloggerCourse.com  Fact Sheet for Healthcare Providers: SeriousBroker.it  This test is not yet approved or cleared by the [place] FDA and has been authorized for detection and/or diagnosis of SARS-CoV-2 by FDA under an Emergency Use Authorization (EUA). This EUA will remain in effect (meaning this test can be used) for the duration of the COVID-19 declaration under Section 564(b)(1) of the Act, 21 U.S.C. section 360bbb-3(b)(1), unless the authorization is terminated or revoked.     Resp Syncytial Virus by PCR NEGATIVE NEGATIVE Final    Comment: (NOTE) Fact Sheet for Patients: BloggerCourse.com  Fact Sheet for Healthcare Providers: SeriousBroker.it  This test is not yet approved or cleared by the [place] FDA and has been authorized for detection and/or diagnosis of SARS-CoV-2 by FDA under an Emergency Use Authorization (EUA). This EUA will remain in effect (meaning this test can be used) for the duration of the COVID-19 declaration under Section 564(b)(1) of the Act, 21 U.S.C. section 360bbb-3(b)(1), unless the authorization is terminated or revoked.  Performed at [facility], [address] [place], [place] [address]   MRSA Next Gen by PCR, Nasal     Status: None   Collection Time: 11/06/23  1:01 PM  Result Value Ref Range Status   MRSA by PCR Next Gen NOT DETECTED NOT DETECTED Final    Comment: (NOTE) The GeneXpert MRSA Assay (FDA approved for NASAL specimens only), is one component of a comprehensive MRSA colonization surveillance program. It is not intended to diagnose MRSA infection nor to guide or monitor treatment for MRSA infections. Test performance is not FDA approved in patients less  than 84 years old. Performed at [facility], [address] [place], [place] [address]   SARS Coronavirus 2 by RT PCR (hospital order, performed in [facility] hospital lab) *cepheid single result test* Anterior Nasal Swab     Status: None   Collection Time: 11/07/23 12:39 AM   Specimen: Anterior Nasal Swab  Result Value Ref Range Status   SARS Coronavirus 2 by RT PCR NEGATIVE NEGATIVE Final    Comment: (NOTE) SARS-CoV-2 target nucleic acids are NOT DETECTED.  The SARS-CoV-2 RNA is generally detectable in upper and lower respiratory specimens during the acute phase of infection. The lowest concentration of SARS-CoV-2 viral copies this assay can detect is 250 copies / mL. A negative result does not preclude SARS-CoV-2 infection and should not be used as the sole basis for treatment or other patient management decisions.  A negative result may occur with improper specimen collection / handling, submission of specimen other than nasopharyngeal swab, presence of viral mutation(s) within the areas targeted by this assay, and inadequate number of viral copies (<250 copies / mL). A negative result must be combined with clinical observations, patient history, and epidemiological information.  Fact Sheet for Patients:   RoadLapTop.co.za  Fact Sheet for Healthcare Providers: http://kim-miller.com/  This test is not yet approved or  cleared by the [place] FDA and has been authorized for detection and/or diagnosis of SARS-CoV-2 by FDA under an  Emergency Use Authorization (EUA).  This EUA will remain in effect (meaning this test can be used) for the duration of the COVID-19 declaration under Section 564(b)(1) of the Act, 21 U.S.C. section 360bbb-3(b)(1), unless the authorization is terminated or revoked sooner.  Performed at [facility], [address] [place], [place] [address]   Urine Culture     Status: Abnormal    Collection Time: 11/07/23 10:57 AM   Specimen: Urine, Catheterized  Result Value Ref Range Status   Specimen Description   Final    URINE, CATHETERIZED Performed at [facility], [address], [place], [place] [address]    Special Requests   Final    NONE Reflexed from [id number] Performed at [facility], [address] [place], [place] [address]    Culture >=100,000 COLONIES/mL ENTEROCOCCUS FAECALIS (A)  Final   Report Status 11/14/2023 FINAL  Final   Organism ID, Bacteria ENTEROCOCCUS FAECALIS (A)  Final      Susceptibility   Enterococcus faecalis - MIC*    AMPICILLIN <=2 SENSITIVE Sensitive     NITROFURANTOIN <=16 SENSITIVE Sensitive     VANCOMYCIN 1 SENSITIVE Sensitive     * >=100,000 COLONIES/mL ENTEROCOCCUS FAECALIS  Culture, blood (Routine X 2) w Reflex to ID Panel     Status: None (Preliminary result)   Collection Time: 11/07/23 12:40 PM   Specimen: BLOOD  Result Value Ref Range Status   Specimen Description BLOOD BLOOD LEFT HAND  Final   Special Requests   Final    BOTTLES DRAWN AEROBIC AND ANAEROBIC Blood Culture results may not be optimal due to an inadequate volume of blood received in culture bottles   Culture   Final    NO GROWTH 2 DAYS Performed at [facility], [address], [place], [place] [address]    Report Status PENDING  Incomplete  Culture, blood (Routine X 2) w Reflex to ID Panel     Status: None (Preliminary result)   Collection Time: 11/07/23 12:49 PM   Specimen: BLOOD  Result Value Ref Range Status   Specimen Description BLOOD BLOOD RIGHT HAND  Final   Special Requests   Final    BOTTLES DRAWN AEROBIC AND ANAEROBIC Blood Culture adequate volume   Culture   Final    NO GROWTH 2 DAYS Performed at [facility], [address] [place], [place] [address]    Report Status PENDING  Incomplete  Culture, Respiratory w Gram Stain     Status: None (Preliminary result)   Collection Time: 11/07/23 12:54 PM    Specimen: Tracheal Aspirate; Respiratory  Result Value Ref Range Status   Specimen Description   Final    TRACHEAL ASPIRATE Performed at [facility], [address], [place], [place] [address]    Special Requests   Final    NONE Performed at [facility], [address] [place], [place] [address]    Gram Stain   Final    ABUNDANT WBC PRESENT, PREDOMINANTLY PMN RARE BUDDING YEAST SEEN RARE GRAM POSITIVE COCCI FEW GRAM NEGATIVE RODS    Culture   Final    MODERATE KLEBSIELLA ORNITHINOLYTICA SUSCEPTIBILITIES TO FOLLOW Performed at [facility], [address], [place], [place] [address]    Report Status PENDING  Incomplete    Lab Basic Metabolic Panel: Recent Labs  Lab 11/08/23 0529 11/08/23 1138 11/08/23 1509 11/08/23 1511 11/08/23 1709 11/08/23 2315 10/24/2023 0531  NA 139 141 144 144 142 141 142  K 3.6 3.5 3.6 3.5 3.5 3.2* 3.7  CL 108 109  --   --  107 110 113*  CO2 25 25  --   --  24 24 25   GLUCOSE 151* 146*  --   --  132* 174* 113*  BUN 26* 26*  --   --  25* 23* 20  CREATININE 0.42* 0.40*  --   --  0.34* 0.35* 0.37*  CALCIUM 8.9 8.8*  --   --  9.0 8.3* 8.3*  MG 2.2 2.2  --   --  2.3 2.2 2.3  PHOS 2.2* 2.3*  --   --  2.4* 2.2* 2.4*   Liver Function Tests: Recent Labs  Lab 11/08/23 0529 11/08/23 1138 11/08/23 1709 11/08/23 2315 11/20/2023 0531  AST 14* 16 16 15 16   ALT 12 10 10 11 12   ALKPHOS 64 62 63 66 63  BILITOT 0.7 0.9 0.7 0.6 0.5  PROT 7.0 7.0 6.7 6.6 6.5  ALBUMIN 3.4* 3.4* 3.2* 3.0* 2.9*   Recent Labs  Lab 11/07/23 0018 11/07/23 1004 11/08/23 0529  LIPASE 23 20 21   AMYLASE 43 41 33   No results for input(s): "AMMONIA" in the last 168 hours. CBC: Recent Labs  Lab 11/06/23 0836 11/07/23 0018 11/07/23 0526 11/08/23 0530 11/08/23 1138 11/08/23 1509 11/08/23 1511 11/08/23 1709 11/08/23 2315 11/18/2023 0531  WBC 12.6* 20.5*   < > 18.4* 17.9*  --   --  18.3* 16.2* 15.3*  NEUTROABS 6.0 17.6*  --   --   --   --    --   --   --   --   HGB 16.3 16.5   < > 13.8 13.8 13.6 13.3 13.3 12.9* 12.6*  HCT 48.9 47.4   < > 41.2 39.9 40.0 39.0 38.3* 37.9* 37.4*  MCV 92.3 90.1   < > 90.5 90.5  --   --  90.3 91.5 91.2  PLT 306 250   < > 236 232  --   --  219 219 219   < > = values in this interval not displayed.   Cardiac Enzymes: Recent Labs  Lab 11/07/23 0018 11/07/23 1101 11/07/23 2332 11/08/23 1138 11/08/23 2315  CKTOTAL 75 49 38* 42* 30*   Sepsis Labs: Recent Labs  Lab 11/08/23 1138 11/08/23 1709 11/08/23 2315 11/15/2023 0531  WBC 17.9* 18.3* 16.2* 15.3*    Procedures/Operations  Mechanical Intubation  Cardiac Catheterization  EEG  Arterial Line Placement   Zada Girt, AGNP  Pulmonary/Critical Care Pager [phone] (please enter 7 digits) PCCM Consult Pager [phone] (please enter 7 digits)

## 2023-11-22 NOTE — Plan of Care (Signed)
 Problem: Health Behavior/Discharge Planning: Goal: Goals will be collaboratively established with patient/family Outcome: Progressing   Problem: Nutrition: Goal: Risk of aspiration will decrease Outcome: Progressing   Problem: Clinical Measurements: Goal: Ability to maintain clinical measurements within normal limits will improve Outcome: Progressing Goal: Will remain free from infection Outcome: Progressing Goal: Diagnostic test results will improve Outcome: Progressing Goal: Respiratory complications will improve Outcome: Progressing Goal: Cardiovascular complication will be avoided Outcome: Progressing   Problem: Activity: Goal: Risk for activity intolerance will decrease Outcome: Progressing   Problem: Coping: Goal: Level of anxiety will decrease Outcome: Progressing   Problem: Elimination: Goal: Will not experience complications related to bowel motility Outcome: Progressing Goal: Will not experience complications related to urinary retention Outcome: Progressing   Problem: Pain Managment: Goal: General experience of comfort will improve and/or be controlled Outcome: Progressing   Problem: Safety: Goal: Ability to remain free from injury will improve Outcome: Progressing   Problem: Skin Integrity: Goal: Risk for impaired skin integrity will decrease Outcome: Progressing   Problem: Fluid Volume: Goal: Ability to maintain a balanced intake and output will improve Outcome: Progressing   Problem: Metabolic: Goal: Ability to maintain appropriate glucose levels will improve Outcome: Progressing   Problem: Nutritional: Goal: Progress toward achieving an optimal weight will improve Outcome: Progressing   Problem: Skin Integrity: Goal: Risk for impaired skin integrity will decrease Outcome: Progressing   Problem: Tissue Perfusion: Goal: Adequacy of tissue perfusion will improve Outcome: Progressing   Problem: Cardiovascular: Goal: Ability to achieve  and maintain adequate cardiovascular perfusion will improve Outcome: Progressing Goal: Vascular access site(s) Level 0-1 will be maintained Outcome: Progressing   Problem: Education: Goal: Knowledge of disease or condition will improve Outcome: Not Progressing Goal: Knowledge of secondary prevention will improve (MUST DOCUMENT ALL) Outcome: Not Progressing Goal: Knowledge of patient specific risk factors will improve (DELETE if not current risk factor) Outcome: Not Progressing   Problem: Intracerebral Hemorrhage Tissue Perfusion: Goal: Complications of Intracerebral Hemorrhage will be minimized Outcome: Not Progressing   Problem: Coping: Goal: Will verbalize positive feelings about self Outcome: Not Progressing   Problem: Health Behavior/Discharge Planning: Goal: Ability to manage health-related needs will improve Outcome: Not Progressing   Problem: Self-Care: Goal: Ability to participate in self-care as condition permits will improve Outcome: Not Progressing Goal: Verbalization of feelings and concerns over difficulty with self-care will improve Outcome: Not Progressing Goal: Ability to communicate needs accurately will improve Outcome: Not Progressing   Problem: Nutrition: Goal: Dietary intake will improve Outcome: Not Progressing   Problem: Education: Goal: Knowledge of General Education information will improve Description: Including pain rating scale, medication(s)/side effects and non-pharmacologic comfort measures Outcome: Not Progressing   Problem: Health Behavior/Discharge Planning: Goal: Ability to manage health-related needs will improve Outcome: Not Progressing   Problem: Nutrition: Goal: Adequate nutrition will be maintained Outcome: Not Progressing   Problem: Education: Goal: Ability to describe self-care measures that may prevent or decrease complications (Diabetes Survival Skills Education) will improve Outcome: Not Progressing Goal: Individualized  Educational Video(s) Outcome: Not Progressing   Problem: Coping: Goal: Ability to adjust to condition or change in health will improve Outcome: Not Progressing   Problem: Health Behavior/Discharge Planning: Goal: Ability to identify and utilize available resources and services will improve Outcome: Not Progressing Goal: Ability to manage health-related needs will improve Outcome: Not Progressing   Problem: Nutritional: Goal: Maintenance of adequate nutrition will improve Outcome: Not Progressing   Problem: Education: Goal: Understanding of CV disease, CV risk reduction, and recovery process will  improve Outcome: Not Progressing Goal: Individualized Educational Video(s) Outcome: Not Progressing   Problem: Activity: Goal: Ability to return to baseline activity level will improve Outcome: Not Progressing   Problem: Health Behavior/Discharge Planning: Goal: Ability to safely manage health-related needs after discharge will improve Outcome: Not Progressing

## 2023-11-22 NOTE — Progress Notes (Addendum)
 Patient brought to the OR with honor bridge for organ procurement. Patient was extubated at 1215 and cardiac time of death was 1234. Two RNs pronounced death, Joesph July RN an d Mcarthur Rossetti.

## 2023-11-22 DEATH — deceased
